# Patient Record
Sex: Male | Born: 1935 | Race: White | Hispanic: No | Marital: Married | Smoking: Never smoker
Health system: Southern US, Community
[De-identification: ages and names within clinical notes are randomized; demographics above are authoritative.]

## PROBLEM LIST (undated history)

## (undated) DIAGNOSIS — C801 Malignant (primary) neoplasm, unspecified: Secondary | ICD-10-CM

## (undated) DIAGNOSIS — I10 Essential (primary) hypertension: Secondary | ICD-10-CM

## (undated) HISTORY — PX: APPENDECTOMY: SHX54

---

## 2000-08-11 ENCOUNTER — Encounter (HOSPITAL_COMMUNITY): Admission: RE | Admit: 2000-08-11 | Discharge: 2000-11-09 | Payer: Self-pay | Admitting: Internal Medicine

## 2001-05-13 ENCOUNTER — Encounter (HOSPITAL_COMMUNITY): Admission: RE | Admit: 2001-05-13 | Discharge: 2001-08-11 | Payer: Self-pay | Admitting: Internal Medicine

## 2002-06-21 ENCOUNTER — Ambulatory Visit (HOSPITAL_COMMUNITY): Admission: RE | Admit: 2002-06-21 | Discharge: 2002-06-21 | Payer: Self-pay | Admitting: *Deleted

## 2002-06-21 ENCOUNTER — Encounter (INDEPENDENT_AMBULATORY_CARE_PROVIDER_SITE_OTHER): Payer: Self-pay | Admitting: *Deleted

## 2003-02-20 ENCOUNTER — Encounter (HOSPITAL_COMMUNITY): Admission: RE | Admit: 2003-02-20 | Discharge: 2003-05-21 | Payer: Self-pay | Admitting: Internal Medicine

## 2003-09-20 ENCOUNTER — Encounter (HOSPITAL_COMMUNITY): Admission: RE | Admit: 2003-09-20 | Discharge: 2003-10-01 | Payer: Self-pay | Admitting: Internal Medicine

## 2004-07-31 ENCOUNTER — Encounter (HOSPITAL_COMMUNITY): Admission: RE | Admit: 2004-07-31 | Discharge: 2004-10-29 | Payer: Self-pay | Admitting: Internal Medicine

## 2004-08-11 ENCOUNTER — Ambulatory Visit (HOSPITAL_COMMUNITY): Admission: RE | Admit: 2004-08-11 | Discharge: 2004-08-11 | Payer: Self-pay | Admitting: *Deleted

## 2004-08-11 ENCOUNTER — Encounter (INDEPENDENT_AMBULATORY_CARE_PROVIDER_SITE_OTHER): Payer: Self-pay | Admitting: *Deleted

## 2006-01-18 ENCOUNTER — Encounter (HOSPITAL_COMMUNITY): Admission: RE | Admit: 2006-01-18 | Discharge: 2006-04-14 | Payer: Self-pay | Admitting: Internal Medicine

## 2006-11-08 ENCOUNTER — Encounter (INDEPENDENT_AMBULATORY_CARE_PROVIDER_SITE_OTHER): Payer: Self-pay | Admitting: *Deleted

## 2006-11-08 ENCOUNTER — Ambulatory Visit (HOSPITAL_COMMUNITY): Admission: RE | Admit: 2006-11-08 | Discharge: 2006-11-08 | Payer: Self-pay | Admitting: *Deleted

## 2009-12-20 ENCOUNTER — Emergency Department (HOSPITAL_COMMUNITY): Admission: EM | Admit: 2009-12-20 | Discharge: 2009-12-20 | Payer: Self-pay | Admitting: Emergency Medicine

## 2010-06-17 NOTE — Op Note (Signed)
Matthew Hickman, BASTOS NO.:  [id number]   MEDICAL RECORD NO.:  [id number]          PATIENT TYPE:  AMB   LOCATION:  ENDO                         FACILITY:  [facility]   PHYSICIAN:  Georgiana Spinner, M.D.    DATE OF BIRTH:  1925/05/10   DATE OF PROCEDURE:  11/08/2006  DATE OF DISCHARGE:                               OPERATIVE REPORT   PROCEDURE:  Colonoscopy.   INDICATIONS:  Colon polyps.   ANESTHESIA:  1. Fentanyl 100 mcg.  2. Versed 10 mg.   PROCEDURE:  With the patient mildly sedated in the left lateral  decubitus position, a rectal examination was performed which was  unremarkable.  Subsequently, the Pentax videoscopic colonoscope was  inserted into the rectum, passed under direct vision to the cecum, with  pressure applied.  The cecum was identified by ileocecal valve and  appendiceal orifice, both of which were photographed.  From this point,  the colonoscope was slowly withdrawn, taking circumferential views of  the colonic mucosa, as we withdrew all the way to the rectum, stopping  at the splenic flexure, where a polyp was seen and removed first using  hot biopsy forceps technique setting of 20/150 blended current.  We next  stopped in the descending colon, where a second polyp was seen.  It too  was photographed, and it was removed using snare cautery technique with  the same setting.  Tissue was suctioned into the endoscope for retrieval  and retrieved in a tissue trap.  The endoscope was then withdrawn as  noted all the way to the rectum, which appeared normal on direct and  showed hemorrhoids on retroflexed view.  The endoscope was straightened  and withdrawn.  The patient's vital signs and pulse oximeter remained  stable.  The patient tolerated the procedure well, without apparent  complications.   FINDINGS:  Internal hemorrhoids.  Polyps of splenic flexure and  descending colon.  Otherwise an unremarkable exam.   PLAN:  Await biopsy report.  The  patient will call me for results and  follow up with me as an outpatient.           ______________________________  Georgiana Spinner, M.D.     GMO/MEDQ  D:  11/08/2006  T:  11/08/2006  Job:  [id number]

## 2010-06-20 NOTE — Op Note (Signed)
Matthew Hickman, KINER NO.:  [id number]   MEDICAL RECORD NO.:  [id number]          PATIENT TYPE:  AMB   LOCATION:  ENDO                         FACILITY:  [facility]   PHYSICIAN:  Georgiana Spinner, M.D.    DATE OF BIRTH:  Jun 24, 1925   DATE OF PROCEDURE:  08/11/2004  DATE OF DISCHARGE:                                 OPERATIVE REPORT   PROCEDURE:  Colonoscopy.   INDICATIONS:  Colon polyp.   ANESTHESIA:  Demerol 70 mg, Versed 4 mg.   PROCEDURE:  With the patient mildly sedated in the left lateral decubitus  position, a rectal exam was performed which was unremarkable.  Subsequently,  the Olympus videoscopic colonoscope was inserted in the rectum and passed  under direct vision to the cecum, identified by ileocecal valve and  appendiceal orifice, both of which were photographed.  From this point the  colonoscope was slowly withdrawn taking circumferential views of the colonic  mucosa, stopping only in the rectum, which appeared normal on direct and  retroflexed view other than we stopped at the splenic flexure, where there  was a small polyp seen.  It was photographed and it, too, was removed using  hot biopsy forceps technique, setting of 20/20 blended current.  The  endoscope was withdrawn.  The patient's vital signs and pulse oximetry  remained stable.  The patient tolerated procedure well without apparent  complications.   FINDINGS:  1.  Small polyp at splenic flexure.  2.  Internal hemorrhoids.  3.  Otherwise unremarkable colonoscopic examination.   PLAN:  Have the patient call me for results of biopsy and follow up with me  as an outpatient.       GMO/MEDQ  D:  08/11/2004  T:  08/11/2004  Job:  [id number]

## 2010-06-20 NOTE — Op Note (Signed)
   NAMEKHALEE, MAZO NO.:  [id number]   MEDICAL RECORD NO.:  [id number]                   PATIENT TYPE:  AMB   LOCATION:  ENDO                                 FACILITY:  [facility]   PHYSICIAN:  Georgiana Spinner, M.D.                 DATE OF BIRTH:  05/11/1925   DATE OF PROCEDURE:  06/21/2002  DATE OF DISCHARGE:                                 OPERATIVE REPORT   PROCEDURE:  Colonoscopy with biopsy.   INDICATIONS:  Colon polyps.   ANESTHESIA:  Demerol 20 mg, Versed 2.5 mg.   DESCRIPTION OF PROCEDURE:  With the patient mildly sedated in the left  lateral decubitus position, the Olympus videoscopic colonoscope was inserted  in the rectum after a normal rectal exam, passed under direct vision to the  cecum, identified by the ileocecal valve and appendiceal orifice, both of  which were photographed.  From this point the colonoscope was slowly  withdrawn, taking circumferential views and the entire colonic mucosa  visualized, stopping only first in the right colon, where two small polyps  were seen, photographed, and removed using hot biopsy forceps technique, and  then in the left colon and __________ descending colon, where another polyp  was seen.  It too was photographed and removed using hot biopsy forceps  technique, all on a setting of 20-20 blended current.  The rectum appeared  normal on direct and showed small internal hemorrhoids on retroflexed view.  The endoscope was straightened and withdrawn.  The patient's vital signs and  pulse oximetry remained stable.  The patient tolerated the procedure well  and without any complications.   FINDINGS:  Polyps of the colon as described.   PLAN:  Await biopsy report.  The patient will call me for results and follow  up with me as an outpatient.                                               Georgiana Spinner, M.D.    GMO/MEDQ  D:  06/21/2002  T:  06/21/2002  Job:  [id number]

## 2010-06-20 NOTE — Op Note (Signed)
   Matthew Hickman, HILTZ NO.:  [id number]   MEDICAL RECORD NO.:  [id number]                   PATIENT TYPE:  AMB   LOCATION:  ENDO                                 FACILITY:  [facility]   PHYSICIAN:  Georgiana Spinner, M.D.                 DATE OF BIRTH:  Nov 24, 1925   DATE OF PROCEDURE:  06/21/2002  DATE OF DISCHARGE:                                 OPERATIVE REPORT   PROCEDURE:  Upper endoscopy.   INDICATIONS FOR PROCEDURE:  Gastroesophageal reflux disease.   ANESTHESIA:  Demerol 70, Versed 7.5 mg.   DESCRIPTION OF PROCEDURE:  With the patient mildly sedated in the left  lateral decubitus position, the Olympus videoscopic endoscope was inserted  in the mouth and passed under direct vision through the esophagus which  appeared normal into the stomach. The fundus and body appeared normal. The  antrum showed changes of erythema and thickened folds in the prepyloric area  which were photographed and biopsied. The duodenal bulb and second portion  of the duodenum appeared normal. From this point, the endoscope was slowly  withdrawn taking circumferential views of the duodenal mucosa until the  endoscope was then pulled back in the stomach, placed in retroflexion to  view the stomach from below. The endoscope was then straightened and  withdrawn taking circumferential views of the remaining gastric and  esophageal mucosa. The patient's vital signs and pulse oximeter remained  stable. The patient tolerated the procedure well without apparent  complications.   FINDINGS:  Thickened folds of the antrum. Await biopsy report. The patient  will call me for results and followup with me as an outpatient. Proceed to  colonoscopy as planned.                                               Georgiana Spinner, M.D.    GMO/MEDQ  D:  06/21/2002  T:  06/21/2002  Job:  [id number]

## 2010-07-21 ENCOUNTER — Other Ambulatory Visit: Payer: Self-pay | Admitting: Dermatology

## 2010-09-17 ENCOUNTER — Emergency Department (HOSPITAL_COMMUNITY): Payer: Medicare Other

## 2010-09-17 ENCOUNTER — Other Ambulatory Visit (INDEPENDENT_AMBULATORY_CARE_PROVIDER_SITE_OTHER): Payer: Self-pay | Admitting: Surgery

## 2010-09-17 ENCOUNTER — Encounter (HOSPITAL_COMMUNITY): Payer: Self-pay | Admitting: Radiology

## 2010-09-17 ENCOUNTER — Inpatient Hospital Stay (HOSPITAL_COMMUNITY)
Admission: EM | Admit: 2010-09-17 | Discharge: 2010-09-18 | DRG: 343 | Disposition: A | Discharge status: Home / Self Care | Payer: Medicare Other | Attending: Surgery | Admitting: Surgery

## 2010-09-17 DIAGNOSIS — N400 Enlarged prostate without lower urinary tract symptoms: Secondary | ICD-10-CM | POA: Diagnosis present

## 2010-09-17 DIAGNOSIS — N289 Disorder of kidney and ureter, unspecified: Secondary | ICD-10-CM | POA: Diagnosis present

## 2010-09-17 DIAGNOSIS — I10 Essential (primary) hypertension: Secondary | ICD-10-CM | POA: Diagnosis present

## 2010-09-17 DIAGNOSIS — M47812 Spondylosis without myelopathy or radiculopathy, cervical region: Secondary | ICD-10-CM | POA: Diagnosis present

## 2010-09-17 DIAGNOSIS — Z7982 Long term (current) use of aspirin: Secondary | ICD-10-CM

## 2010-09-17 DIAGNOSIS — F1010 Alcohol abuse, uncomplicated: Secondary | ICD-10-CM | POA: Diagnosis present

## 2010-09-17 DIAGNOSIS — Z79899 Other long term (current) drug therapy: Secondary | ICD-10-CM

## 2010-09-17 DIAGNOSIS — R000 Tachycardia, unspecified: Secondary | ICD-10-CM | POA: Diagnosis present

## 2010-09-17 DIAGNOSIS — R1031 Right lower quadrant pain: Secondary | ICD-10-CM

## 2010-09-17 DIAGNOSIS — K3580 Unspecified acute appendicitis: Secondary | ICD-10-CM

## 2010-09-17 HISTORY — DX: Essential (primary) hypertension: I10

## 2010-09-17 LAB — URINE MICROSCOPIC-ADD ON

## 2010-09-17 LAB — COMPREHENSIVE METABOLIC PANEL
ALT: 22 U/L (ref 0–53)
AST: 28 U/L (ref 0–37)
Albumin: 3.5 g/dL (ref 3.5–5.2)
Alkaline Phosphatase: 78 U/L (ref 39–117)
BUN: 17 mg/dL (ref 6–23)
CO2: 28 mEq/L (ref 19–32)
Calcium: 9.5 mg/dL (ref 8.4–10.5)
Chloride: 96 mEq/L (ref 96–112)
Creatinine, Ser: 0.62 mg/dL (ref 0.50–1.35)
GFR calc Af Amer: >60 mL/min (ref >60)
GFR calc non Af Amer: >60 mL/min (ref >60)
Glucose, Bld: 127 mg/dL — ABNORMAL HIGH (ref 70–99)
Potassium: 3.8 mEq/L (ref 3.5–5.1)
Sodium: 134 mEq/L — ABNORMAL LOW (ref 135–145)
Total Bilirubin: 1 mg/dL (ref 0.3–1.2)
Total Protein: 6.8 g/dL (ref 6.0–8.3)

## 2010-09-17 LAB — DIFFERENTIAL
Basophils Absolute: 0 10*3/uL (ref 0.0–0.1)
Basophils Relative: 0 % (ref 0–1)
Eosinophils Absolute: 0 10*3/uL (ref 0.0–0.7)
Eosinophils Relative: 0 % (ref 0–5)
Lymphocytes Relative: 8 % — ABNORMAL LOW (ref 12–46)
Lymphs Abs: 1 10*3/uL (ref 0.7–4.0)
Monocytes Absolute: 1.3 10*3/uL — ABNORMAL HIGH (ref 0.1–1.0)
Monocytes Relative: 11 % (ref 3–12)
Neutro Abs: 9.5 10*3/uL — ABNORMAL HIGH (ref 1.7–7.7)
Neutrophils Relative %: 81 % — ABNORMAL HIGH (ref 43–77)

## 2010-09-17 LAB — CBC
HCT: 50.4 % (ref 39.0–52.0)
Hemoglobin: 18.2 g/dL — ABNORMAL HIGH (ref 13.0–17.0)
MCH: 35.6 pg — ABNORMAL HIGH (ref 26.0–34.0)
MCHC: 36.1 g/dL — ABNORMAL HIGH (ref 30.0–36.0)
MCV: 98.6 fL (ref 78.0–100.0)
Platelets: 101 10*3/uL — ABNORMAL LOW (ref 150–400)
RBC: 5.11 MIL/uL (ref 4.22–5.81)
RDW: 13.7 % (ref 11.5–15.5)
WBC: 11.8 10*3/uL — ABNORMAL HIGH (ref 4.0–10.5)

## 2010-09-17 LAB — URINALYSIS, ROUTINE W REFLEX MICROSCOPIC
Glucose, UA: NEGATIVE mg/dL
Ketones, ur: >80 mg/dL — AB
Nitrite: NEGATIVE
Protein, ur: NEGATIVE mg/dL
Specific Gravity, Urine: 1.022 (ref 1.005–1.030)
Urobilinogen, UA: 0.2 mg/dL (ref 0.0–1.0)
pH: 5.5 (ref 5.0–8.0)

## 2010-09-17 LAB — LACTIC ACID, PLASMA: Lactic Acid, Venous: 1.3 mmol/L (ref 0.5–2.2)

## 2010-09-17 LAB — LIPASE, BLOOD: Lipase: 20 U/L (ref 11–59)

## 2010-09-17 MED ORDER — IOHEXOL 300 MG/ML  SOLN
100.0000 mL | Freq: Once | INTRAMUSCULAR | Status: AC | PRN
  Administered 2010-09-17: 100 mL via INTRAVENOUS

## 2010-09-20 ENCOUNTER — Emergency Department (HOSPITAL_COMMUNITY)
Admission: EM | Admit: 2010-09-20 | Discharge: 2010-09-20 | Disposition: A | Discharge status: Home / Self Care | Payer: Medicare Other | Attending: Emergency Medicine | Admitting: Emergency Medicine

## 2010-09-20 DIAGNOSIS — N390 Urinary tract infection, site not specified: Secondary | ICD-10-CM | POA: Insufficient documentation

## 2010-09-20 DIAGNOSIS — Z9089 Acquired absence of other organs: Secondary | ICD-10-CM | POA: Insufficient documentation

## 2010-09-20 DIAGNOSIS — R339 Retention of urine, unspecified: Secondary | ICD-10-CM | POA: Insufficient documentation

## 2010-09-20 LAB — POCT I-STAT, CHEM 8
BUN: 20 mg/dL (ref 6–23)
Calcium, Ion: 1.16 mmol/L (ref 1.12–1.32)
Chloride: 101 mEq/L (ref 96–112)
Creatinine, Ser: 0.7 mg/dL (ref 0.50–1.35)
Glucose, Bld: 102 mg/dL — ABNORMAL HIGH (ref 70–99)
HCT: 50 % (ref 39.0–52.0)
Hemoglobin: 17 g/dL (ref 13.0–17.0)
Potassium: 3.5 mEq/L (ref 3.5–5.1)
Sodium: 139 mEq/L (ref 135–145)
TCO2: 28 mmol/L (ref 0–100)

## 2010-09-20 LAB — URINALYSIS, ROUTINE W REFLEX MICROSCOPIC
Bilirubin Urine: NEGATIVE
Glucose, UA: NEGATIVE mg/dL
Hgb urine dipstick: NEGATIVE
Ketones, ur: NEGATIVE mg/dL
Nitrite: POSITIVE — AB
Protein, ur: NEGATIVE mg/dL
Specific Gravity, Urine: 1.015 (ref 1.005–1.030)
Urobilinogen, UA: 1 mg/dL (ref 0.0–1.0)
pH: 7 (ref 5.0–8.0)

## 2010-09-20 LAB — URINE MICROSCOPIC-ADD ON

## 2010-09-21 LAB — URINE CULTURE
Colony Count: NO GROWTH
Culture  Setup Time: 201208181733
Culture: NO GROWTH

## 2010-09-23 NOTE — Op Note (Signed)
[name], [name] NO.:  [id number]  MEDICAL RECORD NO.:  [id number]  LOCATION:  1523                         FACILITY:  [facility]  PHYSICIAN:  Matthew Sportsman, MD     DATE OF BIRTH:  05/26/1925  DATE OF PROCEDURE:  09/17/2010 DATE OF DISCHARGE:                              OPERATIVE REPORT   PRIMARY CARE PHYSICIAN:  Massie Maroon, MD  SURGEON:  Matthew Sportsman, MD  ASSISTANT:  RN  PREOPERATIVE DIAGNOSES:  Acute appendicitis.  POSTOPERATIVE DIAGNOSES:  Acute suppurative appendicitis, probably early perforated.  PROCEDURE PERFORMED:  Diagnostic laparoscopy with appendectomy.  ANESTHESIA: 1. General. 2. Local anesthetic and a field block around port sites.  SPECIMENS:  Appendix.  DRAINS:  None.  ESTIMATED BLOOD LOSS:  Minimal.  COMPLICATIONS:  None apparent.  INDICATION:  Matthew Hickman is an 77 year old heavy drinker with 24-hour history of abdominal pain, it began more progressive in right lower quadrant with nausea.  History and physical, and CT scan findings were strongly suspicious for acute appendicitis.  The anatomy and physiology of the digestive tract was explained.  The pathophysiology of acute appendicitis was discussed.  Options were discussed.  Recommendation was made for diagnostic laparoscopy with appendectomy.  Laparoscopic verus open was discussed.  Risks, benefits, and alternatives were discussed.  Natural history and risks without surgery were discussed as well.  Questions were answered and he agreed to proceed.  OPERATIVE FINDINGS:  He had a moderately inflamed appendix that was diffusely thickened with separation and phlegmon on it.  There was no discrete abscesses.  Rest of his small intestine otherwise looked normal with no obvious terminal ileitis or colon or pelvic abnormalities.  No obvious hernia.  DESCRIPTION OF PROCEDURE:  Informed consent was confirmed.  The patient had already intravenous Invanz.  He underwent general  anesthesia without any difficulty.  He was positioned supine with arms tucked.  He had voided prior to coming to the operating room.  His abdomen was clipped, prepped, and draped in sterile fashion.  Surgical time out confirmed our plan.  I placed a #5 mm port through the inferior part of the umbilicus doing a cutdown in modified Hasson technique.  Entry was clean.  I induced carbon dioxide insufflation.  Camera inspection revealed no intraabdominal injury.  I placed a 5 mm port in the left suprapubic region.  I rolled over the cecum and saw an inflamed appendix running along the pericolic gutter over the pelvic brim.  I upsized the umbilical port to 12-mm port and placed a 5 mm port in the left lower quadrant.  I elevated the appendix, I was able to mobilize it over the cephalad.  I made a window at the mesentry of the mesoappendix close to the epiphyseal base.  I stapled the appendix of the cecum using laparoscopic blue load stapler and ligated the mesoappendix using 0-Endoloop PDS.  I then transected the appendix and also ligated mesoappendix using cautery tip scissors to get it resolved.  Because the appendix was rather inflamed and phlegmonous, I placed an Endocatch bag and removed out the umbilical port.  I did copious irrigation over 3 liter saline in subhepatic space,  pelvis, and especially along the paracolic gutter.  Aspiration was clear at the end.  Hemostasis was excellent.  Staple line was clean with no bleeding.  Therefore, I evacuated carbon dioxide and removed the ports.  I closed the wounds using 0-Vicryl fascial stitches at the umbilicus and closed the skin using 4-0 Monocryl stitch.  I placed sterile dressing.  The patient is being extubated to recovery room.  I discussed the postoperative care with the patient in the ER prior to surgery.  Family was not available for surgery.  I will look for them now.     Matthew Sportsman, MD     SCG/MEDQ  D:   09/17/2010  T:  09/18/2010  Job:  [id number]  cc:   Massie Maroon, MD Fax: [phone]  Electronically Signed by Karie Soda MD on 09/23/2010 01:55:22 PM

## 2010-10-02 NOTE — Discharge Summary (Signed)
  Matthew Hickman, Matthew Hickman NO.:  [id number]  MEDICAL RECORD NO.:  [id number]  LOCATION:  1523                         FACILITY:  [facility]  PHYSICIAN:  Ardeth Sportsman, MD     DATE OF BIRTH:  Jul 20, 1925  DATE OF ADMISSION:  09/17/2010 DATE OF DISCHARGE:  09/18/2010                              DISCHARGE SUMMARY   PRIMARY CARE PHYSICIAN:  Massie Maroon, MD  GENERAL SURGEON:  Ardeth Sportsman, MD  HISTORY OF PRESENT ILLNESS:  Matthew Hickman is a pleasant 77 year old gentleman with history of hypertension and alcohol abuse who presented with abdominal pain, was found to have evidence of appendicitis on CT scan.  Incidental finding on the CT showed a small indeterminate 9 mm lesion emanating from the lower pole of the left kidney, again of uncertain etiology.  The patient does have a prominent prostate on the CT as well.  However, after evaluation of the patient, decision was made to admit the patient for surgical intervention.  SUMMARY OF HOSPITAL COURSE:  The patient was admitted on September 17, 2010, was taken to the operating room on same day, underwent laparoscopic appendectomy.  Postoperatively, the patient is voiding okay, passing flatus, and has tolerated regular diet without any significant nausea, vomiting.  His pain control is good with oral analgesics.  We feel he is appropriate for discharge at this time.  DISCHARGE DIAGNOSES: 1. Acute appendicitis, status post laparoscopic appendectomy. 2. Hypertension. 3. Chronic alcohol abuse. 4. Probable prostatic hypertrophy. 5. Left kidney lesion found on CT.  DISCHARGE MEDICATIONS:  The patient can resume home medications including: 1. Aspirin 81 mg daily. 2. Hydrochlorothiazide 25 mg daily. 3. Metoprolol 25 mg 2 tablets q.a.m. 4. Multivitamin once daily. 5. Prescription for Norco 5/325 one to two tablets q.4-6 hours p.r.n.     pain.  He is asked to follow up in our clinic for reevaluation in approximately 2 weeks'  time.  He is asked to follow up with his primary care physician, Dr. Selena Batten, for his routinely scheduled annual physical.     [name], PA-C   ______________________________ Ardeth Sportsman, MD    KB/MEDQ  D:  09/18/2010  T:  09/19/2010  Job:  [id number]  cc:   Massie Maroon, MD Fax: [phone]  Electronically Signed by [name]  on 09/29/2010 11:56:10 AM Electronically Signed by Karie Soda MD on 10/02/2010 08:17:31 AM

## 2010-10-02 NOTE — H&P (Signed)
[name], Hickman NO.:  [id number]  MEDICAL RECORD NO.:  [id number]  LOCATION:  0098                         FACILITY:  [facility]  PHYSICIAN:  Ardeth Sportsman, MD     DATE OF BIRTH:  May 16, 1925  DATE OF ADMISSION:  09/17/2010 DATE OF DISCHARGE:                             HISTORY & PHYSICAL   CHIEF COMPLAINT:  Abdominal pain.  BRIEF HISTORY:  The patient is a healthy 77 year old white male who developed pain in his right lower quadrant last night.  He could not sleep, it kept him up all night.  No nausea, vomiting, diarrhea, or constipation.  No blood in his stool.  No fever.  He presented to the ER, still having pain.  He has been treated with morphine for his pain and that is under control.  Workup included a CT, which shows atelectasis in the right lower lobe.  There is a 9-mm lesion in lower pole of the left kidney.  The appendix is thickened at 11 mm with periappendiceal stranding consistent with acute appendicitis.  His prostate is also 62 x 53 mm.  We are asked to see in consultation.  PAST MEDICAL HISTORY: 1. He has a x-ray, which shows cervical spondylosis, November 2011. 2. High blood pressure for 26 years. 3. He is hard of hearing and this was attributed to a blood clot some     years ago, which affected his hearing.  PAST SURGICAL HISTORY: 1. He has had a vasectomy. 2. He has had several endoscopies and the last in 2008, by Dr. Virginia Rochester was     essentially normal.  FAMILY HISTORY:  Father died at 33 with congestive heart failure. Mother, poor health with a history of MI at 60 and 2 brothers, one down with lung cancer and one with brain cancer.  Two sisters with cancer and one in good health.  SOCIAL HISTORY:  He has never smoked.  Alcohol 1.75 L of bourbon per week for the last 14 years.  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  CURRENT MEDICATIONS: 1. Hydrochlorothiazide 25 mg daily. 2. Metoprolol 25 mg p.o. b.i.d.  REVIEW  OF SYSTEMS:  CV:  Negative for headache, dizziness, syncope, stroke, or seizure.  PULMONARY:  Positive for some dyspnea on exertion. No coughing, wheezing.  No URIs.  No history of orthopnea or PND.  GI: Negative for nausea, vomiting, diarrhea, constipation, or blood. CARDIAC:  No history of chest pain or palpitations.  No history of heart failure.  GU:  Little slow voiding sometimes, otherwise normal.  LOWER EXTREMITIES:  Positive for occasional edema in his feet.  He has pain in his feet, but not in his legs.  SKIN:  He has had skin cancers removed from his face.  ENDOCRINE:  Negative.  PSYCHIATRIC:  Negative.  ALLERGIES:  None known.  He says he does not tolerate GINGER ALE.  PHYSICAL EXAMINATION:  GENERAL:  This is a well-nourished, well- developed white male, in no acute distress. VITAL SIGNS:  Temperature recorded as 98.3, he feels warmer than that now, heart rate was 126 on admission, blood pressure 150/82, respiratory rate is 18, saturation is 97%  on room air. HEENT:  Head:  Normocephalic.  Ears:  Extremely hard-of-hearing.  Nose, throat, and mouth mucosa is normal.  Trachea is midline.  Thyroid is nonpalpable.  No JVD.  No bruits. CHEST:  Clear.  Upper lobes, he has bilateral basilar rales. CARDIAC:  Normal S1 and S2.  No murmurs or rub were heard.  Chest wall is nontender. ABDOMEN:  Bowel sounds were present.  He is slightly distended, tender most especially in the right lower quadrant.  No hernias, masses, or abscesses noted. GU/RECTAL:  Deferred. MUSCULOSKELETAL:  No changes. SKIN:  No changes. NEUROLOGIC:  Cranial nerves II through XII grossly intact.  No focal changes. PSYCHIATRIC:  Normal affect.  LABORATORY DATA:  White count 11.8, hemoglobin is 18, hematocrit is 50, and platelets are 101,000.  UA is positive for ketones, 11-20 white cells per high-powered field, lactic acid 1.3, and lipase 20.  Sodium of 134, potassium 3.8, chloride 96, CO2 of 28, BUN is 17,  creatinine is 0.62, and glucose 127.  EKG shows sinus rhythm, heart rate of 120.  PR interval 162 milliseconds.  QTc of 432 milliseconds with occasional PVCs.  CT as described above.  IMPRESSION: 1. Acute appendicitis. 2. Hypertension. 3. Alcohol use. 4. Probable urinary tract infection.  Questionable abnormality left     lower pole of the kidney.  PLAN:  We are going to take him to the operating room for appendectomy as soon as he is back from x-ray.  Further workup and treatment as indicated.     Eber Hong, P.A.   ______________________________ Ardeth Sportsman, MD    WDJ/MEDQ  D:  09/17/2010  T:  09/17/2010  Job:  [id number]  cc:   Eber Hong, P.A.  Electronically Signed by Sherrie Aviv P.A. on 09/25/2010 09:08:45 PM Electronically Signed by Karie Soda MD on 10/02/2010 08:17:24 AM

## 2010-10-07 ENCOUNTER — Ambulatory Visit (INDEPENDENT_AMBULATORY_CARE_PROVIDER_SITE_OTHER): Payer: Medicare Other | Admitting: General Surgery

## 2010-10-07 ENCOUNTER — Encounter (INDEPENDENT_AMBULATORY_CARE_PROVIDER_SITE_OTHER): Payer: Self-pay | Admitting: General Surgery

## 2010-10-07 VITALS — BP 134/82

## 2010-10-07 DIAGNOSIS — Z9889 Other specified postprocedural states: Secondary | ICD-10-CM

## 2010-10-07 DIAGNOSIS — Z9049 Acquired absence of other specified parts of digestive tract: Secondary | ICD-10-CM

## 2010-10-07 NOTE — Progress Notes (Signed)
History of Present Illness: Matthew Hickman is a  77 y.o. male who presents today status post lap appy.  Pathology reveals acute appendicitis.  The patient is tolerating a regular diet, having normal bowel movements, has good pain control.  He  is back to most normal activities.   Physical Exam: Abd: soft, nontender, active bowel sounds, nondistended.  All incisions are well healed.  Impression: 1.  Acute appendicitis, s/p lap appy  Plan: He  is able to return to normal activities. He  may follow up on a prn basis.

## 2010-10-09 ENCOUNTER — Encounter (INDEPENDENT_AMBULATORY_CARE_PROVIDER_SITE_OTHER): Payer: Medicare Other | Admitting: Surgery

## 2011-03-18 DIAGNOSIS — N401 Enlarged prostate with lower urinary tract symptoms: Secondary | ICD-10-CM | POA: Diagnosis not present

## 2011-03-18 DIAGNOSIS — R339 Retention of urine, unspecified: Secondary | ICD-10-CM | POA: Diagnosis not present

## 2011-03-18 DIAGNOSIS — N281 Cyst of kidney, acquired: Secondary | ICD-10-CM | POA: Diagnosis not present

## 2011-08-17 ENCOUNTER — Other Ambulatory Visit: Payer: Self-pay | Admitting: Surgery

## 2011-08-17 DIAGNOSIS — C44320 Squamous cell carcinoma of skin of unspecified parts of face: Secondary | ICD-10-CM | POA: Diagnosis not present

## 2011-09-10 DIAGNOSIS — I10 Essential (primary) hypertension: Secondary | ICD-10-CM | POA: Diagnosis not present

## 2011-09-10 DIAGNOSIS — R972 Elevated prostate specific antigen [PSA]: Secondary | ICD-10-CM | POA: Diagnosis not present

## 2011-09-10 DIAGNOSIS — D45 Polycythemia vera: Secondary | ICD-10-CM | POA: Diagnosis not present

## 2011-09-10 DIAGNOSIS — D649 Anemia, unspecified: Secondary | ICD-10-CM | POA: Diagnosis not present

## 2011-09-10 DIAGNOSIS — C4490 Unspecified malignant neoplasm of skin, unspecified: Secondary | ICD-10-CM | POA: Diagnosis not present

## 2011-09-24 DIAGNOSIS — D485 Neoplasm of uncertain behavior of skin: Secondary | ICD-10-CM | POA: Diagnosis not present

## 2011-09-24 DIAGNOSIS — R238 Other skin changes: Secondary | ICD-10-CM | POA: Diagnosis not present

## 2011-09-24 DIAGNOSIS — C44320 Squamous cell carcinoma of skin of unspecified parts of face: Secondary | ICD-10-CM | POA: Diagnosis not present

## 2011-11-05 DIAGNOSIS — Z23 Encounter for immunization: Secondary | ICD-10-CM | POA: Diagnosis not present

## 2012-03-01 DIAGNOSIS — Z961 Presence of intraocular lens: Secondary | ICD-10-CM | POA: Diagnosis not present

## 2012-03-07 DIAGNOSIS — I10 Essential (primary) hypertension: Secondary | ICD-10-CM | POA: Diagnosis not present

## 2012-03-14 DIAGNOSIS — D45 Polycythemia vera: Secondary | ICD-10-CM | POA: Diagnosis not present

## 2012-03-14 DIAGNOSIS — R972 Elevated prostate specific antigen [PSA]: Secondary | ICD-10-CM | POA: Diagnosis not present

## 2012-03-14 DIAGNOSIS — I10 Essential (primary) hypertension: Secondary | ICD-10-CM | POA: Diagnosis not present

## 2012-03-21 DIAGNOSIS — N401 Enlarged prostate with lower urinary tract symptoms: Secondary | ICD-10-CM | POA: Diagnosis not present

## 2012-03-21 DIAGNOSIS — N390 Urinary tract infection, site not specified: Secondary | ICD-10-CM | POA: Diagnosis not present

## 2012-09-12 DIAGNOSIS — D45 Polycythemia vera: Secondary | ICD-10-CM | POA: Diagnosis not present

## 2012-09-12 DIAGNOSIS — I10 Essential (primary) hypertension: Secondary | ICD-10-CM | POA: Diagnosis not present

## 2012-09-19 DIAGNOSIS — D45 Polycythemia vera: Secondary | ICD-10-CM | POA: Diagnosis not present

## 2012-09-19 DIAGNOSIS — I10 Essential (primary) hypertension: Secondary | ICD-10-CM | POA: Diagnosis not present

## 2012-09-19 DIAGNOSIS — R972 Elevated prostate specific antigen [PSA]: Secondary | ICD-10-CM | POA: Diagnosis not present

## 2012-09-19 DIAGNOSIS — C4490 Unspecified malignant neoplasm of skin, unspecified: Secondary | ICD-10-CM | POA: Diagnosis not present

## 2012-10-18 DIAGNOSIS — Z23 Encounter for immunization: Secondary | ICD-10-CM | POA: Diagnosis not present

## 2013-01-09 DIAGNOSIS — L821 Other seborrheic keratosis: Secondary | ICD-10-CM | POA: Diagnosis not present

## 2013-01-09 DIAGNOSIS — D2330 Other benign neoplasm of skin of unspecified part of face: Secondary | ICD-10-CM | POA: Diagnosis not present

## 2013-01-09 DIAGNOSIS — D485 Neoplasm of uncertain behavior of skin: Secondary | ICD-10-CM | POA: Diagnosis not present

## 2013-01-09 DIAGNOSIS — Z85828 Personal history of other malignant neoplasm of skin: Secondary | ICD-10-CM | POA: Diagnosis not present

## 2013-06-05 DIAGNOSIS — N139 Obstructive and reflux uropathy, unspecified: Secondary | ICD-10-CM | POA: Diagnosis not present

## 2013-06-05 DIAGNOSIS — N281 Cyst of kidney, acquired: Secondary | ICD-10-CM | POA: Diagnosis not present

## 2013-06-05 DIAGNOSIS — N401 Enlarged prostate with lower urinary tract symptoms: Secondary | ICD-10-CM | POA: Diagnosis not present

## 2013-06-05 DIAGNOSIS — D4959 Neoplasm of unspecified behavior of other genitourinary organ: Secondary | ICD-10-CM | POA: Diagnosis not present

## 2013-09-14 DIAGNOSIS — R972 Elevated prostate specific antigen [PSA]: Secondary | ICD-10-CM | POA: Diagnosis not present

## 2013-09-14 DIAGNOSIS — R5381 Other malaise: Secondary | ICD-10-CM | POA: Diagnosis not present

## 2013-09-14 DIAGNOSIS — I10 Essential (primary) hypertension: Secondary | ICD-10-CM | POA: Diagnosis not present

## 2013-09-14 DIAGNOSIS — R5383 Other fatigue: Secondary | ICD-10-CM | POA: Diagnosis not present

## 2013-10-21 DIAGNOSIS — Z23 Encounter for immunization: Secondary | ICD-10-CM | POA: Diagnosis not present

## 2013-11-22 DIAGNOSIS — R972 Elevated prostate specific antigen [PSA]: Secondary | ICD-10-CM | POA: Diagnosis not present

## 2013-11-22 DIAGNOSIS — I10 Essential (primary) hypertension: Secondary | ICD-10-CM | POA: Diagnosis not present

## 2013-11-22 DIAGNOSIS — D45 Polycythemia vera: Secondary | ICD-10-CM | POA: Diagnosis not present

## 2014-09-17 DIAGNOSIS — D45 Polycythemia vera: Secondary | ICD-10-CM | POA: Diagnosis not present

## 2014-09-17 DIAGNOSIS — R972 Elevated prostate specific antigen [PSA]: Secondary | ICD-10-CM | POA: Diagnosis not present

## 2014-09-17 DIAGNOSIS — I10 Essential (primary) hypertension: Secondary | ICD-10-CM | POA: Diagnosis not present

## 2014-09-20 DIAGNOSIS — R972 Elevated prostate specific antigen [PSA]: Secondary | ICD-10-CM | POA: Diagnosis not present

## 2014-09-20 DIAGNOSIS — I10 Essential (primary) hypertension: Secondary | ICD-10-CM | POA: Diagnosis not present

## 2014-09-20 DIAGNOSIS — D45 Polycythemia vera: Secondary | ICD-10-CM | POA: Diagnosis not present

## 2014-09-25 ENCOUNTER — Emergency Department (HOSPITAL_COMMUNITY)
Admission: EM | Admit: 2014-09-25 | Discharge: 2014-09-25 | Disposition: A | Discharge status: Home / Self Care | Payer: Medicare Other | Attending: Emergency Medicine | Admitting: Emergency Medicine

## 2014-09-25 ENCOUNTER — Encounter (HOSPITAL_COMMUNITY): Payer: Self-pay | Admitting: *Deleted

## 2014-09-25 ENCOUNTER — Emergency Department (HOSPITAL_COMMUNITY): Payer: Medicare Other

## 2014-09-25 DIAGNOSIS — I10 Essential (primary) hypertension: Secondary | ICD-10-CM | POA: Diagnosis not present

## 2014-09-25 DIAGNOSIS — Y998 Other external cause status: Secondary | ICD-10-CM | POA: Diagnosis not present

## 2014-09-25 DIAGNOSIS — Y9289 Other specified places as the place of occurrence of the external cause: Secondary | ICD-10-CM | POA: Insufficient documentation

## 2014-09-25 DIAGNOSIS — W1839XA Other fall on same level, initial encounter: Secondary | ICD-10-CM | POA: Diagnosis not present

## 2014-09-25 DIAGNOSIS — Z79899 Other long term (current) drug therapy: Secondary | ICD-10-CM | POA: Insufficient documentation

## 2014-09-25 DIAGNOSIS — M545 Low back pain, unspecified: Secondary | ICD-10-CM

## 2014-09-25 DIAGNOSIS — S79911A Unspecified injury of right hip, initial encounter: Secondary | ICD-10-CM | POA: Diagnosis present

## 2014-09-25 DIAGNOSIS — Z7982 Long term (current) use of aspirin: Secondary | ICD-10-CM | POA: Diagnosis not present

## 2014-09-25 DIAGNOSIS — Y9389 Activity, other specified: Secondary | ICD-10-CM | POA: Insufficient documentation

## 2014-09-25 DIAGNOSIS — M25551 Pain in right hip: Secondary | ICD-10-CM | POA: Diagnosis not present

## 2014-09-25 DIAGNOSIS — S3992XA Unspecified injury of lower back, initial encounter: Secondary | ICD-10-CM | POA: Diagnosis not present

## 2014-09-25 NOTE — ED Notes (Signed)
Returned from xray

## 2014-09-25 NOTE — ED Provider Notes (Signed)
CSN: [id number]     Arrival date & time 09/25/14  0917 History   First MD Initiated Contact with Patient 09/25/14 1024     Chief Complaint  Patient presents with  . Hip Pain     (Consider location/radiation/quality/duration/timing/severity/associated sxs/prior Treatment) HPI   Pt p/w right hip/lower back pain that began about 5 days ago.  States he was in his yard crouched under a bush doing yard work when he lost his balance and accidentally tipped over onto his right side, landing primarily on his right elbow.  States his right hip started hurting two days later.  Has taken nothing for the pain.  Decided to have it checked out this morning because he was having difficulty getting out of bed.  Denies fevers, chills, abdominal pain, loss of control of bowel or bladder, weakness of numbness of the extremities, saddle anesthesia, bowel or urinary complaints.    Past Medical History  Diagnosis Date  . Hypertension    Past Surgical History  Procedure Laterality Date  . Appendectomy     No family history on file. Social History  Substance Use Topics  . Smoking status: Never Smoker   . Smokeless tobacco: None  . Alcohol Use: None    Review of Systems  All other systems reviewed and are negative.     Allergies  Review of patient's allergies indicates no known allergies.  Home Medications   Prior to Admission medications   Medication Sig Start Date End Date Taking? Authorizing Provider  aspirin 81 MG tablet Take 81 mg by mouth daily.      Historical Provider, MD  hydrochlorothiazide 25 MG tablet Take 25 mg by mouth daily.      Historical Provider, MD  metoprolol (TOPROL-XL) 50 MG 24 hr tablet Take 50 mg by mouth daily.      Historical Provider, MD  Pediatric Multivitamins-Fl (MULTI VIT/FL PO) Take by mouth.      Historical Provider, MD  silodosin (RAPAFLO) 8 MG CAPS capsule Take 8 mg by mouth daily with breakfast.      Historical Provider, MD   BP 158/92 mmHg  Pulse 80   Temp(Src) 97.6 F (36.4 C) (Oral)  Resp 16  Ht 5\' 11"  (1.803 m)  Wt 142 lb (64.411 kg)  BMI 19.81 kg/m2  SpO2 98% Physical Exam  Constitutional: He appears well-developed and well-nourished. No distress.  HENT:  Head: Normocephalic and atraumatic.  Neck: Neck supple.  Cardiovascular: Normal rate and regular rhythm.   Pulmonary/Chest: Effort normal and breath sounds normal. No respiratory distress. He has no wheezes. He has no rales.  Abdominal: Soft. He exhibits no distension and no mass. There is no tenderness. There is no rebound and no guarding.  Musculoskeletal:       Back:  Spine nontender, no crepitus, or stepoffs. Lower extremities:  Strength 5/5, sensation intact, distal pulses intact.    No other bony tenderness noted throughout right side.    Neurological: He is alert. He exhibits normal muscle tone.  Skin: He is not diaphoretic.  Nursing note and vitals reviewed.   ED Course  Procedures (including critical care time) Labs Review Labs Reviewed - No data to display  Imaging Review Dg Hip Unilat  With Pelvis 2-3 Views Right  09/25/2014   CLINICAL DATA:  Right hip pain for 7 days.  Fell 10 days ago.  EXAM: DG HIP (WITH OR WITHOUT PELVIS) 2-3V RIGHT  COMPARISON:  CT 09/17/2010  FINDINGS: Irregular appearance of the right L5  transverse process appears similar to the prior examination. The pelvic bony ring is intact. Right hip is located without acute fracture.  IMPRESSION: No acute abnormality.   Electronically Signed   By: Markus Daft M.D.   On: 09/25/2014 10:58   I have personally reviewed and evaluated these images and lab results as part of my medical decision-making.   EKG Interpretation None       Discussed pt with Dr Dayna Barker.      MDM   Final diagnoses:  Right-sided low back pain without sciatica    Afebrile, nontoxic patient with right lower back pain after tipping over while crouched on the ground last week.  Reproducible with palpation.  Pt without red  flags or other concerning symptoms.  No requiring pain medications.  Ambulates without difficulty.   D/C home with PCP follow up.  Discussed result, findings, treatment, and follow up  with patient.  Pt given return precautions.  Pt verbalizes understanding and agrees with plan.         Clayton Bibles, PA-C 09/25/14 1253  Merrily Pew, MD 09/28/14 [phone]

## 2014-09-25 NOTE — Discharge Instructions (Signed)
Read the information below.  You may return to the Emergency Department at any time for worsening condition or any new symptoms that concern you.   If you develop fevers, loss of control of bowel or bladder, weakness or numbness in your legs, or are unable to walk, return to the ER for a recheck.    Back Pain, Adult Low back pain is very common. About 1 in 5 people have back pain.The cause of low back pain is rarely dangerous. The pain often gets better over time.About half of people with a sudden onset of back pain feel better in just 2 weeks. About 8 in 10 people feel better by 6 weeks.  CAUSES Some common causes of back pain include:  Strain of the muscles or ligaments supporting the spine.  Wear and tear (degeneration) of the spinal discs.  Arthritis.  Direct injury to the back. DIAGNOSIS Most of the time, the direct cause of low back pain is not known.However, back pain can be treated effectively even when the exact cause of the pain is unknown.Answering your caregiver's questions about your overall health and symptoms is one of the most accurate ways to make sure the cause of your pain is not dangerous. If your caregiver needs more information, he or she may order lab work or imaging tests (X-rays or MRIs).However, even if imaging tests show changes in your back, this usually does not require surgery. HOME CARE INSTRUCTIONS For many people, back pain returns.Since low back pain is rarely dangerous, it is often a condition that people can learn to [facility] their own.   Remain active. It is stressful on the back to sit or stand in one place. Do not sit, drive, or stand in one place for more than 30 minutes at a time. Take short walks on level surfaces as soon as pain allows.Try to increase the length of time you walk each day.  Do not stay in bed.Resting more than 1 or 2 days can delay your recovery.  Do not avoid exercise or work.Your body is made to move.It is not dangerous  to be active, even though your back may hurt.Your back will likely heal faster if you return to being active before your pain is gone.  Pay attention to your body when you bend and lift. Many people have less discomfortwhen lifting if they bend their knees, keep the load close to their bodies,and avoid twisting. Often, the most comfortable positions are those that put less stress on your recovering back.  Find a comfortable position to sleep. Use a firm mattress and lie on your side with your knees slightly bent. If you lie on your back, put a pillow under your knees.  Only take over-the-counter or prescription medicines as directed by your caregiver. Over-the-counter medicines to reduce pain and inflammation are often the most helpful.Your caregiver may prescribe muscle relaxant drugs.These medicines help dull your pain so you can more quickly return to your normal activities and healthy exercise.  Put ice on the injured area.  Put ice in a plastic bag.  Place a towel between your skin and the bag.  Leave the ice on for 15-20 minutes, 03-04 times a day for the first 2 to 3 days. After that, ice and heat may be alternated to reduce pain and spasms.  Ask your caregiver about trying back exercises and gentle massage. This may be of some benefit.  Avoid feeling anxious or stressed.Stress increases muscle tension and can worsen back pain.It is  important to recognize when you are anxious or stressed and learn ways to manage it.Exercise is a great option. SEEK MEDICAL CARE IF:  You have pain that is not relieved with rest or medicine.  You have pain that does not improve in 1 week.  You have new symptoms.  You are generally not feeling well. SEEK IMMEDIATE MEDICAL CARE IF:   You have pain that radiates from your back into your legs.  You develop new bowel or bladder control problems.  You have unusual weakness or numbness in your arms or legs.  You develop nausea or  vomiting.  You develop abdominal pain.  You feel faint. Document Released: 01/19/2005 Document Revised: 07/21/2011 Document Reviewed: 05/23/2013 [facility] Patient Information 2015 [place], [place]. This information is not intended to replace advice given to you by your health care provider. Make sure you discuss any questions you have with your health care provider.

## 2014-09-25 NOTE — ED Notes (Signed)
C/o right hip pain states he fell about a week ago hip hurt a little however it got better. States today hurts to walk is able to walk but painful. Alert oriented.

## 2014-09-25 NOTE — ED Notes (Signed)
Ambulated pt on the unit pt ambulated well no complaints noted at this time 

## 2014-09-25 NOTE — ED Provider Notes (Signed)
Medical screening examination/treatment/procedure(s) were conducted as a shared visit with non-physician practitioner(s) and myself.  I personally evaluated the patient during the encounter.  81 year old male with a minimal mechanism fall a week ago with persistent pain more in his right she'll area. Amulet without difficulty. On exam patient has no pain with range of motion or with load bearing on that extremity but does have pain with palpation of his SI joint. We will get x-ray to make sure is no fracture and if negative and still able to ambulate we'll discharge home.    Merrily Pew, MD 09/25/14 [phone]

## 2014-09-25 NOTE — ED Notes (Addendum)
Pt states that he fell last Tuesday and landed on his rt side pt now reports rt hip pain. Pt stats that pain is made worse with movement. Pt able to move extremity.

## 2014-11-01 DIAGNOSIS — Z23 Encounter for immunization: Secondary | ICD-10-CM | POA: Diagnosis not present

## 2015-09-12 DIAGNOSIS — N390 Urinary tract infection, site not specified: Secondary | ICD-10-CM | POA: Diagnosis not present

## 2015-09-12 DIAGNOSIS — D45 Polycythemia vera: Secondary | ICD-10-CM | POA: Diagnosis not present

## 2015-09-12 DIAGNOSIS — R972 Elevated prostate specific antigen [PSA]: Secondary | ICD-10-CM | POA: Diagnosis not present

## 2015-09-12 DIAGNOSIS — I10 Essential (primary) hypertension: Secondary | ICD-10-CM | POA: Diagnosis not present

## 2015-09-19 DIAGNOSIS — D45 Polycythemia vera: Secondary | ICD-10-CM | POA: Diagnosis not present

## 2015-09-19 DIAGNOSIS — Z23 Encounter for immunization: Secondary | ICD-10-CM | POA: Diagnosis not present

## 2015-09-19 DIAGNOSIS — I10 Essential (primary) hypertension: Secondary | ICD-10-CM | POA: Diagnosis not present

## 2015-09-19 DIAGNOSIS — R739 Hyperglycemia, unspecified: Secondary | ICD-10-CM | POA: Diagnosis not present

## 2015-09-19 DIAGNOSIS — R972 Elevated prostate specific antigen [PSA]: Secondary | ICD-10-CM | POA: Diagnosis not present

## 2015-09-27 DIAGNOSIS — R739 Hyperglycemia, unspecified: Secondary | ICD-10-CM | POA: Diagnosis not present

## 2015-09-27 DIAGNOSIS — Z0000 Encounter for general adult medical examination without abnormal findings: Secondary | ICD-10-CM | POA: Diagnosis not present

## 2015-09-27 DIAGNOSIS — R972 Elevated prostate specific antigen [PSA]: Secondary | ICD-10-CM | POA: Diagnosis not present

## 2015-09-27 DIAGNOSIS — I10 Essential (primary) hypertension: Secondary | ICD-10-CM | POA: Diagnosis not present

## 2015-11-04 DIAGNOSIS — N400 Enlarged prostate without lower urinary tract symptoms: Secondary | ICD-10-CM | POA: Diagnosis not present

## 2015-11-04 DIAGNOSIS — R972 Elevated prostate specific antigen [PSA]: Secondary | ICD-10-CM | POA: Diagnosis not present

## 2016-05-23 ENCOUNTER — Emergency Department (HOSPITAL_COMMUNITY): Payer: Medicare Other

## 2016-05-23 ENCOUNTER — Encounter (HOSPITAL_COMMUNITY): Payer: Self-pay | Admitting: Emergency Medicine

## 2016-05-23 ENCOUNTER — Emergency Department (HOSPITAL_COMMUNITY)
Admission: EM | Admit: 2016-05-23 | Discharge: 2016-05-23 | Disposition: A | Discharge status: Home / Self Care | Payer: Medicare Other | Attending: Emergency Medicine | Admitting: Emergency Medicine

## 2016-05-23 DIAGNOSIS — R319 Hematuria, unspecified: Secondary | ICD-10-CM | POA: Diagnosis not present

## 2016-05-23 DIAGNOSIS — R310 Gross hematuria: Secondary | ICD-10-CM | POA: Diagnosis not present

## 2016-05-23 DIAGNOSIS — Z79899 Other long term (current) drug therapy: Secondary | ICD-10-CM | POA: Diagnosis not present

## 2016-05-23 DIAGNOSIS — I10 Essential (primary) hypertension: Secondary | ICD-10-CM | POA: Diagnosis not present

## 2016-05-23 LAB — CBC WITH DIFFERENTIAL/PLATELET
Basophils Absolute: 0 10*3/uL (ref 0.0–0.1)
Basophils Relative: 0 %
Eosinophils Absolute: 0.1 10*3/uL (ref 0.0–0.7)
Eosinophils Relative: 1 %
HCT: 51.6 % (ref 39.0–52.0)
Hemoglobin: 18.3 g/dL — ABNORMAL HIGH (ref 13.0–17.0)
Lymphocytes Relative: 24 %
Lymphs Abs: 1.3 10*3/uL (ref 0.7–4.0)
MCH: 34.8 pg — ABNORMAL HIGH (ref 26.0–34.0)
MCHC: 35.5 g/dL (ref 30.0–36.0)
MCV: 98.1 fL (ref 78.0–100.0)
Monocytes Absolute: 0.6 10*3/uL (ref 0.1–1.0)
Monocytes Relative: 10 %
Neutro Abs: 3.4 10*3/uL (ref 1.7–7.7)
Neutrophils Relative %: 64 %
Platelets: 106 10*3/uL — ABNORMAL LOW (ref 150–400)
RBC: 5.26 MIL/uL (ref 4.22–5.81)
RDW: 14.3 % (ref 11.5–15.5)
WBC: 5.4 10*3/uL (ref 4.0–10.5)

## 2016-05-23 LAB — URINALYSIS, ROUTINE W REFLEX MICROSCOPIC
Bilirubin Urine: NEGATIVE
Glucose, UA: NEGATIVE mg/dL
Ketones, ur: NEGATIVE mg/dL
Nitrite: NEGATIVE
Protein, ur: NEGATIVE mg/dL
Specific Gravity, Urine: 1.013 (ref 1.005–1.030)
pH: 6 (ref 5.0–8.0)

## 2016-05-23 LAB — BASIC METABOLIC PANEL
Anion gap: 8 (ref 5–15)
BUN: 17 mg/dL (ref 6–20)
CO2: 31 mmol/L (ref 22–32)
Calcium: 9.4 mg/dL (ref 8.9–10.3)
Chloride: 103 mmol/L (ref 101–111)
Creatinine, Ser: 0.82 mg/dL (ref 0.61–1.24)
GFR calc Af Amer: >60 mL/min (ref >60)
GFR calc non Af Amer: >60 mL/min (ref >60)
Glucose, Bld: 125 mg/dL — ABNORMAL HIGH (ref 65–99)
Potassium: 3.7 mmol/L (ref 3.5–5.1)
Sodium: 142 mmol/L (ref 135–145)

## 2016-05-23 MED ORDER — CEPHALEXIN 500 MG PO CAPS: 500.0000 mg | ORAL_CAPSULE | Freq: Two times a day (BID) | ORAL | 0 refills | Status: DC

## 2016-05-23 NOTE — ED Notes (Signed)
ED Provider at bedside. 

## 2016-05-23 NOTE — ED Notes (Signed)
Patient transported to CT 

## 2016-05-23 NOTE — Discharge Instructions (Signed)
Please finish antibiotics for a potential UTI that can cause your bleeding. Your CT scan also shows enlarged prostate which can also be causing your bleeding.  Please call your urologist for follow-up. If you cannot find urology information at home, follow-up is provided on this paperwork  Return for worsening symptoms, including severe abdominal or flank pain, fevers, nausea or vomiting, or any other symptoms concerning to you.

## 2016-05-23 NOTE — ED Triage Notes (Signed)
Pt reports he began to have hematuria since last night. No pain or fever.

## 2016-05-23 NOTE — ED Provider Notes (Signed)
[facility] DEPT Provider Note   CSN: [id number] Arrival date & time: 05/23/16  0909     History   Chief Complaint Chief Complaint  Patient presents with  . Hematuria    HPI Matthew Hickman is a 83 y.o. male.  The history is provided by the patient.  Hematuria  This is a new problem. The current episode started 12 to 24 hours ago. The problem occurs constantly. The problem has not changed since onset.Pertinent negatives include no chest pain, no abdominal pain and no shortness of breath. Nothing aggravates the symptoms. Nothing relieves the symptoms. He has tried nothing for the symptoms.   83 year old male with history of hypertension who presents with hematuria that started yesterday evening. States that it splinted tension in his urine. Has not had history of dysuria, urinary frequency, urinary retention, or urgency. No prior history of kidney stones and denies any abdominal pain, flank pain, nausea or vomiting, fevers or chills. No associated chest pain or difficulty breathing. Has not happened to him before in the past.   Past Medical History:  Diagnosis Date  . Hypertension     There are no active problems to display for this patient.   Past Surgical History:  Procedure Laterality Date  . APPENDECTOMY         Home Medications    Prior to Admission medications   Medication Sig Start Date End Date Taking? Authorizing Provider  hydrochlorothiazide 25 MG tablet Take 25 mg by mouth daily.     Yes Historical Provider, MD  metoprolol tartrate (LOPRESSOR) 25 MG tablet Take 25 mg by mouth 2 (two) times daily.  03/20/16  Yes Historical Provider, MD  terazosin (HYTRIN) 1 MG capsule Take 1 mg by mouth at bedtime.   Yes Historical Provider, MD    Family History History reviewed. No pertinent family history.  Social History Social History  Substance Use Topics  . Smoking status: Never Smoker  . Smokeless tobacco: Never Used  . Alcohol use Not on file      Allergies   Patient has no known allergies.   Review of Systems Review of Systems  Constitutional: Negative for fever.  HENT: Negative for congestion.   Respiratory: Negative for shortness of breath.   Cardiovascular: Negative for chest pain.  Gastrointestinal: Negative for abdominal pain.  Genitourinary: Positive for hematuria. Negative for flank pain.  Musculoskeletal: Negative for back pain.  Allergic/Immunologic: Negative for immunocompromised state.  Hematological: Does not bruise/bleed easily.  Psychiatric/Behavioral: Negative for confusion.  All other systems reviewed and are negative.    Physical Exam Updated Vital Signs BP (!) 146/62   Pulse 83   Temp 98 F (36.7 C) (Oral)   Resp 16   Wt 140 lb (63.5 kg)   SpO2 98%   BMI 19.53 kg/m   Physical Exam Physical Exam  Nursing note and vitals reviewed. Constitutional: Well developed, elderly man, non-toxic, and in no acute distress Head: Normocephalic and atraumatic.  Mouth/Throat: Oropharynx is clear and moist.  Neck: Normal range of motion. Neck supple.  Cardiovascular: Normal rate and regular rhythm.   Pulmonary/Chest: Effort normal and breath sounds normal.  Abdominal: Soft. There is no tenderness. There is no rebound and no guarding. No cva tenderness  Musculoskeletal: Normal range of motion.  Neurological: Alert, no facial droop, fluent speech, moves all extremities symmetrically Skin: Skin is warm and dry.  Psychiatric: Cooperative   ED Treatments / Results  Labs (all labs ordered are listed, but only abnormal results are  displayed) Labs Reviewed  URINALYSIS, ROUTINE W REFLEX MICROSCOPIC - Abnormal; Notable for the following:       Result Value   APPearance HAZY (*)    Hgb urine dipstick LARGE (*)    Leukocytes, UA SMALL (*)    Bacteria, UA RARE (*)    Squamous Epithelial / LPF 0-5 (*)    All other components within normal limits  CBC WITH DIFFERENTIAL/PLATELET - Abnormal; Notable for the  following:    Hemoglobin 18.3 (*)    MCH 34.8 (*)    Platelets 106 (*)    All other components within normal limits  BASIC METABOLIC PANEL - Abnormal; Notable for the following:    Glucose, Bld 125 (*)    All other components within normal limits  URINE CULTURE    EKG  EKG Interpretation None       Radiology Ct Renal Stone Study  Result Date: 05/23/2016 CLINICAL DATA:  83 year old male with hematuria EXAM: CT ABDOMEN AND PELVIS WITHOUT CONTRAST TECHNIQUE: Multidetector CT imaging of the abdomen and pelvis was performed following the standard protocol without IV contrast. COMPARISON:  Prior CT scan of the abdomen and pelvis 09/17/2010 FINDINGS: Lower chest: The lung bases are clear. Visualized cardiac structures are within normal limits for size. No pericardial effusion. Unremarkable visualized distal thoracic esophagus. Hepatobiliary: Normal hepatic contour and morphology. Small hypoechoic lesions in the posterior aspect of hepatic segment 7 and in segment 3 are too small for accurate characterization but remain unchanged dating back to 2012 and are therefore benign. Gallbladder is unremarkable. No intra or extrahepatic biliary ductal dilatation. Pancreas: Unremarkable. No pancreatic ductal dilatation or surrounding inflammatory changes. Spleen: Normal in size without focal abnormality. Adrenals/Urinary Tract: The bilateral adrenal glands are normal. No evidence of hydronephrosis, nephrolithiasis or solid renal mass. A hyperdense lesion exophytic from the lower pole of the left kidney measures 9 mm and is unchanged compared to the 2012 consistent with a benign proteinaceous or hemorrhagic cyst. A few small parapelvic cysts are also noted in the inferior aspect of the left renal hilum and remain unchanged. The bladder is unremarkable save for indentation along the trigone from the markedly hypertrophic prostate gland. Stomach/Bowel: No evidence of obstruction or focal bowel wall thickening. The  appendix is surgically absent. The terminal ileum is unremarkable. Vascular/Lymphatic: Aortic atherosclerosis. No enlarged abdominal or pelvic lymph nodes. Reproductive: Marked prostatomegaly. The prostate gland measures 6.0 by 4.9 cm which is increased compared to 2012 when it measured 5.3 x 4.2 cm. Other: No abdominal wall hernia or abnormality. No abdominopelvic ascites. Musculoskeletal: No acute fracture or aggressive appearing lytic or blastic osseous lesion. Multilevel degenerative disc disease. IMPRESSION: 1. No evidence of nephrolithiasis or other acute abnormality. 2. Progressive marked prostatomegaly compared to August of 2012. This may represent the source of the patient's hematuria. 3. Additional ancillary findings as above without significant interval change compared to 2012. Electronically Signed   By: Jacqulynn Cadet M.D.   On: 05/23/2016 11:17    Procedures Procedures (including critical care time)  Medications Ordered in ED Medications - No data to display   Initial Impression / Assessment and Plan / ED Course  I have reviewed the triage vital signs and the nursing notes.  Pertinent labs & imaging results that were available during my care of the patient were reviewed by me and considered in my medical decision making (see chart for details).     Presenting with hematuria since this morning. Is nontoxic in no acute distress with  normal vital signs. Has no CVA tenderness or flank tenderness, and abdomen is soft and benign. UA with blood, small leukocytes and rare bacteria. Not an obvious UTI.   Will obtain CT abd/pelvis to rule out kidney stone, aortic aneurysm, or other acute processes that may cause hematuria.   CT visualized, and shows no aortic aneurysm, nephrolithiasis, renal tumor, or other major intra-abdominal processes. He does have enlarged prostate, which could explain his symptoms per radiology. We'll treat with course of Keflex for potential UTI. He also has  urologist that he will follow-up with.  Strict return and follow-up instructions reviewed. He expressed understanding of all discharge instructions and felt comfortable with the plan of care.   Final Clinical Impressions(s) / ED Diagnoses   Final diagnoses:  Gross hematuria    New Prescriptions New Prescriptions   No medications on file     Forde Dandy, MD 05/23/16 1200

## 2016-05-25 DIAGNOSIS — R972 Elevated prostate specific antigen [PSA]: Secondary | ICD-10-CM | POA: Diagnosis not present

## 2016-05-25 DIAGNOSIS — R310 Gross hematuria: Secondary | ICD-10-CM | POA: Diagnosis not present

## 2016-05-25 LAB — URINE CULTURE: Culture: <10000 — AB

## 2016-09-17 DIAGNOSIS — I10 Essential (primary) hypertension: Secondary | ICD-10-CM | POA: Diagnosis not present

## 2016-09-17 DIAGNOSIS — D45 Polycythemia vera: Secondary | ICD-10-CM | POA: Diagnosis not present

## 2016-09-17 DIAGNOSIS — N390 Urinary tract infection, site not specified: Secondary | ICD-10-CM | POA: Diagnosis not present

## 2016-09-17 DIAGNOSIS — Z125 Encounter for screening for malignant neoplasm of prostate: Secondary | ICD-10-CM | POA: Diagnosis not present

## 2016-09-22 DIAGNOSIS — R972 Elevated prostate specific antigen [PSA]: Secondary | ICD-10-CM | POA: Diagnosis not present

## 2016-09-22 DIAGNOSIS — Z0000 Encounter for general adult medical examination without abnormal findings: Secondary | ICD-10-CM | POA: Diagnosis not present

## 2016-09-22 DIAGNOSIS — I10 Essential (primary) hypertension: Secondary | ICD-10-CM | POA: Diagnosis not present

## 2016-10-06 DIAGNOSIS — D1801 Hemangioma of skin and subcutaneous tissue: Secondary | ICD-10-CM | POA: Diagnosis not present

## 2016-10-06 DIAGNOSIS — L814 Other melanin hyperpigmentation: Secondary | ICD-10-CM | POA: Diagnosis not present

## 2016-10-06 DIAGNOSIS — C4442 Squamous cell carcinoma of skin of scalp and neck: Secondary | ICD-10-CM | POA: Diagnosis not present

## 2016-10-06 DIAGNOSIS — C44629 Squamous cell carcinoma of skin of left upper limb, including shoulder: Secondary | ICD-10-CM | POA: Diagnosis not present

## 2016-10-06 DIAGNOSIS — D225 Melanocytic nevi of trunk: Secondary | ICD-10-CM | POA: Diagnosis not present

## 2016-10-06 DIAGNOSIS — D485 Neoplasm of uncertain behavior of skin: Secondary | ICD-10-CM | POA: Diagnosis not present

## 2016-10-06 DIAGNOSIS — L570 Actinic keratosis: Secondary | ICD-10-CM | POA: Diagnosis not present

## 2016-10-06 DIAGNOSIS — L821 Other seborrheic keratosis: Secondary | ICD-10-CM | POA: Diagnosis not present

## 2016-10-06 DIAGNOSIS — L989 Disorder of the skin and subcutaneous tissue, unspecified: Secondary | ICD-10-CM | POA: Diagnosis not present

## 2016-10-20 DIAGNOSIS — C44329 Squamous cell carcinoma of skin of other parts of face: Secondary | ICD-10-CM | POA: Diagnosis not present

## 2016-10-20 DIAGNOSIS — D0359 Melanoma in situ of other part of trunk: Secondary | ICD-10-CM | POA: Diagnosis not present

## 2016-10-20 DIAGNOSIS — L989 Disorder of the skin and subcutaneous tissue, unspecified: Secondary | ICD-10-CM | POA: Diagnosis not present

## 2016-10-30 DIAGNOSIS — Z23 Encounter for immunization: Secondary | ICD-10-CM | POA: Diagnosis not present

## 2017-09-20 DIAGNOSIS — I10 Essential (primary) hypertension: Secondary | ICD-10-CM | POA: Diagnosis not present

## 2017-09-20 DIAGNOSIS — R972 Elevated prostate specific antigen [PSA]: Secondary | ICD-10-CM | POA: Diagnosis not present

## 2017-09-20 DIAGNOSIS — N390 Urinary tract infection, site not specified: Secondary | ICD-10-CM | POA: Diagnosis not present

## 2017-09-27 DIAGNOSIS — I10 Essential (primary) hypertension: Secondary | ICD-10-CM | POA: Diagnosis not present

## 2017-09-27 DIAGNOSIS — Z0000 Encounter for general adult medical examination without abnormal findings: Secondary | ICD-10-CM | POA: Diagnosis not present

## 2017-09-27 DIAGNOSIS — R972 Elevated prostate specific antigen [PSA]: Secondary | ICD-10-CM | POA: Diagnosis not present

## 2017-11-23 DIAGNOSIS — D485 Neoplasm of uncertain behavior of skin: Secondary | ICD-10-CM | POA: Diagnosis not present

## 2017-11-23 DIAGNOSIS — C44329 Squamous cell carcinoma of skin of other parts of face: Secondary | ICD-10-CM | POA: Diagnosis not present

## 2017-11-23 DIAGNOSIS — L821 Other seborrheic keratosis: Secondary | ICD-10-CM | POA: Diagnosis not present

## 2017-11-23 DIAGNOSIS — L570 Actinic keratosis: Secondary | ICD-10-CM | POA: Diagnosis not present

## 2017-12-23 DIAGNOSIS — C44329 Squamous cell carcinoma of skin of other parts of face: Secondary | ICD-10-CM | POA: Diagnosis not present

## 2018-12-12 DIAGNOSIS — Z23 Encounter for immunization: Secondary | ICD-10-CM | POA: Diagnosis not present

## 2018-12-21 DIAGNOSIS — L989 Disorder of the skin and subcutaneous tissue, unspecified: Secondary | ICD-10-CM | POA: Diagnosis not present

## 2018-12-21 DIAGNOSIS — D485 Neoplasm of uncertain behavior of skin: Secondary | ICD-10-CM | POA: Diagnosis not present

## 2018-12-21 DIAGNOSIS — D225 Melanocytic nevi of trunk: Secondary | ICD-10-CM | POA: Diagnosis not present

## 2018-12-21 DIAGNOSIS — L821 Other seborrheic keratosis: Secondary | ICD-10-CM | POA: Diagnosis not present

## 2018-12-21 DIAGNOSIS — Z85828 Personal history of other malignant neoplasm of skin: Secondary | ICD-10-CM | POA: Diagnosis not present

## 2018-12-21 DIAGNOSIS — L814 Other melanin hyperpigmentation: Secondary | ICD-10-CM | POA: Diagnosis not present

## 2019-01-04 ENCOUNTER — Other Ambulatory Visit: Payer: Self-pay | Admitting: General Surgery

## 2019-01-04 DIAGNOSIS — D034 Melanoma in situ of scalp and neck: Secondary | ICD-10-CM | POA: Diagnosis not present

## 2019-01-04 DIAGNOSIS — D0359 Melanoma in situ of other part of trunk: Secondary | ICD-10-CM | POA: Diagnosis not present

## 2019-01-23 NOTE — Progress Notes (Signed)
[facility] - [place] - [address] [place] [address] Phone: [phone] Fax: [place], [place] [place] [address] Phone: [phone] Fax: [phone]      Your procedure is scheduled on Monday, Dec. 28th.  Report to [facility] Main Entrance "A" at 8:30 A.M., and check in at the Admitting office.  Call this number if you have problems the morning of surgery:  [phone]  Call [phone] if you have any questions prior to your surgery date Monday-Friday 8am-4pm    Remember:  Do not eat after midnight the night before your surgery  You may drink clear liquids until 7:30 AM the morning of your surgery.   Clear liquids allowed are: Water, Non-Citrus Juices (without pulp), Carbonated Beverages, Clear Tea, Black Coffee Only, and Gatorade   Enhanced Recovery after Surgery  Enhanced Recovery after Surgery is a protocol used to improve the stress on your body and your recovery after surgery.  Patient Instructions  . The night before surgery:  o No food after midnight. ONLY clear liquids after midnight  .  Marland Kitchen The day of surgery (if you do NOT have diabetes):  o Drink ONE (1) Pre-Surgery Clear Ensure as directed.   o This drink was given to you during your hospital  pre-op appointment visit. o The pre-op nurse will instruct you on the time to drink the  Pre-Surgery Ensure depending on your surgery time. o Finish the drink at the designated time by the pre-op nurse.  o Nothing else to drink after completing the  Pre-Surgery Clear Ensure.          If you have questions, please contact your surgeon's office.     Take these medicines the morning of surgery with A SIP OF WATER   Metoprolol   7 days prior to surgery STOP taking any Aspirin (unless otherwise instructed by your surgeon), Aleve, Naproxen,  Ibuprofen, Motrin, Advil, Goody's, BC's, all herbal medications, fish oil, and all vitamins.    The Morning of Surgery  Do not wear jewelry.  Do not wear lotions, powders, colognes, or deodorant  Men may shave face and neck.  Do not bring valuables to the hospital.  [facility] is not responsible for any belongings or valuables.  If you are a smoker, DO NOT Smoke 24 hours prior to surgery  If you wear a CPAP at night please bring your mask, tubing, and machine the morning of surgery   Remember that you must have someone to transport you home after your surgery, and remain with you for 24 hours if you are discharged the same day.   Please bring cases for contacts, glasses, hearing aids, dentures or bridgework because it cannot be worn into surgery.    Leave your suitcase in the car.  After surgery it may be brought to your room.  For patients admitted to the hospital, discharge time will be determined by your treatment team.  Patients discharged the day of surgery will not be allowed to drive home.    Special instructions:   [facility]- Preparing For Surgery  Before surgery, you can play an important role. Because skin is not sterile, your skin needs to be as free of germs as possible. You can reduce the number of germs on your skin by washing with CHG (chlorahexidine gluconate) Soap before surgery.  CHG is an antiseptic cleaner which kills germs and bonds with the skin to continue killing germs even after washing.    Oral Hygiene is also important to reduce your risk of infection.  Remember - BRUSH YOUR TEETH THE MORNING OF SURGERY WITH YOUR REGULAR TOOTHPASTE  Please do not use if you have an allergy to CHG or antibacterial soaps. If your skin becomes reddened/irritated stop using the CHG.  Do not shave (including legs and underarms) for at least 48 hours prior to first CHG shower. It is OK to shave your face.  Please follow these instructions carefully.   1. Shower the NIGHT  BEFORE SURGERY and the MORNING OF SURGERY with CHG Soap.   2. If you chose to wash your hair, wash your hair first as usual with your normal shampoo.  3. After you shampoo, rinse your hair and body thoroughly to remove the shampoo.  4. Use CHG as you would any other liquid soap. You can apply CHG directly to the skin and wash gently with a scrungie or a clean washcloth.   5. Apply the CHG Soap to your body ONLY FROM THE NECK DOWN.  Do not use on open wounds or open sores. Avoid contact with your eyes, ears, mouth and genitals (private parts). Wash Face and genitals (private parts)  with your normal soap.   6. Wash thoroughly, paying special attention to the area where your surgery will be performed.  7. Thoroughly rinse your body with warm water from the neck down.  8. DO NOT shower/wash with your normal soap after using and rinsing off the CHG Soap.  9. Pat yourself dry with a CLEAN TOWEL.  10. Wear CLEAN PAJAMAS to bed the night before surgery, wear comfortable clothes the morning of surgery  11. Place CLEAN SHEETS on your bed the night of your first shower and DO NOT SLEEP WITH PETS.    Day of Surgery:  Please shower the morning of surgery with the CHG soap Do not apply any deodorants/lotions. Please wear clean clothes to the hospital/surgery center.   Remember to brush your teeth WITH YOUR REGULAR TOOTHPASTE.   Please read over the following fact sheets that you were given.

## 2019-01-24 ENCOUNTER — Other Ambulatory Visit: Payer: Self-pay

## 2019-01-24 ENCOUNTER — Encounter (HOSPITAL_COMMUNITY): Payer: Self-pay

## 2019-01-24 ENCOUNTER — Encounter (HOSPITAL_COMMUNITY)
Admission: RE | Admit: 2019-01-24 | Discharge: 2019-01-24 | Disposition: A | Discharge status: Home / Self Care | Payer: Medicare Other | Source: Ambulatory Visit | Attending: General Surgery | Admitting: General Surgery

## 2019-01-24 DIAGNOSIS — C434 Malignant melanoma of scalp and neck: Secondary | ICD-10-CM | POA: Diagnosis not present

## 2019-01-24 DIAGNOSIS — C4359 Malignant melanoma of other part of trunk: Secondary | ICD-10-CM | POA: Diagnosis not present

## 2019-01-24 DIAGNOSIS — Z01818 Encounter for other preprocedural examination: Secondary | ICD-10-CM | POA: Insufficient documentation

## 2019-01-24 DIAGNOSIS — I10 Essential (primary) hypertension: Secondary | ICD-10-CM | POA: Diagnosis not present

## 2019-01-24 DIAGNOSIS — I498 Other specified cardiac arrhythmias: Secondary | ICD-10-CM | POA: Insufficient documentation

## 2019-01-24 HISTORY — DX: Malignant (primary) neoplasm, unspecified: C80.1

## 2019-01-24 LAB — CBC
HCT: 45 % (ref 39.0–52.0)
Hemoglobin: 15.3 g/dL (ref 13.0–17.0)
MCH: 32.4 pg (ref 26.0–34.0)
MCHC: 34 g/dL (ref 30.0–36.0)
MCV: 95.3 fL (ref 80.0–100.0)
Platelets: 118 10*3/uL — ABNORMAL LOW (ref 150–400)
RBC: 4.72 MIL/uL (ref 4.22–5.81)
RDW: 14 % (ref 11.5–15.5)
WBC: 5.4 10*3/uL (ref 4.0–10.5)
nRBC: 0 % (ref 0.0–0.2)

## 2019-01-24 LAB — BASIC METABOLIC PANEL
Anion gap: 9 (ref 5–15)
BUN: 28 mg/dL — ABNORMAL HIGH (ref 8–23)
CO2: 29 mmol/L (ref 22–32)
Calcium: 9.4 mg/dL (ref 8.9–10.3)
Chloride: 102 mmol/L (ref 98–111)
Creatinine, Ser: 0.77 mg/dL (ref 0.61–1.24)
GFR calc Af Amer: >60 mL/min (ref >60)
GFR calc non Af Amer: >60 mL/min (ref >60)
Glucose, Bld: 131 mg/dL — ABNORMAL HIGH (ref 70–99)
Potassium: 3.8 mmol/L (ref 3.5–5.1)
Sodium: 140 mmol/L (ref 135–145)

## 2019-01-24 NOTE — Progress Notes (Signed)
PCP - Dr. [name] Cardiologist - Denies  Chest x-ray - N/A EKG - 01/24/2019 Stress Test - Denies ECHO - Denies Cardiac Cath - Denies   ERAS Protcol - Yes PRE-SURGERY Ensure or G2-  Ensure given  COVID TEST- Scheduled 01/28/2019   Anesthesia review: No  Patient denies shortness of breath, fever, cough and chest pain at PAT appointment   All instructions explained to the patient, with a verbal understanding of the material. Patient agrees to go over the instructions while at home for a better understanding. Patient also instructed to self quarantine after being tested for COVID-19. The opportunity to ask questions was provided.

## 2019-01-26 MED ORDER — CEFAZOLIN SODIUM-DEXTROSE 2-4 GM/100ML-% IV SOLN
2.0000 g | INTRAVENOUS | Status: AC
  Administered 2019-01-30: 2 g via INTRAVENOUS
  Filled 2019-01-26: qty 100

## 2019-01-28 ENCOUNTER — Other Ambulatory Visit (HOSPITAL_COMMUNITY)
Admission: RE | Admit: 2019-01-28 | Discharge: 2019-01-28 | Disposition: A | Discharge status: Home / Self Care | Payer: Medicare Other | Source: Ambulatory Visit | Attending: General Surgery | Admitting: General Surgery

## 2019-01-28 DIAGNOSIS — Z20828 Contact with and (suspected) exposure to other viral communicable diseases: Secondary | ICD-10-CM | POA: Insufficient documentation

## 2019-01-28 DIAGNOSIS — Z01812 Encounter for preprocedural laboratory examination: Secondary | ICD-10-CM | POA: Diagnosis present

## 2019-01-28 LAB — SARS CORONAVIRUS 2 (TAT 6-24 HRS): SARS Coronavirus 2: NEGATIVE

## 2019-01-30 ENCOUNTER — Other Ambulatory Visit: Payer: Self-pay

## 2019-01-30 ENCOUNTER — Encounter (HOSPITAL_COMMUNITY)
Admission: RE | Disposition: A | Discharge status: Home / Self Care | Payer: Self-pay | Source: Home / Self Care | Attending: General Surgery

## 2019-01-30 ENCOUNTER — Ambulatory Visit (HOSPITAL_COMMUNITY): Payer: Medicare Other | Admitting: Physician Assistant

## 2019-01-30 ENCOUNTER — Encounter (HOSPITAL_COMMUNITY): Payer: Self-pay | Admitting: General Surgery

## 2019-01-30 ENCOUNTER — Ambulatory Visit (HOSPITAL_COMMUNITY)
Admission: RE | Admit: 2019-01-30 | Discharge: 2019-01-30 | Disposition: A | Discharge status: Home / Self Care | Payer: Medicare Other | Attending: General Surgery | Admitting: General Surgery

## 2019-01-30 DIAGNOSIS — Z9842 Cataract extraction status, left eye: Secondary | ICD-10-CM | POA: Insufficient documentation

## 2019-01-30 DIAGNOSIS — Z9841 Cataract extraction status, right eye: Secondary | ICD-10-CM | POA: Diagnosis not present

## 2019-01-30 DIAGNOSIS — R030 Elevated blood-pressure reading, without diagnosis of hypertension: Secondary | ICD-10-CM | POA: Insufficient documentation

## 2019-01-30 DIAGNOSIS — Z8249 Family history of ischemic heart disease and other diseases of the circulatory system: Secondary | ICD-10-CM | POA: Insufficient documentation

## 2019-01-30 DIAGNOSIS — C4359 Malignant melanoma of other part of trunk: Secondary | ICD-10-CM | POA: Insufficient documentation

## 2019-01-30 DIAGNOSIS — C434 Malignant melanoma of scalp and neck: Secondary | ICD-10-CM | POA: Diagnosis present

## 2019-01-30 DIAGNOSIS — Z79899 Other long term (current) drug therapy: Secondary | ICD-10-CM | POA: Insufficient documentation

## 2019-01-30 DIAGNOSIS — D034 Melanoma in situ of scalp and neck: Secondary | ICD-10-CM | POA: Insufficient documentation

## 2019-01-30 DIAGNOSIS — D0359 Melanoma in situ of other part of trunk: Secondary | ICD-10-CM | POA: Diagnosis not present

## 2019-01-30 HISTORY — PX: MELANOMA EXCISION: SHX5266

## 2019-01-30 SURGERY — EXCISION, MELANOMA
Anesthesia: General | Laterality: Bilateral

## 2019-01-30 MED ORDER — PHENYLEPHRINE 40 MCG/ML (10ML) SYRINGE FOR IV PUSH (FOR BLOOD PRESSURE SUPPORT)
PREFILLED_SYRINGE | INTRAVENOUS | Status: DC | PRN
  Administered 2019-01-30 (×4): 80 ug via INTRAVENOUS
  Administered 2019-01-30: 40 ug via INTRAVENOUS
  Administered 2019-01-30: 80 ug via INTRAVENOUS

## 2019-01-30 MED ORDER — CHLORHEXIDINE GLUCONATE CLOTH 2 % EX PADS: 6.0000 | MEDICATED_PAD | Freq: Once | CUTANEOUS | Status: DC

## 2019-01-30 MED ORDER — ONDANSETRON HCL 4 MG/2ML IJ SOLN
INTRAMUSCULAR | Status: DC | PRN
  Administered 2019-01-30: 4 mg via INTRAVENOUS

## 2019-01-30 MED ORDER — ACETAMINOPHEN 500 MG PO TABS
500.0000 mg | ORAL_TABLET | ORAL | Status: AC
  Administered 2019-01-30: 500 mg via ORAL
  Filled 2019-01-30: qty 1

## 2019-01-30 MED ORDER — 0.9 % SODIUM CHLORIDE (POUR BTL) OPTIME
TOPICAL | Status: DC | PRN
  Administered 2019-01-30: 12:00:00 1000 mL

## 2019-01-30 MED ORDER — LIDOCAINE-EPINEPHRINE 1 %-1:100000 IJ SOLN
INTRAMUSCULAR | Status: DC | PRN
  Administered 2019-01-30: 20 mL

## 2019-01-30 MED ORDER — PHENYLEPHRINE HCL-NACL 10-0.9 MG/250ML-% IV SOLN: INTRAVENOUS | Status: DC | PRN

## 2019-01-30 MED ORDER — PROPOFOL 10 MG/ML IV BOLUS
INTRAVENOUS | Status: AC
  Filled 2019-01-30: qty 20

## 2019-01-30 MED ORDER — BACITRACIN ZINC 500 UNIT/GM EX OINT
TOPICAL_OINTMENT | CUTANEOUS | Status: AC
  Filled 2019-01-30: qty 28.35

## 2019-01-30 MED ORDER — ONDANSETRON HCL 4 MG/2ML IJ SOLN: 4.0000 mg | Freq: Once | INTRAMUSCULAR | Status: DC | PRN

## 2019-01-30 MED ORDER — LIDOCAINE-EPINEPHRINE 1 %-1:100000 IJ SOLN
INTRAMUSCULAR | Status: AC
  Filled 2019-01-30: qty 1

## 2019-01-30 MED ORDER — LACTATED RINGERS IV SOLN: INTRAVENOUS | Status: DC | PRN

## 2019-01-30 MED ORDER — LIDOCAINE 2% (20 MG/ML) 5 ML SYRINGE
INTRAMUSCULAR | Status: DC | PRN
  Administered 2019-01-30: 40 mg via INTRAVENOUS

## 2019-01-30 MED ORDER — ENSURE PRE-SURGERY PO LIQD: 296.0000 mL | Freq: Once | ORAL | Status: DC

## 2019-01-30 MED ORDER — FENTANYL CITRATE (PF) 250 MCG/5ML IJ SOLN
INTRAMUSCULAR | Status: DC | PRN
  Administered 2019-01-30 (×3): 50 ug via INTRAVENOUS

## 2019-01-30 MED ORDER — FENTANYL CITRATE (PF) 250 MCG/5ML IJ SOLN
INTRAMUSCULAR | Status: AC
  Filled 2019-01-30: qty 5

## 2019-01-30 MED ORDER — PROPOFOL 10 MG/ML IV BOLUS
INTRAVENOUS | Status: DC | PRN
  Administered 2019-01-30: 100 mg via INTRAVENOUS

## 2019-01-30 MED ORDER — ROCURONIUM BROMIDE 10 MG/ML (PF) SYRINGE
PREFILLED_SYRINGE | INTRAVENOUS | Status: DC | PRN
  Administered 2019-01-30: 50 mg via INTRAVENOUS
  Administered 2019-01-30: 10 mg via INTRAVENOUS

## 2019-01-30 MED ORDER — TRAMADOL HCL 50 MG PO TABS: 50.0000 mg | ORAL_TABLET | Freq: Four times a day (QID) | ORAL | 0 refills | Status: DC | PRN

## 2019-01-30 MED ORDER — SUGAMMADEX SODIUM 200 MG/2ML IV SOLN
INTRAVENOUS | Status: DC | PRN
  Administered 2019-01-30: 200 mg via INTRAVENOUS

## 2019-01-30 MED ORDER — FENTANYL CITRATE (PF) 100 MCG/2ML IJ SOLN: 25.0000 ug | INTRAMUSCULAR | Status: DC | PRN

## 2019-01-30 MED ORDER — EPHEDRINE SULFATE-NACL 50-0.9 MG/10ML-% IV SOSY
PREFILLED_SYRINGE | INTRAVENOUS | Status: DC | PRN
  Administered 2019-01-30 (×2): 10 mg via INTRAVENOUS

## 2019-01-30 MED ORDER — BUPIVACAINE HCL (PF) 0.25 % IJ SOLN
INTRAMUSCULAR | Status: AC
  Filled 2019-01-30: qty 30

## 2019-01-30 MED ORDER — PHENYLEPHRINE HCL-NACL 10-0.9 MG/250ML-% IV SOLN
INTRAVENOUS | Status: DC | PRN
  Administered 2019-01-30: 30 ug/min via INTRAVENOUS

## 2019-01-30 MED ORDER — BUPIVACAINE HCL (PF) 0.25 % IJ SOLN
INTRAMUSCULAR | Status: DC | PRN
  Administered 2019-01-30: 20 mL

## 2019-01-30 MED ORDER — ACETAMINOPHEN 325 MG PO TABS: 650.0000 mg | ORAL_TABLET | Freq: Four times a day (QID) | ORAL | Status: DC | PRN

## 2019-01-30 SURGICAL SUPPLY — 47 items
BENZOIN TINCTURE PRP APPL 2/3 (GAUZE/BANDAGES/DRESSINGS) ×6 IMPLANT
BNDG COHESIVE 3X5 TAN STRL LF (GAUZE/BANDAGES/DRESSINGS) ×3 IMPLANT
CANISTER SUCT 3000ML PPV (MISCELLANEOUS) ×3 IMPLANT
CHLORAPREP W/TINT 26 (MISCELLANEOUS) ×3 IMPLANT
CLOSURE STERI-STRIP 1/2X4 (GAUZE/BANDAGES/DRESSINGS) ×2
CLOSURE WOUND 1/2 X4 (GAUZE/BANDAGES/DRESSINGS)
CLSR STERI-STRIP ANTIMIC 1/2X4 (GAUZE/BANDAGES/DRESSINGS) ×4 IMPLANT
CONT SPEC 4OZ CLIKSEAL STRL BL (MISCELLANEOUS) ×3 IMPLANT
COVER SURGICAL LIGHT HANDLE (MISCELLANEOUS) ×3 IMPLANT
COVER WAND RF STERILE (DRAPES) ×3 IMPLANT
DERMABOND ADVANCED (GAUZE/BANDAGES/DRESSINGS)
DERMABOND ADVANCED .7 DNX12 (GAUZE/BANDAGES/DRESSINGS) IMPLANT
DRAPE LAPAROTOMY 100X72 PEDS (DRAPES) IMPLANT
DRAPE UTILITY XL STRL (DRAPES) ×3 IMPLANT
DRSG TEGADERM 4X4.75 (GAUZE/BANDAGES/DRESSINGS) ×6 IMPLANT
ELECT COATED BLADE 2.86 ST (ELECTRODE) ×3 IMPLANT
ELECT REM PT RETURN 9FT ADLT (ELECTROSURGICAL) ×3
ELECTRODE REM PT RTRN 9FT ADLT (ELECTROSURGICAL) ×1 IMPLANT
GAUZE 4X4 16PLY RFD (DISPOSABLE) ×3 IMPLANT
GAUZE SPONGE 4X4 12PLY STRL (GAUZE/BANDAGES/DRESSINGS) ×9 IMPLANT
GLOVE BIO SURGEON STRL SZ 6 (GLOVE) ×3 IMPLANT
GLOVE INDICATOR 6.5 STRL GRN (GLOVE) ×3 IMPLANT
GOWN STRL REUS W/ TWL LRG LVL3 (GOWN DISPOSABLE) ×1 IMPLANT
GOWN STRL REUS W/TWL 2XL LVL3 (GOWN DISPOSABLE) ×3 IMPLANT
GOWN STRL REUS W/TWL LRG LVL3 (GOWN DISPOSABLE) ×2
KIT BASIN OR (CUSTOM PROCEDURE TRAY) ×3 IMPLANT
KIT TURNOVER KIT B (KITS) ×3 IMPLANT
MARKER SKIN DUAL TIP RULER LAB (MISCELLANEOUS) ×3 IMPLANT
NEEDLE 22X1 1/2 (OR ONLY) (NEEDLE) ×3 IMPLANT
NEEDLE HYPO 25GX1X1/2 BEV (NEEDLE) ×6 IMPLANT
NS IRRIG 1000ML POUR BTL (IV SOLUTION) ×3 IMPLANT
PACK GENERAL/GYN (CUSTOM PROCEDURE TRAY) ×3 IMPLANT
PAD ARMBOARD 7.5X6 YLW CONV (MISCELLANEOUS) ×6 IMPLANT
PENCIL SMOKE EVACUATOR (MISCELLANEOUS) ×3 IMPLANT
SPECIMEN JAR SMALL (MISCELLANEOUS) ×3 IMPLANT
STRIP CLOSURE SKIN 1/2X4 (GAUZE/BANDAGES/DRESSINGS) IMPLANT
SUT ETHILON 2 0 FS 18 (SUTURE) ×3 IMPLANT
SUT ETHILON 4 0 PS 2 18 (SUTURE) ×6 IMPLANT
SUT MNCRL AB 4-0 PS2 18 (SUTURE) ×6 IMPLANT
SUT SILK 2 0 PERMA HAND 18 BK (SUTURE) IMPLANT
SUT SILK 2 0 SH (SUTURE) ×6 IMPLANT
SUT VIC AB 2-0 SH 27 (SUTURE) ×2
SUT VIC AB 2-0 SH 27XBRD (SUTURE) ×1 IMPLANT
SUT VIC AB 3-0 SH 27 (SUTURE) ×2
SUT VIC AB 3-0 SH 27X BRD (SUTURE) ×1 IMPLANT
SYR CONTROL 10ML LL (SYRINGE) ×6 IMPLANT
TOWEL GREEN STERILE FF (TOWEL DISPOSABLE) ×3 IMPLANT

## 2019-01-30 NOTE — Op Note (Signed)
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  POST-OPERATIVE DIAGNOSIS:  Same  PROCEDURE:  Procedure(s): Wide local excision 5 mm margins right back melanoma with advancement flap closure for defect 5 cm x 3 cm, wide local excision 5 mm margins left neck melanoma with advancement flap closure for 5x2.5 cm margins  SURGEON:  Surgeon(s): [name], MD  ASSIST:  Pryor Curia, RNFA  ANESTHESIA:   local and general  DRAINS: none   LOCAL MEDICATIONS USED:  MARCAINE    and XYLOCAINE   SPECIMEN:  Source of Specimen:  wide local excision right back melanoma, wide local excision left neck melanoma, additional posterior margin left neck melanoma   FINDINGS:  No residual pigment.    DISPOSITION OF SPECIMEN:  PATHOLOGY  COUNTS:  YES  PLAN OF CARE: Discharge to home after PACU  PATIENT DISPOSITION:  PACU - hemodynamically stable.   PROCEDURE:   Pt was identified in the holding area, taken to the OR, and placed supine on the OR table.  General anesthesia was induced.  Time out was performed according to the surgical safety checklist.  When all was correct, we continued.    The patient was placed into the left lateral decubitus position.  The right back was prepped and draped in sterile fashion.  The melanoma was identified and 0.5 cm margins were marked out.  Local was administered under the melanoma and the adjacent tissue.  A #10 blade was used to incise the skin around the melanoma.  The cautery was used to take the dissection down to the fascia.  The skin was marked in situ with orientation sutures.  The cautery was used to take the specimen off the fascia, and it was passed off the table.    Skin hooks were used to elevate the edges of the incision and the skin was freed up in all directions with cautery to create advancement blaps.  This was pulled together in an oblique orientation. The skin was pulled together to check the tension. Several Deep  interrupted 2-0 vicryl sutures were placed to relieve tension.  The skin was then reapproximated with 4-0 monocryl running subcuticular sutures.  4-0 nylon vertical mattress sutures were placed as well.  This was then dressed with benzoin, steristrips, gauze, and tegaderm.  The patient was placed back supine.    The patient's left neck/ear was prepped and draped in sterile fashion. The melanoma was addressed similarly and closed with interrupted 3-0 deep dermals, 4-0 monocryl running subcuticular suture, and 4-0 nylon interrupted mattress sutures.  The incision  was cleaned, dried, and dressed with Benzoin, steristrips, gauze, and tegaderm.    Needle, sponge, and instrument counts were correct.  The patient was awakened from anesthesia and taken to the PACU in stable condition.    COMPLICATIONS:  Left arm IV infiltrated and then skin tore with degloving with arm manipulation.  Skin was very thin in this region and not amenable to closure.  This was dressed with bacitracin, gauze, and coban.

## 2019-01-30 NOTE — Transfer of Care (Signed)
Immediate Anesthesia Transfer of Care Note  Patient: Matthew Hickman  Procedure(s) Performed: WIDE LOCAL EXCISION WITH ADVANCEMENT FLAP CLOSURE OF MELANOMA LEFT NECK AND RIGHT BACK (Bilateral )  Patient Location: PACU  Anesthesia Type:General  Level of Consciousness: awake, alert  and patient cooperative  Airway & Oxygen Therapy: Patient Spontanous Breathing  Post-op Assessment: Report given to RN and Post -op Vital signs reviewed and stable  Post vital signs: Reviewed and stable  Last Vitals:  Vitals Value Taken Time  BP 100/79 01/30/19 1242  Temp    Pulse 81 01/30/19 1245  Resp 12 01/30/19 1245  SpO2 100 % 01/30/19 1245  Vitals shown include unvalidated device data.  Last Pain:  Vitals:   01/30/19 0852  TempSrc:   PainSc: 0-No pain         Complications: No apparent anesthesia complications

## 2019-01-30 NOTE — Anesthesia Postprocedure Evaluation (Signed)
Anesthesia Post Note  Patient: Matthew Hickman  Procedure(s) Performed: WIDE LOCAL EXCISION WITH ADVANCEMENT FLAP CLOSURE OF MELANOMA LEFT NECK AND RIGHT BACK (Bilateral )     Patient location during evaluation: PACU Anesthesia Type: General Level of consciousness: awake and alert, oriented and patient cooperative Pain management: pain level controlled Vital Signs Assessment: post-procedure vital signs reviewed and stable Respiratory status: spontaneous breathing, nonlabored ventilation and respiratory function stable Cardiovascular status: blood pressure returned to baseline and stable Postop Assessment: no apparent nausea or vomiting Anesthetic complications: no    Last Vitals:  Vitals:   01/30/19 1412 01/30/19 1415  BP: (!) 113/54   Pulse: 61 69  Resp: 17 (!) 25  Temp:  36.6 C  SpO2: 100% 99%    Last Pain:  Vitals:   01/30/19 1415  TempSrc:   PainSc: 0-No pain                 Pervis Hocking

## 2019-01-30 NOTE — Anesthesia Preprocedure Evaluation (Signed)
Anesthesia Evaluation  Patient identified by MRN, date of birth, ID band Patient awake    Reviewed: Allergy & Precautions, NPO status , Patient's Chart, lab work & pertinent test results, reviewed documented beta blocker date and time   Airway Mallampati: II  TM Distance: >3 FB Neck ROM: Full    Dental no notable dental hx.    Pulmonary neg pulmonary ROS,    Pulmonary exam normal breath sounds clear to auscultation       Cardiovascular hypertension, Pt. on medications and Pt. on home beta blockers negative cardio ROS Normal cardiovascular exam Rhythm:Regular Rate:Normal     Neuro/Psych negative neurological ROS  negative psych ROS   GI/Hepatic negative GI ROS, Neg liver ROS,   Endo/Other  negative endocrine ROS  Renal/GU negative Renal ROS  negative genitourinary   Musculoskeletal negative musculoskeletal ROS (+)   Abdominal Normal abdominal exam  (+)   Peds negative pediatric ROS (+)  Hematology negative hematology ROS (+)   Anesthesia Other Findings Melanoma left neck and back  Reproductive/Obstetrics negative OB ROS                             Anesthesia Physical Anesthesia Plan  ASA: III  Anesthesia Plan: General   Post-op Pain Management:    Induction: Intravenous  PONV Risk Score and Plan: 2 and Ondansetron, Dexamethasone and Treatment may vary due to age or medical condition  Airway Management Planned: Oral ETT  Additional Equipment: None  Intra-op Plan:   Post-operative Plan: Extubation in OR  Informed Consent: I have reviewed the patients History and Physical, chart, labs and discussed the procedure including the risks, benefits and alternatives for the proposed anesthesia with the patient or authorized representative who has indicated his/her understanding and acceptance.     Dental advisory given  Plan Discussed with: CRNA  Anesthesia Plan Comments:          Anesthesia Quick Evaluation

## 2019-01-30 NOTE — H&P (Signed)
Matthew Hickman Documented: 01/04/2019 4:29 PM Location: [facility] Patient #: [id number] DOB: [birth year] Married / Language: Matthew Hickman / Race: White Male   History of Present Illness Matthew Klein MD; 01/04/2019 5:44 PM) The patient is a 85 year old male who presents with malignant melanoma. 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 the patient went in for a lesion on his nose and had a skin survey. He was seen to have a pigmented area on his left neck and then on his right back that were both partially biopsied. Both lesions showed melanoma in situ. The patient denies pain in these areas. He hadn't noted the one on his back. He has had other skin cancers but isn't sure if any of them were melanoma. He describes one surgery that sounds like possibly a mohs operation in front of his right ear. He is unaware of any family history of cancer. His father had heart disease.    Past Surgical History Matthew Hickman, Matthew Hickman; 01/04/2019 4:29 PM) Appendectomy  Cataract Surgery  Bilateral. Vasectomy   Diagnostic Studies History Matthew Hickman, CMA; 01/04/2019 4:29 PM) Colonoscopy  5-10 years ago  Allergies Matthew Hickman, CMA; 01/04/2019 4:30 PM) No Known Drug Allergies [01/04/2019]: Allergies Reconciled   Medication History Matthew Hickman, CMA; 01/04/2019 4:30 PM) hydroCHLOROthiazide (25MG  Tablet, Oral) Active. Metoprolol Tartrate (25MG  Tablet, Oral) Active. Terazosin HCl (1MG  Capsule, Oral) Active. Medications Reconciled  Social History Matthew Hickman, CMA; 01/04/2019 4:29 PM) Alcohol use  Moderate alcohol use. Caffeine use  Carbonated beverages. No drug use  Tobacco use  Never smoker.  Family History Matthew Hickman, [name]; 01/04/2019 4:29 PM) Heart Disease  Father.  Other Problems Matthew Hickman, CMA; 01/04/2019 4:29 PM) High blood pressure     Review of Systems Matthew Hickman CMA; 01/04/2019 4:30  PM) General Present- Weight Loss. Not Present- Appetite Loss, Chills, Fatigue, Fever, Night Sweats and Weight Gain. Skin Not Present- Change in Wart/Mole, Dryness, Hives, Jaundice, New Lesions, Non-Healing Wounds, Rash and Ulcer. HEENT Present- Hearing Loss and Wears glasses/contact lenses. Not Present- Earache, Hoarseness, Nose Bleed, Oral Ulcers, Ringing in the Ears, Seasonal Allergies, Sinus Pain, Sore Throat, Visual Disturbances and Yellow Eyes. Cardiovascular Not Present- Chest Pain, Difficulty Breathing Lying Down, Leg Cramps, Palpitations, Rapid Heart Rate, Shortness of Breath and Swelling of Extremities. Gastrointestinal Not Present- Abdominal Pain, Bloating, Bloody Stool, Change in Bowel Habits, Chronic diarrhea, Constipation, Difficulty Swallowing, Excessive gas, Gets full quickly at meals, Hemorrhoids, Indigestion, Nausea, Rectal Pain and Vomiting. Male Genitourinary Present- Nocturia. Not Present- Blood in Urine, Change in Urinary Stream, Frequency, Impotence, Painful Urination, Urgency and Urine Leakage. Musculoskeletal Not Present- Back Pain, Joint Pain, Joint Stiffness, Muscle Pain, Muscle Weakness and Swelling of Extremities. Neurological Not Present- Decreased Memory, Fainting, Headaches, Numbness, Seizures, Tingling, Tremor, Trouble walking and Weakness. Psychiatric Not Present- Anxiety, Bipolar, Change in Sleep Pattern, Depression, Fearful and Frequent crying. Endocrine Not Present- Cold Intolerance, Excessive Hunger, Hair Changes, Heat Intolerance, Hot flashes and New Diabetes.  Vitals Matthew Hickman CMA; 01/04/2019 4:31 PM) 01/04/2019 4:30 PM Weight: 132 lb Height: 72in Body Surface Area: 1.79 m Body Mass Index: 17.9 kg/m  Temp.: 97.73F(Tympanic)  Pulse: 115 (Regular)  BP: 112/62 (Sitting, Left Arm, Standard)       Physical Exam Matthew Klein MD; 01/04/2019 5:46 PM) General Mental Status-Alert. General Appearance-Consistent with stated  age. Hydration-Well hydrated. Voice-Normal.  Integumentary Note: Pt appears to have thin skin especially on the hands.  He has numerous small seborrheic keratoses on his hands. He has a dark irregular pigmented lesion obliquely oriented on his left neck just below the anterior mastoid. this is around 1x2 cm. it is partially scabbed. The back is similar but more vertically oriented.   Head and Neck Head-normocephalic, atraumatic with no lesions or palpable masses. Trachea-midline. Thyroid Gland Characteristics - normal size and consistency.  Eye Eyeball - Bilateral-Extraocular movements intact. Sclera/Conjunctiva - Bilateral-No scleral icterus.  Chest and Lung Exam Chest and lung exam reveals -quiet, even and easy respiratory effort with no use of accessory muscles and on auscultation, normal breath sounds, no adventitious sounds and normal vocal resonance. Inspection Chest Wall - Normal. Back - normal.  Cardiovascular Cardiovascular examination reveals -normal heart sounds, regular rate and rhythm with no murmurs and normal pedal pulses bilaterally.  Abdomen Inspection Inspection of the abdomen reveals - No Hernias. Palpation/Percussion Palpation and Percussion of the abdomen reveal - Soft, Non Tender, No Rebound tenderness, No Rigidity (guarding) and No hepatosplenomegaly. Auscultation Auscultation of the abdomen reveals - Bowel sounds normal.  Neurologic Neurologic evaluation reveals -alert and oriented x 3 with no impairment of recent or remote memory. Mental Status-Normal.  Musculoskeletal Global Assessment -Note: no gross deformities.  Normal Exam - Left-Upper Extremity Strength Normal and Lower Extremity Strength Normal. Normal Exam - Right-Upper Extremity Strength Normal and Lower Extremity Strength Normal.  Lymphatic Head & Neck  General Head & Neck Lymphatics: Bilateral - Description - Normal. Axillary  General Axillary Region:  Bilateral - Description - Normal. Tenderness - Non Tender. Femoral & Inguinal  Generalized Femoral & Inguinal Lymphatics: Bilateral - Description - No Generalized lymphadenopathy.    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 They both need wide local excision wtih 5 mm margins. This would be an ellipse and then have advancement flap closures. Because of his age and thin skin, i would close these with monofilament suture such as nylons in order to minimize tearing of the skin. I reviewed risks of bleeding, infection, wound dehiscence, numbness and possible need for additional surgery if margins were positive.  He is not on any blood thinners and has no history of heart disease. He is on minimal medication. There was no evidence of invasion, so I would not do sentinel node. Current Plans Pt Education - [employer] Free Text Education/Instructions: discussed with patient and provided information. Pt Education - Melanoma: skin cancer MELANOMA IN SITU OF NECK (D03.4) Impression: see above.    Signed electronically by Matthew Klein, MD (01/04/2019 5:50 PM)

## 2019-01-30 NOTE — Interval H&P Note (Signed)
History and Physical Interval Note:  01/30/2019 10:18 AM  Matthew Hickman  has presented today for surgery, with the diagnosis of MELANOMA LEFT NECK AND BACK.  The various methods of treatment have been discussed with the patient and family. After consideration of risks, benefits and other options for treatment, the patient has consented to  Procedure(s): WIDE LOCAL EXCISION WITH ADVANCEMENT FLAP CLOSURE OF MELANOMA LEFT NECK AND RIGHT BACK (Bilateral) as a surgical intervention.  The patient's history has been reviewed, patient examined, no change in status, stable for surgery.  I have reviewed the patient's chart and labs.  Questions were answered to the patient's satisfaction.     [name]

## 2019-01-30 NOTE — Anesthesia Procedure Notes (Signed)
Procedure Name: Intubation Date/Time: 01/30/2019 10:44 AM Performed by: Janace Litten, CRNA Pre-anesthesia Checklist: Patient identified, Emergency Drugs available, Suction available and Patient being monitored Patient Re-evaluated:Patient Re-evaluated prior to induction Oxygen Delivery Method: Circle System Utilized Preoxygenation: Pre-oxygenation with 100% oxygen Induction Type: IV induction Ventilation: Mask ventilation without difficulty Laryngoscope Size: Mac and 4 Grade View: Grade I Tube type: Oral Tube size: 7.5 mm Number of attempts: 1 Airway Equipment and Method: Stylet Placement Confirmation: ETT inserted through vocal cords under direct vision,  positive ETCO2 and breath sounds checked- equal and bilateral Secured at: 24 cm Tube secured with: Tape Dental Injury: Teeth and Oropharynx as per pre-operative assessment

## 2019-02-01 DIAGNOSIS — R239 Unspecified skin changes: Secondary | ICD-10-CM | POA: Diagnosis not present

## 2019-02-07 LAB — SURGICAL PATHOLOGY

## 2019-03-01 DIAGNOSIS — M436 Torticollis: Secondary | ICD-10-CM | POA: Diagnosis not present

## 2019-03-30 DIAGNOSIS — H26493 Other secondary cataract, bilateral: Secondary | ICD-10-CM | POA: Diagnosis not present

## 2019-04-01 ENCOUNTER — Emergency Department (HOSPITAL_COMMUNITY): Payer: Medicare Other

## 2019-04-01 ENCOUNTER — Encounter (HOSPITAL_COMMUNITY): Payer: Self-pay | Admitting: Emergency Medicine

## 2019-04-01 ENCOUNTER — Emergency Department (HOSPITAL_COMMUNITY)
Admission: EM | Admit: 2019-04-01 | Discharge: 2019-04-01 | Disposition: A | Discharge status: Home / Self Care | Payer: Medicare Other | Source: Home / Self Care | Attending: Emergency Medicine | Admitting: Emergency Medicine

## 2019-04-01 ENCOUNTER — Other Ambulatory Visit: Payer: Self-pay

## 2019-04-01 DIAGNOSIS — I4891 Unspecified atrial fibrillation: Secondary | ICD-10-CM | POA: Diagnosis not present

## 2019-04-01 DIAGNOSIS — Z85828 Personal history of other malignant neoplasm of skin: Secondary | ICD-10-CM | POA: Insufficient documentation

## 2019-04-01 DIAGNOSIS — I10 Essential (primary) hypertension: Secondary | ICD-10-CM | POA: Insufficient documentation

## 2019-04-01 DIAGNOSIS — Z681 Body mass index (BMI) 19 or less, adult: Secondary | ICD-10-CM | POA: Diagnosis not present

## 2019-04-01 DIAGNOSIS — H469 Unspecified optic neuritis: Secondary | ICD-10-CM | POA: Diagnosis not present

## 2019-04-01 DIAGNOSIS — R05 Cough: Secondary | ICD-10-CM | POA: Diagnosis not present

## 2019-04-01 DIAGNOSIS — R000 Tachycardia, unspecified: Secondary | ICD-10-CM | POA: Diagnosis not present

## 2019-04-01 DIAGNOSIS — R310 Gross hematuria: Secondary | ICD-10-CM | POA: Insufficient documentation

## 2019-04-01 DIAGNOSIS — N400 Enlarged prostate without lower urinary tract symptoms: Secondary | ICD-10-CM

## 2019-04-01 DIAGNOSIS — R519 Headache, unspecified: Secondary | ICD-10-CM | POA: Diagnosis not present

## 2019-04-01 DIAGNOSIS — Z03818 Encounter for observation for suspected exposure to other biological agents ruled out: Secondary | ICD-10-CM | POA: Diagnosis not present

## 2019-04-01 DIAGNOSIS — H543 Unqualified visual loss, both eyes: Secondary | ICD-10-CM | POA: Diagnosis not present

## 2019-04-01 DIAGNOSIS — R319 Hematuria, unspecified: Secondary | ICD-10-CM | POA: Diagnosis not present

## 2019-04-01 DIAGNOSIS — Z20822 Contact with and (suspected) exposure to covid-19: Secondary | ICD-10-CM | POA: Diagnosis not present

## 2019-04-01 DIAGNOSIS — Z79899 Other long term (current) drug therapy: Secondary | ICD-10-CM | POA: Insufficient documentation

## 2019-04-01 DIAGNOSIS — E43 Unspecified severe protein-calorie malnutrition: Secondary | ICD-10-CM | POA: Diagnosis not present

## 2019-04-01 DIAGNOSIS — M316 Other giant cell arteritis: Secondary | ICD-10-CM | POA: Diagnosis not present

## 2019-04-01 LAB — CBC WITH DIFFERENTIAL/PLATELET
Abs Immature Granulocytes: 0.07 10*3/uL (ref 0.00–0.07)
Basophils Absolute: 0 10*3/uL (ref 0.0–0.1)
Basophils Relative: 0 %
Eosinophils Absolute: 0.1 10*3/uL (ref 0.0–0.5)
Eosinophils Relative: 1 %
HCT: 34.2 % — ABNORMAL LOW (ref 39.0–52.0)
Hemoglobin: 11.1 g/dL — ABNORMAL LOW (ref 13.0–17.0)
Immature Granulocytes: 1 %
Lymphocytes Relative: 11 %
Lymphs Abs: 0.8 10*3/uL (ref 0.7–4.0)
MCH: 31.5 pg (ref 26.0–34.0)
MCHC: 32.5 g/dL (ref 30.0–36.0)
MCV: 97.2 fL (ref 80.0–100.0)
Monocytes Absolute: 1.4 10*3/uL — ABNORMAL HIGH (ref 0.1–1.0)
Monocytes Relative: 18 %
Neutro Abs: 5 10*3/uL (ref 1.7–7.7)
Neutrophils Relative %: 69 %
Platelets: 221 10*3/uL (ref 150–400)
RBC: 3.52 MIL/uL — ABNORMAL LOW (ref 4.22–5.81)
RDW: 13.9 % (ref 11.5–15.5)
WBC: 7.4 10*3/uL (ref 4.0–10.5)
nRBC: 0 % (ref 0.0–0.2)

## 2019-04-01 LAB — BASIC METABOLIC PANEL
Anion gap: 11 (ref 5–15)
BUN: 23 mg/dL (ref 8–23)
CO2: 29 mmol/L (ref 22–32)
Calcium: 8.9 mg/dL (ref 8.9–10.3)
Chloride: 98 mmol/L (ref 98–111)
Creatinine, Ser: 0.82 mg/dL (ref 0.61–1.24)
GFR calc Af Amer: >60 mL/min (ref >60)
GFR calc non Af Amer: >60 mL/min (ref >60)
Glucose, Bld: 113 mg/dL — ABNORMAL HIGH (ref 70–99)
Potassium: 3.9 mmol/L (ref 3.5–5.1)
Sodium: 138 mmol/L (ref 135–145)

## 2019-04-01 LAB — URINALYSIS, ROUTINE W REFLEX MICROSCOPIC
Bacteria, UA: NONE SEEN
Bilirubin Urine: NEGATIVE
Glucose, UA: NEGATIVE mg/dL
Ketones, ur: NEGATIVE mg/dL
Leukocytes,Ua: NEGATIVE
Nitrite: NEGATIVE
Protein, ur: NEGATIVE mg/dL
RBC / HPF: >50 RBC/hpf — ABNORMAL HIGH (ref 0–5)
Specific Gravity, Urine: 1.013 (ref 1.005–1.030)
pH: 7 (ref 5.0–8.0)

## 2019-04-01 MED ORDER — SODIUM CHLORIDE 0.9 % IV BOLUS
500.0000 mL | Freq: Once | INTRAVENOUS | Status: AC
  Administered 2019-04-01: 500 mL via INTRAVENOUS

## 2019-04-01 MED ORDER — CEPHALEXIN 250 MG PO CAPS: 250.0000 mg | ORAL_CAPSULE | Freq: Four times a day (QID) | ORAL | 0 refills | Status: DC

## 2019-04-01 NOTE — ED Notes (Signed)
Patient verbalizes understanding of discharge instructions . Opportunity for questions and answers were provided . Armband removed by staff ,Pt discharged from ED. W/C  offered at D/C  and Declined W/C at D/C and was escorted to lobby by RN.  

## 2019-04-01 NOTE — ED Notes (Signed)
  Pt unable to void urinal at bedside  

## 2019-04-01 NOTE — ED Triage Notes (Signed)
Patient reports blood in his urine when he got up to urinate last night, reports hx of the same a while back but he is unsure of the cause. Denies pain at this time.

## 2019-04-01 NOTE — Discharge Instructions (Addendum)
You were seen in the emergency department for blood in your urine.  You had blood work urinalysis and a CAT scan.  You have evidence of a large prostate and we are putting you on antibiotics for a possible infection.  Please drink plenty of fluids.  He will be important for you to schedule an appointment with urology for follow-up.  Return to the emergency department if any new or concerning symptoms.

## 2019-04-01 NOTE — ED Provider Notes (Signed)
[facility] Provider Note   CSN: [id number] Arrival date & time: 04/01/19  1036     History Chief Complaint  Patient presents with  . Hematuria    Matthew Hickman is a 86 y.o. male.  He has a history of skin cancer and hypertension.  He said he woke up around 4 AM like he does most nights to urinate.  This time he had blood in his urine.  He said it happened again this morning.  He said this is happened once in the past.  No pain.  No increase in frequency.  No trauma.  Not on blood thinners.  No fevers or chills.  The history is provided by the patient.  Hematuria This is a recurrent problem. The current episode started 6 to 12 hours ago. The problem occurs rarely. The problem has not changed since onset.Pertinent negatives include no chest pain, no abdominal pain, no headaches and no shortness of breath. Nothing aggravates the symptoms. Nothing relieves the symptoms. He has tried nothing for the symptoms. The treatment provided no relief.       Past Medical History:  Diagnosis Date  . Cancer ([place])    skin  . Hypertension     There are no problems to display for this patient.   Past Surgical History:  Procedure Laterality Date  . APPENDECTOMY    . MELANOMA EXCISION Bilateral 01/30/2019   Procedure: WIDE LOCAL EXCISION WITH ADVANCEMENT FLAP CLOSURE OF MELANOMA LEFT NECK AND RIGHT BACK;  Surgeon: [name], MD;  Location: [facility];  Service: General;  Laterality: Bilateral;       No family history on file.  Social History   Tobacco Use  . Smoking status: Never Smoker  . Smokeless tobacco: Never Used  Substance Use Topics  . Alcohol use: Yes    Comment: 1 glass of bourbon per night  . Drug use: Never    Home Medications Prior to Admission medications   Medication Sig Start Date End Date Taking? Authorizing Provider  hydrochlorothiazide 25 MG tablet Take 25 mg by mouth daily.      [provider]  metoprolol  tartrate (LOPRESSOR) 25 MG tablet Take 25 mg by mouth 2 (two) times daily.  03/20/16   [provider]  Multiple Vitamin (MULTIVITAMIN WITH MINERALS) TABS tablet Take 1 tablet by mouth daily.    [provider]  terazosin (HYTRIN) 1 MG capsule Take 1 mg by mouth at bedtime.    [provider]  traMADol (ULTRAM) 50 MG tablet Take 1-2 tablets (50-100 mg total) by mouth every 6 (six) hours as needed for moderate pain or severe pain. 01/30/19   [name], MD    Allergies    Patient has no known allergies.  Review of Systems   Review of Systems  Constitutional: Negative for fever.  HENT: Negative for sore throat.   Eyes: Positive for visual disturbance (problem with implant).  Respiratory: Negative for shortness of breath.   Cardiovascular: Negative for chest pain.  Gastrointestinal: Negative for abdominal pain.  Genitourinary: Positive for hematuria. Negative for dysuria.  Musculoskeletal: Negative for back pain.  Skin: Negative for rash.  Neurological: Negative for headaches.    Physical Exam Updated Vital Signs BP 133/70 (BP Location: Right Arm)   Pulse 92   Temp 98.1 F (36.7 C) (Oral)   Resp 20   SpO2 97%   Physical Exam Vitals and nursing note reviewed.  Constitutional:  Appearance: He is well-developed.  HENT:     Head: Normocephalic and atraumatic.  Eyes:     Conjunctiva/sclera: Conjunctivae normal.  Cardiovascular:     Rate and Rhythm: Normal rate. Rhythm irregular.     Heart sounds: No murmur.  Pulmonary:     Effort: Pulmonary effort is normal. No respiratory distress.     Breath sounds: Normal breath sounds.  Abdominal:     Palpations: Abdomen is soft.     Tenderness: There is no abdominal tenderness.  Musculoskeletal:        General: No deformity or signs of injury. Normal range of motion.     Cervical back: Neck supple.  Skin:    General: Skin is warm and dry.     Capillary Refill: Capillary refill takes less than 2  seconds.  Neurological:     General: No focal deficit present.     Mental Status: He is alert. Mental status is at baseline.     ED Results / Procedures / Treatments   Labs (all labs ordered are listed, but only abnormal results are displayed) Labs Reviewed  BASIC METABOLIC PANEL - Abnormal; Notable for the following components:      Result Value   Glucose, Bld 113 (*)    All other components within normal limits  CBC WITH DIFFERENTIAL/PLATELET - Abnormal; Notable for the following components:   RBC 3.52 (*)    Hemoglobin 11.1 (*)    HCT 34.2 (*)    Monocytes Absolute 1.4 (*)    All other components within normal limits  URINALYSIS, ROUTINE W REFLEX MICROSCOPIC - Abnormal; Notable for the following components:   Hgb urine dipstick LARGE (*)    RBC / HPF >50 (*)    All other components within normal limits  URINE CULTURE    EKG EKG Interpretation  Date/Time:  Saturday April 01 2019 10:58:16 EST Ventricular Rate:  122 PR Interval:    QRS Duration: 82 QT Interval:  318 QTC Calculation: 453 R Axis:   77 Text Interpretation: Sinus tachycardia with irregular rate Probable anteroseptal infarct, old Since last tracing rate faster 12/20 Confirmed by Aletta Edouard [phone]) on 04/01/2019 11:11:26 AM   Radiology CT Renal Stone Study  Result Date: 04/01/2019 CLINICAL DATA:  86 year old male with hematuria EXAM: CT ABDOMEN AND PELVIS WITHOUT CONTRAST TECHNIQUE: Multidetector CT imaging of the abdomen and pelvis was performed following the standard protocol without IV contrast. COMPARISON:  Prior CT scan of the abdomen and pelvis 09/17/2010 FINDINGS: Lower chest: Mild dependent atelectasis. Minimal subpleural reticulation in the inferior most aspects of the lungs likely represents mild fibrotic change. Mild cardiomegaly. Calcifications noted along the coronary arteries. Calcification also noted within the left inferior papillary muscle. No pericardial effusion. Unremarkable thoracic  esophagus. Hepatobiliary: Normal hepatic contour and morphology. No discrete hepatic lesions. Normal appearance of the gallbladder. No intra or extrahepatic biliary ductal dilatation. Pancreas: Unremarkable. No pancreatic ductal dilatation or surrounding inflammatory changes. Spleen: Normal in size without focal abnormality. Adrenals/Urinary Tract: Unremarkable adrenal glands. No evidence of hydronephrosis, nephrolithiasis or focal renal contour abnormality to suggest an exophytic mass. Evaluate for enhancing lesions is not possible in the absence of intravenous contrast. Both ureters are also unremarkable. The bladder is within normal limits. Stomach/Bowel: There are a few scattered colonic diverticula but no evidence of active inflammation. No focal bowel wall thickening or evidence of obstruction. Vascular/Lymphatic: Limited evaluation in the absence of intravenous contrast. Atherosclerotic calcifications noted along the abdominal aorta. No evidence of suspicious lymphadenopathy.  Reproductive: Marked prostatomegaly. The prostate gland measures 6.0 x 5.2 x 4.6 cm (volume = 75 cm^3) Other: No ascites or abdominal wall hernia. Musculoskeletal: Insert skip bones mild multilevel degenerative changes. IMPRESSION: 1. No nephrolithiasis, hydronephrosis or exophytic renal mass. 2. Marked prostatomegaly. The prostate gland is approximately 75 g in size. This could conceivably represent a source of hematuria. 3. Mild cardiomegaly. 4. Coronary artery calcifications. 5. Mild colonic diverticulosis without evidence of active inflammation. 6. Aortic Atherosclerosis (ICD10-170.0) Electronically Signed   By: Jacqulynn Cadet M.D.   On: 04/01/2019 12:49    Procedures Procedures (including critical care time)  Medications Ordered in ED Medications  sodium chloride 0.9 % bolus 500 mL (0 mLs Intravenous Stopped 04/01/19 1451)    ED Course  I have reviewed the triage vital signs and the nursing notes.  Pertinent labs &  imaging results that were available during my care of the patient were reviewed by me and considered in my medical decision making (see chart for details).  Clinical Course as of Mar 31 1640  Sat Apr 01, 2019  1209 Differential includes UTI, prostatitis, renal stone, renal cell carcinoma.    [MB]  1209 Hemoglobin decreased to 11.1 from 15 few months ago.   [MB]  [id number] CT not showing any obvious renal pathology.  I do comment upon marked prostatic enlargement.   [MB]  [id number] Urinalysis showing greater than 50 reds with minimal signs of infection.  He said he seen a urologist in the past but cannot remember.  Will cover him with antibiotics for possible prostatitis.   [MB]    Clinical Course User Index [MB] Hayden Rasmussen, MD   MDM Rules/Calculators/A&P                      Final Clinical Impression(s) / ED Diagnoses Final diagnoses:  Gross hematuria  Prostate enlargement    Rx / DC Orders ED Discharge Orders         Ordered    cephALEXin (KEFLEX) 250 MG capsule  4 times daily     04/01/19 1416           Hayden Rasmussen, MD 04/01/19 1642

## 2019-04-02 LAB — URINE CULTURE: Culture: NO GROWTH

## 2019-04-04 ENCOUNTER — Inpatient Hospital Stay (HOSPITAL_COMMUNITY)
Admission: EM | Admit: 2019-04-04 | Discharge: 2019-04-10 | DRG: 515 | Disposition: A | Discharge status: Home Health | Payer: Medicare Other | Attending: Internal Medicine | Admitting: Internal Medicine

## 2019-04-04 ENCOUNTER — Emergency Department (HOSPITAL_COMMUNITY): Payer: Medicare Other

## 2019-04-04 ENCOUNTER — Other Ambulatory Visit: Payer: Self-pay

## 2019-04-04 ENCOUNTER — Encounter (HOSPITAL_COMMUNITY): Payer: Self-pay

## 2019-04-04 DIAGNOSIS — R700 Elevated erythrocyte sedimentation rate: Secondary | ICD-10-CM | POA: Diagnosis not present

## 2019-04-04 DIAGNOSIS — D649 Anemia, unspecified: Secondary | ICD-10-CM | POA: Diagnosis present

## 2019-04-04 DIAGNOSIS — Z20822 Contact with and (suspected) exposure to covid-19: Secondary | ICD-10-CM | POA: Diagnosis present

## 2019-04-04 DIAGNOSIS — I471 Supraventricular tachycardia: Secondary | ICD-10-CM | POA: Diagnosis not present

## 2019-04-04 DIAGNOSIS — R7301 Impaired fasting glucose: Secondary | ICD-10-CM | POA: Diagnosis present

## 2019-04-04 DIAGNOSIS — R000 Tachycardia, unspecified: Secondary | ICD-10-CM

## 2019-04-04 DIAGNOSIS — H543 Unqualified visual loss, both eyes: Secondary | ICD-10-CM | POA: Diagnosis present

## 2019-04-04 DIAGNOSIS — E43 Unspecified severe protein-calorie malnutrition: Secondary | ICD-10-CM | POA: Diagnosis present

## 2019-04-04 DIAGNOSIS — H547 Unspecified visual loss: Secondary | ICD-10-CM | POA: Diagnosis not present

## 2019-04-04 DIAGNOSIS — R7982 Elevated C-reactive protein (CRP): Secondary | ICD-10-CM | POA: Diagnosis not present

## 2019-04-04 DIAGNOSIS — Z79899 Other long term (current) drug therapy: Secondary | ICD-10-CM | POA: Diagnosis not present

## 2019-04-04 DIAGNOSIS — I119 Hypertensive heart disease without heart failure: Secondary | ICD-10-CM | POA: Diagnosis present

## 2019-04-04 DIAGNOSIS — M316 Other giant cell arteritis: Principal | ICD-10-CM | POA: Diagnosis present

## 2019-04-04 DIAGNOSIS — Z681 Body mass index (BMI) 19 or less, adult: Secondary | ICD-10-CM

## 2019-04-04 DIAGNOSIS — H4710 Unspecified papilledema: Secondary | ICD-10-CM | POA: Diagnosis not present

## 2019-04-04 DIAGNOSIS — H469 Unspecified optic neuritis: Secondary | ICD-10-CM | POA: Diagnosis present

## 2019-04-04 DIAGNOSIS — Z85820 Personal history of malignant melanoma of skin: Secondary | ICD-10-CM

## 2019-04-04 DIAGNOSIS — R05 Cough: Secondary | ICD-10-CM | POA: Diagnosis not present

## 2019-04-04 DIAGNOSIS — R58 Hemorrhage, not elsewhere classified: Secondary | ICD-10-CM | POA: Diagnosis present

## 2019-04-04 DIAGNOSIS — I493 Ventricular premature depolarization: Secondary | ICD-10-CM | POA: Diagnosis present

## 2019-04-04 DIAGNOSIS — I10 Essential (primary) hypertension: Secondary | ICD-10-CM | POA: Diagnosis not present

## 2019-04-04 DIAGNOSIS — H26493 Other secondary cataract, bilateral: Secondary | ICD-10-CM | POA: Diagnosis not present

## 2019-04-04 DIAGNOSIS — I4891 Unspecified atrial fibrillation: Secondary | ICD-10-CM | POA: Insufficient documentation

## 2019-04-04 DIAGNOSIS — R627 Adult failure to thrive: Secondary | ICD-10-CM | POA: Diagnosis present

## 2019-04-04 DIAGNOSIS — N4889 Other specified disorders of penis: Secondary | ICD-10-CM

## 2019-04-04 DIAGNOSIS — Z7952 Long term (current) use of systemic steroids: Secondary | ICD-10-CM

## 2019-04-04 DIAGNOSIS — Q6689 Other  specified congenital deformities of feet: Secondary | ICD-10-CM | POA: Diagnosis not present

## 2019-04-04 DIAGNOSIS — C439 Malignant melanoma of skin, unspecified: Secondary | ICD-10-CM | POA: Diagnosis not present

## 2019-04-04 DIAGNOSIS — H539 Unspecified visual disturbance: Secondary | ICD-10-CM | POA: Diagnosis not present

## 2019-04-04 DIAGNOSIS — Z91048 Other nonmedicinal substance allergy status: Secondary | ICD-10-CM | POA: Diagnosis not present

## 2019-04-04 DIAGNOSIS — Z03818 Encounter for observation for suspected exposure to other biological agents ruled out: Secondary | ICD-10-CM | POA: Diagnosis not present

## 2019-04-04 DIAGNOSIS — R519 Headache, unspecified: Secondary | ICD-10-CM | POA: Diagnosis present

## 2019-04-04 LAB — CBC
HCT: 34.8 % — ABNORMAL LOW (ref 39.0–52.0)
Hemoglobin: 11.4 g/dL — ABNORMAL LOW (ref 13.0–17.0)
MCH: 31.6 pg (ref 26.0–34.0)
MCHC: 32.8 g/dL (ref 30.0–36.0)
MCV: 96.4 fL (ref 80.0–100.0)
Platelets: 259 10*3/uL (ref 150–400)
RBC: 3.61 MIL/uL — ABNORMAL LOW (ref 4.22–5.81)
RDW: 13.6 % (ref 11.5–15.5)
WBC: 8 10*3/uL (ref 4.0–10.5)
nRBC: 0 % (ref 0.0–0.2)

## 2019-04-04 LAB — COMPREHENSIVE METABOLIC PANEL
ALT: 22 U/L (ref 0–44)
AST: 30 U/L (ref 15–41)
Albumin: 2.7 g/dL — ABNORMAL LOW (ref 3.5–5.0)
Alkaline Phosphatase: 117 U/L (ref 38–126)
Anion gap: 11 (ref 5–15)
BUN: 23 mg/dL (ref 8–23)
CO2: 28 mmol/L (ref 22–32)
Calcium: 8.8 mg/dL — ABNORMAL LOW (ref 8.9–10.3)
Chloride: 96 mmol/L — ABNORMAL LOW (ref 98–111)
Creatinine, Ser: 0.78 mg/dL (ref 0.61–1.24)
GFR calc Af Amer: >60 mL/min (ref >60)
GFR calc non Af Amer: >60 mL/min (ref >60)
Glucose, Bld: 122 mg/dL — ABNORMAL HIGH (ref 70–99)
Potassium: 3.9 mmol/L (ref 3.5–5.1)
Sodium: 135 mmol/L (ref 135–145)
Total Bilirubin: 0.9 mg/dL (ref 0.3–1.2)
Total Protein: 6.9 g/dL (ref 6.5–8.1)

## 2019-04-04 LAB — URINALYSIS, ROUTINE W REFLEX MICROSCOPIC
Bacteria, UA: NONE SEEN
Bilirubin Urine: NEGATIVE
Glucose, UA: NEGATIVE mg/dL
Ketones, ur: NEGATIVE mg/dL
Nitrite: NEGATIVE
Protein, ur: NEGATIVE mg/dL
Specific Gravity, Urine: 1.015 (ref 1.005–1.030)
pH: 6 (ref 5.0–8.0)

## 2019-04-04 LAB — C-REACTIVE PROTEIN: CRP: 13.1 mg/dL — ABNORMAL HIGH (ref <1.0)

## 2019-04-04 LAB — SEDIMENTATION RATE: Sed Rate: 73 mm/hr — ABNORMAL HIGH (ref 0–16)

## 2019-04-04 MED ORDER — DILTIAZEM HCL-DEXTROSE 125-5 MG/125ML-% IV SOLN (PREMIX)
5.0000 mg/h | INTRAVENOUS | Status: DC
  Administered 2019-04-04: 10 mg/h via INTRAVENOUS
  Administered 2019-04-05: 5 mg/h via INTRAVENOUS
  Filled 2019-04-04: qty 125

## 2019-04-04 MED ORDER — ACETAMINOPHEN 325 MG PO TABS: 650.0000 mg | ORAL_TABLET | ORAL | Status: DC | PRN

## 2019-04-04 MED ORDER — SODIUM CHLORIDE 0.9 % IV SOLN
1000.0000 mg | Freq: Once | INTRAVENOUS | Status: AC
  Administered 2019-04-04: 1000 mg via INTRAVENOUS
  Filled 2019-04-04: qty 8

## 2019-04-04 MED ORDER — DILTIAZEM HCL 25 MG/5ML IV SOLN
10.0000 mg | Freq: Once | INTRAVENOUS | Status: AC
  Administered 2019-04-04: 10 mg via INTRAVENOUS
  Filled 2019-04-04: qty 5

## 2019-04-04 MED ORDER — SODIUM CHLORIDE 0.9 % IV BOLUS
500.0000 mL | Freq: Once | INTRAVENOUS | Status: AC
  Administered 2019-04-04: 500 mL via INTRAVENOUS

## 2019-04-04 MED ORDER — APIXABAN 2.5 MG PO TABS
2.5000 mg | ORAL_TABLET | Freq: Two times a day (BID) | ORAL | Status: DC
  Administered 2019-04-05 (×2): 2.5 mg via ORAL
  Filled 2019-04-04 (×2): qty 1

## 2019-04-04 MED ORDER — SODIUM CHLORIDE 0.9 % IV SOLN
1000.0000 mg | Freq: Every day | INTRAVENOUS | Status: DC
  Administered 2019-04-05 – 2019-04-06 (×2): 1000 mg via INTRAVENOUS
  Filled 2019-04-04 (×3): qty 8

## 2019-04-04 MED ORDER — GADOBUTROL 1 MMOL/ML IV SOLN
6.0000 mL | Freq: Once | INTRAVENOUS | Status: AC | PRN
  Administered 2019-04-04: 6 mL via INTRAVENOUS

## 2019-04-04 MED ORDER — ONDANSETRON HCL 4 MG/2ML IJ SOLN: 4.0000 mg | Freq: Four times a day (QID) | INTRAMUSCULAR | Status: DC | PRN

## 2019-04-04 MED ORDER — DILTIAZEM HCL 30 MG PO TABS
30.0000 mg | ORAL_TABLET | Freq: Four times a day (QID) | ORAL | Status: AC
  Administered 2019-04-05 – 2019-04-06 (×8): 30 mg via ORAL
  Filled 2019-04-04 (×8): qty 1

## 2019-04-04 MED ORDER — DILTIAZEM HCL-DEXTROSE 125-5 MG/125ML-% IV SOLN (PREMIX): 5.0000 mg/h | INTRAVENOUS | Status: DC

## 2019-04-04 NOTE — ED Notes (Signed)
Pt returned from MRI @2050 

## 2019-04-04 NOTE — ED Provider Notes (Signed)
[facility] EMERGENCY DEPARTMENT Provider Note   CSN: [id number] Arrival date & time: 04/04/19  1713     History Chief Complaint  Patient presents with  . Loss of Vision    Matthew Hickman is a 86 y.o. male.  Matthew Hickman is a 86 year old gentleman with a past medical history significant for HTN, bilateral cataract surgery (18 years ago), and melanoma s/p excision in December 2020 who presents for bilateral acute vision loss. The patient was seen by his ophthalmologist prior to coming to the ED. His ophthalmologist had concern for GCA and told him to come straight to the ED. His funduscopic exam with optho was notable for bilateral mild edema. The patient has been experiencing bilateral blurred vision since Sunday.  He is also had increased headaches, primarily in bilateral temporal areas.  He also endorses difficulty chewing foods over the last few days as well.  Endorses dry, nonproductive cough has been around for the last few days.  He denies having any fevers, chest pain, SOB, abdominal pain, nausea/vomiting, or changes in his bowel or bladder habits.  Of note, patient was noted to be in A. fib with RVR on telemetry. Patient does not have a history of A. fib and is not on any medications for it.  The patient was recently in the ED on 2/27 for UTI.  He was prescribed Keflex and has been taking that since Saturday.  The history is provided by the patient and a relative. No language interpreter was used.       Past Medical History:  Diagnosis Date  . Cancer ([place])    skin  . Hypertension    There are no problems to display for this patient.  Past Surgical History:  Procedure Laterality Date  . APPENDECTOMY    . MELANOMA EXCISION Bilateral 01/30/2019   Procedure: WIDE LOCAL EXCISION WITH ADVANCEMENT FLAP CLOSURE OF MELANOMA LEFT NECK AND RIGHT BACK;  Surgeon: [name], MD;  Location: [facility];  Service: General;  Laterality: Bilateral;    History reviewed. No  pertinent family history.  Social History   Tobacco Use  . Smoking status: Never Smoker  . Smokeless tobacco: Never Used  Substance Use Topics  . Alcohol use: Yes    Comment: 1 glass of bourbon per night  . Drug use: Never   Home Medications Prior to Admission medications   Medication Sig Start Date End Date Taking? Authorizing Provider  cephALEXin (KEFLEX) 250 MG capsule Take 1 capsule (250 mg total) by mouth 4 (four) times daily. 04/01/19   Hayden Rasmussen, MD  hydrochlorothiazide 25 MG tablet Take 25 mg by mouth daily.      [provider]  metoprolol tartrate (LOPRESSOR) 25 MG tablet Take 25 mg by mouth 2 (two) times daily.  03/20/16   [provider]  Multiple Vitamin (MULTIVITAMIN WITH MINERALS) TABS tablet Take 1 tablet by mouth daily.    [provider]  terazosin (HYTRIN) 1 MG capsule Take 1 mg by mouth at bedtime.    [provider]  traMADol (ULTRAM) 50 MG tablet Take 1-2 tablets (50-100 mg total) by mouth every 6 (six) hours as needed for moderate pain or severe pain. 01/30/19   [name], MD   Allergies    Patient has no known allergies.  Review of Systems   Review of Systems  Constitutional: Negative for appetite change, chills, diaphoresis, fatigue and fever.  HENT: Negative.   Eyes: Negative.   Respiratory: Positive for  cough. Negative for shortness of breath and wheezing.   Cardiovascular: Negative for chest pain, palpitations and leg swelling.  Gastrointestinal: Negative.   Endocrine: Negative.   Genitourinary: Negative.   Musculoskeletal: Negative.   Skin: Negative.   Neurological: Negative.   Hematological: Negative.   Psychiatric/Behavioral: Negative.    Physical Exam Updated Vital Signs BP (!) 141/65 (BP Location: Right Arm)   Pulse 84   Temp 98.2 F (36.8 C) (Oral)   Resp 20   Ht 6' (1.829 m)   Wt 60 kg   SpO2 99%   BMI 17.94 kg/m   Physical Exam Vitals reviewed.  Constitutional:      General: He  is not in acute distress.    Appearance: Normal appearance. He is normal weight. He is not ill-appearing, toxic-appearing or diaphoretic.  HENT:     Head: Normocephalic and atraumatic.  Eyes:     Comments: Pupils dilated to 1mm from Opthalmology visit  Cardiovascular:     Rate and Rhythm: Tachycardia present. Rhythm irregular.     Pulses: Normal pulses.     Heart sounds: Normal heart sounds. No murmur. No friction rub. No gallop.   Pulmonary:     Effort: Pulmonary effort is normal. No respiratory distress.     Breath sounds: Normal breath sounds. No wheezing or rales.  Abdominal:     General: Abdomen is flat. Bowel sounds are normal. There is no distension.     Palpations: Abdomen is soft.     Tenderness: There is no abdominal tenderness.  Musculoskeletal:     Right lower leg: No edema.     Left lower leg: No edema.  Skin:    General: Skin is warm.   Mental Status: Patient is awake, alert, oriented  No signs of aphasia or neglect Cranial Nerves: II: Pupils are fixed and dilated, (from Optho visit earlier) round, and minimally reactive to light III,IV, VI: EOMI without ptosis or diploplia.  V: Facial sensation is symmetric to light touch and  Temperature. VII: Facial movement is symmetric.  VIII: hearing is intact to voice X: Uvula elevates symmetrically XI: Shoulder shrug is symmetric. XII: tongue is midline without atrophy or fasciculations.  Motor: Moves all extremities spontaneously  Sensory: Sensation is grossly intact bilateral UEs & LEs  ED Results / Procedures / Treatments   Labs (all labs ordered are listed, but only abnormal results are displayed) Labs Reviewed  CBC - Abnormal; Notable for the following components:      Result Value   RBC 3.61 (*)    Hemoglobin 11.4 (*)    HCT 34.8 (*)    All other components within normal limits  SEDIMENTATION RATE  C-REACTIVE PROTEIN  COMPREHENSIVE METABOLIC PANEL   EKG EKG Interpretation  Date/Time:  Tuesday April 04 2019 17:20:45 EST Ventricular Rate:  134 PR Interval:    QRS Duration: 70 QT Interval:  296 QTC Calculation: 442 R Axis:   97 Text Interpretation: afib Rightward axis Nonspecific ST abnormality Abnormal ECG No significant change since last tracing Confirmed by Wandra Arthurs [phone]) on 04/04/2019 5:53:08 PM  Radiology No results found.  Medications Ordered in ED Medications  methylPREDNISolone sodium succinate (SOLU-MEDROL) 1,000 mg in sodium chloride 0.9 % 50 mL IVPB (has no administration in time range)   ED Course  I have reviewed the triage vital signs and the nursing notes.  Pertinent labs & imaging results that were available during my care of the patient were reviewed by me and considered in  my medical decision making (see chart for details).    MDM Rules/Calculators/A&P                      The patient's presentation of acute vision loss, headache, and difficulty chewing feeds over the last few days as well as an elevated CRP is suggestive of GCA.  However, the patient's new onset A. fib with RVR, which is concerning for potential embolic stroke as well.  Methylprednisolone 1000 mg has been ordered to treat presumed GCA. MR orbits ordered to evaluate for occlusion.  Diltiazem load and drip ordered. The patient will need admission for further work-up, control of his new onset A. fib with RVR.  Final Clinical Impression(s) / ED Diagnoses Final diagnoses:  None   Rx / DC Orders ED Discharge Orders    None       Earlene Plater, MD 04/04/19 2250    Drenda Freeze, MD 04/04/19 2300

## 2019-04-04 NOTE — ED Triage Notes (Signed)
Pt arrives POV from ophthalmologist office for eval of blt optic nerve pressure/swelling. Daughter reports MD sent here for presumed Giant Cell Arteritis. Per optho note, recommend immediate IV steroids. Pt reports dark/cloudy vision but still able to see some. Pt w/ no other neuro deficits.

## 2019-04-04 NOTE — ED Notes (Signed)
Pt left for MRI.

## 2019-04-04 NOTE — H&P (Signed)
History and Physical   Matthew Hickman [id number] DOB: 03-Dec-1925 DOA: 04/04/2019  Referring MD/NP/PA: Dr. Darl Householder  PCP: [name], MD   Outpatient Specialists: None  Patient coming from: Home  Chief Complaint: Abnormal vision  HPI: Matthew Hickman is a 86 y.o. male with medical history significant of melanoma, hypertension, recent diagnosis of atrial fibrillation, bilateral clubfoot surgery who presented with sudden onset of bilateral visual loss.  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  Patient came to the ER where he was seen and evaluated.  As part of his evaluation he was noted to have atrial fibrillation with rapid ventricular response.  His vision has slightly improved.  Neurology consulted by ER who recommended high-dose steroids.  Patient has also has increased headache with tenderness in his bilateral temporal areas.  He has had difficulty with with eating especially around chewing.  Symptoms have been going on for a few days.  At this point patient is admitted to the medical service and initiated on Cardizem drip..  ED Course: Temperature is 98.8 blood pressure 141/65 pulse 120 respirate 28 oxygen sats 97% on room air.  Urinalysis negative except for RBC of 21-50 and large hemoglobin.  Chest x-ray showed no acute findings.  CRP was 13 white count 8.1 hemoglobin 11.4.  Chemistry largely within normal except for albumin of 2.7.  COVID-19 is negative.  MRI of the orbits has been ordered.  Given a dose of Solu-Medrol and being admitted to the hospital for treatment.  Review of Systems: As per HPI otherwise 10 point review of systems negative.    Past Medical History:  Diagnosis Date  . Cancer ([place])    skin  . Hypertension     Past Surgical History:  Procedure Laterality Date  . APPENDECTOMY    . MELANOMA EXCISION Bilateral 01/30/2019   Procedure: WIDE LOCAL EXCISION WITH ADVANCEMENT FLAP  CLOSURE OF MELANOMA LEFT NECK AND RIGHT BACK;  Surgeon: [name], MD;  Location: [facility];  Service: General;  Laterality: Bilateral;     reports that he has never smoked. He has never used smokeless tobacco. He reports current alcohol use. He reports that he does not use drugs.  Allergies  Allergen Reactions  . Tape Other (See Comments)    PATIENT'S SKIN IS THIN AND IT TEARS EASILY    History reviewed. No pertinent family history.   Prior to Admission medications   Medication Sig Start Date End Date Taking? Authorizing Provider  cephALEXin (KEFLEX) 250 MG capsule Take 1 capsule (250 mg total) by mouth 4 (four) times daily. 04/01/19  Yes Hayden Rasmussen, MD  traMADol (ULTRAM) 50 MG tablet Take 1-2 tablets (50-100 mg total) by mouth every 6 (six) hours as needed for moderate pain or severe pain. 01/30/19  Yes [name], MD  hydrochlorothiazide 25 MG tablet Take 25 mg by mouth daily.      [provider]  metoprolol tartrate (LOPRESSOR) 25 MG tablet Take 25 mg by mouth 2 (two) times daily.  03/20/16   [provider]  Multiple Vitamin (MULTIVITAMIN WITH MINERALS) TABS tablet Take 1 tablet by mouth daily.    [provider]  terazosin (HYTRIN) 1 MG capsule Take 1 mg by mouth at bedtime.    [provider]    Physical Exam: Vitals:   04/04/19 2130 04/04/19 2145 04/04/19 2200 04/04/19 2243  BP: 118/67 128/74 123/69 122/74  Pulse:  65 (!) 113 (!) 53 100  Resp: 20 17 (!) 25 19  Temp:    98.8 F (37.1 C)  TempSrc:    Oral  SpO2: 100% 98% 97% 99%  Weight:      Height:          Constitutional: NAD, calm, comfortable Vitals:   04/04/19 2130 04/04/19 2145 04/04/19 2200 04/04/19 2243  BP: 118/67 128/74 123/69 122/74  Pulse: 65 (!) 113 (!) 53 100  Resp: 20 17 (!) 25 19  Temp:    98.8 F (37.1 C)  TempSrc:    Oral  SpO2: 100% 98% 97% 99%  Weight:      Height:       Eyes: Decreased visual acuity, periorbital tenderness PERRL, lids and  conjunctivae normal ENMT: Mucous membranes are moist. Posterior pharynx clear of any exudate or lesions.Normal dentition.  Neck: normal, supple, no masses, no thyromegaly Respiratory: clear to auscultation bilaterally, no wheezing, no crackles. Normal respiratory effort. No accessory muscle use.  Cardiovascular: Irregularly irregular rate and rhythm, no murmurs / rubs / gallops. No extremity edema. 2+ pedal pulses. No carotid bruits.  Abdomen: no tenderness, no masses palpated. No hepatosplenomegaly. Bowel sounds positive.  Musculoskeletal: no clubbing / cyanosis. No joint deformity upper and lower extremities. Good ROM, no contractures. Normal muscle tone.  Skin: no rashes, lesions, ulcers. No induration Neurologic: CN 2-12 grossly intact. Sensation intact, DTR normal. Strength 5/5 in all 4.  Psychiatric: Normal judgment and insight. Alert and oriented x 3. Normal mood.     Labs on Admission: I have personally reviewed following labs and imaging studies  CBC: Recent Labs  Lab 04/01/19 1110 04/04/19 1735  WBC 7.4 8.0  NEUTROABS 5.0  --   HGB 11.1* 11.4*  HCT 34.2* 34.8*  MCV 97.2 96.4  PLT 221 [id number]   Basic Metabolic Panel: Recent Labs  Lab 04/01/19 1110 04/04/19 1735  NA 138 135  K 3.9 3.9  CL 98 96*  CO2 29 28  GLUCOSE 113* 122*  BUN 23 23  CREATININE 0.82 0.78  CALCIUM 8.9 8.8*   GFR: Estimated Creatinine Clearance: 51 mL/min (by C-G formula based on SCr of 0.78 mg/dL). Liver Function Tests: Recent Labs  Lab 04/04/19 1735  AST 30  ALT 22  ALKPHOS 117  BILITOT 0.9  PROT 6.9  ALBUMIN 2.7*   No results for input(s): LIPASE, AMYLASE in the last 168 hours. No results for input(s): AMMONIA in the last 168 hours. Coagulation Profile: No results for input(s): INR, PROTIME in the last 168 hours. Cardiac Enzymes: No results for input(s): CKTOTAL, CKMB, CKMBINDEX, TROPONINI in the last 168 hours. BNP (last 3 results) No results for input(s): PROBNP in the last 8760  hours. HbA1C: No results for input(s): HGBA1C in the last 72 hours. CBG: No results for input(s): GLUCAP in the last 168 hours. Lipid Profile: No results for input(s): CHOL, HDL, LDLCALC, TRIG, CHOLHDL, LDLDIRECT in the last 72 hours. Thyroid Function Tests: No results for input(s): TSH, T4TOTAL, FREET4, T3FREE, THYROIDAB in the last 72 hours. Anemia Panel: No results for input(s): VITAMINB12, FOLATE, FERRITIN, TIBC, IRON, RETICCTPCT in the last 72 hours. Urine analysis:    Component Value Date/Time   COLORURINE YELLOW 04/04/2019 1949   APPEARANCEUR CLEAR 04/04/2019 1949   LABSPEC 1.015 04/04/2019 1949   PHURINE 6.0 04/04/2019 1949   GLUCOSEU NEGATIVE 04/04/2019 1949   HGBUR LARGE (A) 04/04/2019 1949   BILIRUBINUR NEGATIVE 04/04/2019 [place] NEGATIVE 04/04/2019 1949  PROTEINUR NEGATIVE 04/04/2019 1949   UROBILINOGEN 1.0 09/20/2010 1226   NITRITE NEGATIVE 04/04/2019 1949   LEUKOCYTESUR TRACE (A) 04/04/2019 1949   Sepsis Labs: @LABRCNTIP (procalcitonin:4,lacticidven:4) ) Recent Results (from the past 240 hour(s))  Urine culture     Status: None   Collection Time: 04/01/19  1:30 PM   Specimen: Urine, Random  Result Value Ref Range Status   Specimen Description URINE, RANDOM  Final   Special Requests NONE  Final   Culture   Final    NO GROWTH Performed at [facility], [place] [address], [place], [place] [address]    Report Status 04/02/2019 FINAL  Final     Radiological Exams on Admission: DG Chest Port 1 View  Result Date: 04/04/2019 CLINICAL DATA:  86 year old male with cough. EXAM: PORTABLE CHEST 1 VIEW COMPARISON:  Chest radiograph dated 09/17/2010. FINDINGS: Minimal bibasilar atelectasis. No focal consolidation, pleural effusion, or pneumothorax. Stable cardiac silhouette. Atherosclerotic calcification of the aorta. No acute osseous pathology. IMPRESSION: No acute cardiopulmonary process. Electronically Signed   By: Anner Crete M.D.   On: 04/04/2019  19:08   MR ORBITS W WO CONTRAST  Result Date: 04/04/2019 CLINICAL DATA:  Headache.  Basilar orbital giant cell arteritis. EXAM: MRI OF THE ORBITS WITHOUT AND WITH CONTRAST TECHNIQUE: Multiplanar, multisequence MR imaging of the orbits was performed both before and after the administration of intravenous contrast. CONTRAST:  47mL GADAVIST GADOBUTROL 1 MMOL/ML IV SOLN COMPARISON:  None. FINDINGS: Bilateral cataract extraction. Normal shape and signal in the globe without mass lesion. No orbital mass or edema. Orbital fat normal. No enhancing mass in the orbit. Optic nerve normal in size and signal. Following contrast infusion there is prominent perineural enhancement of the optic nerve bilaterally. The optic nerve does not enhance significantly. Optic chiasm normal. Cavernous sinus normal. Limited imaging of the brain demonstrates generalized atrophy. No acute infarct on diffusion-weighted imaging. Postcontrast imaging of the brain reveals diffuse dural thickening surrounding both cerebral hemispheres right greater than left. Nodular enhancement of the right frontal dura measuring approximately 11 mm, and nodular enhancement of the right parietal dura measuring approximately 8 x 11 mm. Normal enhancement of the optic chiasm. IMPRESSION: Abnormal perineural enhancement around the optic nerve bilaterally. The nerve itself does not enhance and shows normal signal on T2. This pattern could be seen with vasculitis such as giant cell arteritis, Wegener's granulomatosis, sarcoidosis. In addition, there is abnormal dural thickening and enhancement around both cerebral hemispheres bilaterally, with areas of nodular dural based enhancement in the right frontal right parietal lobe. Differential includes meningioma as well as metastatic disease to the dura. Given the optic nerve Perineuritis, the dural enhancement may also be related to the same process favoring vasculitis. These results were called by telephone at the time of  interpretation on 04/04/2019 at 9:21 pm to provider DAVID YAO , who verbally acknowledged these results. Electronically Signed   By: Franchot Gallo M.D.   On: 04/04/2019 21:21    EKG: Independently reviewed.  It shows atrial fibrillation with rapid ventricular response  Assessment/Plan Principal Problem:   Rapid atrial fibrillation (HCC) Active Problems:   Giant cell arteritis (HCC)   Benign essential HTN     #1 atrial fibrillation with rapid response: Patient will be admitted to progressive care unit.  Initiate Cardizem drip.  Her current oral Cardizem to titrate off the drip.  Initiate low-dose Eliquis.  Continue treatment and get echocardiogram in the morning.  #2 giant cell arteritis: Confirmed by MRI.  Will follow neurology recommendation with high-dose steroids.  Responsive.  #3 essential hypertension: Blood pressure appears controlled.  Continue with home regimen.   DVT prophylaxis: Eliquis Code Status: Full code Family Communication: Niece who is with patient in the room Disposition Plan: Home Consults called: None but neurology curbside Admission status: Inpatient  Severity of Illness: The appropriate patient status for this patient is INPATIENT. Inpatient status is judged to be reasonable and necessary in order to provide the required intensity of service to ensure the patient's safety. The patient's presenting symptoms, physical exam findings, and initial radiographic and laboratory data in the context of their chronic comorbidities is felt to place them at high risk for further clinical deterioration. Furthermore, it is not anticipated that the patient will be medically stable for discharge from the hospital within 2 midnights of admission. The following factors support the patient status of inpatient.   " The patient's presenting symptoms include headache and visual loss. " The worrisome physical exam findings include decreased visual acuity with rapid A. fib. " The  initial radiographic and laboratory data are worrisome because of MRI concern with giant cell arteritis. " The chronic co-morbidities include hypertension.   * I certify that at the point of admission it is my clinical judgment that the patient will require inpatient hospital care spanning beyond 2 midnights from the point of admission due to high intensity of service, high risk for further deterioration and high frequency of surveillance required.Barbette Merino MD [facility] Pager [phone]  If 7PM-7AM, please contact night-coverage [email or web address] Password [email or web address]  04/04/2019, 11:01 PM

## 2019-04-05 ENCOUNTER — Inpatient Hospital Stay (HOSPITAL_COMMUNITY): Payer: Medicare Other

## 2019-04-05 ENCOUNTER — Encounter (HOSPITAL_COMMUNITY): Payer: Self-pay | Admitting: Internal Medicine

## 2019-04-05 ENCOUNTER — Other Ambulatory Visit: Payer: Self-pay

## 2019-04-05 DIAGNOSIS — H547 Unspecified visual loss: Secondary | ICD-10-CM

## 2019-04-05 DIAGNOSIS — I4891 Unspecified atrial fibrillation: Secondary | ICD-10-CM

## 2019-04-05 DIAGNOSIS — I471 Supraventricular tachycardia: Secondary | ICD-10-CM

## 2019-04-05 DIAGNOSIS — M316 Other giant cell arteritis: Principal | ICD-10-CM

## 2019-04-05 LAB — ECHOCARDIOGRAM COMPLETE
Height: 72 in
Weight: 2054.4 oz

## 2019-04-05 LAB — TSH: TSH: 1.629 u[IU]/mL (ref 0.350–4.500)

## 2019-04-05 LAB — SARS CORONAVIRUS 2 (TAT 6-24 HRS): SARS Coronavirus 2: NEGATIVE

## 2019-04-05 MED ORDER — ENSURE ENLIVE PO LIQD
237.0000 mL | Freq: Three times a day (TID) | ORAL | Status: DC
  Administered 2019-04-05 – 2019-04-10 (×12): 237 mL via ORAL

## 2019-04-05 MED ORDER — ADULT MULTIVITAMIN W/MINERALS CH
1.0000 | ORAL_TABLET | Freq: Every day | ORAL | Status: DC
  Administered 2019-04-05 – 2019-04-10 (×6): 1 via ORAL
  Filled 2019-04-05 (×6): qty 1

## 2019-04-05 MED ORDER — METOPROLOL TARTRATE 25 MG PO TABS
25.0000 mg | ORAL_TABLET | Freq: Two times a day (BID) | ORAL | Status: DC
  Administered 2019-04-05 – 2019-04-10 (×11): 25 mg via ORAL
  Filled 2019-04-05 (×10): qty 1

## 2019-04-05 MED ORDER — SODIUM CHLORIDE 0.9 % IV SOLN
INTRAVENOUS | Status: DC | PRN
  Administered 2019-04-05 – 2019-04-06 (×2): 250 mL via INTRAVENOUS

## 2019-04-05 NOTE — Progress Notes (Signed)
To the best of my knowledge, the student's charting is accurate.  

## 2019-04-05 NOTE — Progress Notes (Signed)
Initial Nutrition Assessment  DOCUMENTATION CODES:   Underweight, Severe malnutrition in context of chronic illness  INTERVENTION:   -Downgrade diet to dysphagia 3 (advanced mechanical soft) for ease of intake -MVI with minerals daily -Ensure Enlive po TID, each supplement provides 350 kcal and 20 grams of protein -Magic cup TID with meals, each supplement provides 290 kcal and 9 grams of protein  NUTRITION DIAGNOSIS:   Severe Malnutrition related to chronic illness(melanoma) as evidenced by energy intake < or equal to 75% for > or equal to 1 month, severe fat depletion, severe muscle depletion.  GOAL:   Patient will meet greater than or equal to 90% of their needs  MONITOR:   PO intake, Supplement acceptance, Labs, Weight trends, Skin, I & O's  REASON FOR ASSESSMENT:   Other (Comment)    ASSESSMENT:   Matthew Hickman is a 86 y.o. male with medical history significant of melanoma, hypertension, recent diagnosis of atrial fibrillation, bilateral clubfoot surgery who presented with sudden onset of bilateral visual loss.  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  Patient came to the ER where he was seen and evaluated.  As part of his evaluation he was noted to have atrial fibrillation with rapid ventricular response.  His vision has slightly improved.  Neurology consulted by ER who recommended high-dose steroids.  Patient has also has increased headache with tenderness in his bilateral temporal areas.  He has had difficulty with with eating especially around chewing.  Symptoms have been going on for a few days.  At this point patient is admitted to the medical service and initiated on Cardizem drip..  Pt admitted with a-fib with RVR.   Reviewed I/O's: +387 ml x 24 hours   UOP: 200 ml x 24 hours  Spoke with pt at bedside, who was pleasant and in good spirits today ("I feel like getting out and  walking out of here, hopefully"). Pt shares that he has had a decreased appetite over the past 1-2 years, however, is not very alarmed by it as he is getting older and unable to be as active as he used to be. He reports that he underwent jaw surgery for cancer in December 2020 and has difficulty opening his mouth very wide and is also missing teeth, which make it difficult to consume solid foods. He consumes soft, solid grab and go items and fluids throughout the day.Per his report, he consumed applesauce and fruit juice off his tray at breakfast, as the toast and sausage were too tough for him to eat.    Diet recall is as follows: 8 AM: glass of milk and 2 cookies; 9:30 AM: bottle of Pepsi; 11 AM: bottle of Ensure Plus; 1 PM: fruit cup; 2:30 PM: bottle of Ensure Plus; 5 PM: TV dinner (pt reports he usually consumes about half of it); 9-10:30 PM: 2-3 glasses of Pepsi and bourbon (last drink evening PTA).   Pt endorses wt loss and reports his UBW is around 145#, which he last weighed around 2 years ago. Pt endorses a slow, gradual weight loss over this time period. Since December 2020, pt estimates he has lost about 2-3 pounds, however, per documented wt hx, wt has been stable over the 3 months.   Discussed with pt importance of good meal and supplement intake to promote healing. Pt amenable to continue Ensure supplements; he does not prefer these supplements, but drinks them  as he knows that this is how he receives a good majority of his nutrition. Also discussed foods that were appealing to him that were easier to chew and swallow for ease of intake and to preserve energy. He is amenable to diet downgrade.   Medications reviewed and include cardizem and solu-medrol.   Labs reviewed.   NUTRITION - FOCUSED PHYSICAL EXAM:    Most Recent Value  Orbital Region  Severe depletion  Upper Arm Region  Severe depletion  Thoracic and Lumbar Region  Severe depletion  Buccal Region  Severe depletion  Temple  Region  Severe depletion  Clavicle Bone Region  Severe depletion  Clavicle and Acromion Bone Region  Severe depletion  Scapular Bone Region  Severe depletion  Dorsal Hand  Severe depletion  Patellar Region  Severe depletion  Anterior Thigh Region  Severe depletion  Posterior Calf Region  Severe depletion  Edema (RD Assessment)  None  Hair  Reviewed  Eyes  Reviewed  Mouth  Reviewed  Skin  Reviewed  Nails  Reviewed       Diet Order:   Diet Order            DIET DYS 3 Room service appropriate? Yes with Assist; Fluid consistency: Thin  Diet effective now              EDUCATION NEEDS:   Education needs have been addressed  Skin:  Skin Assessment: Reviewed RN Assessment  Last BM:  04/03/19  Height:   Ht Readings from Last 1 Encounters:  04/05/19 6' (1.829 m)    Weight:   Wt Readings from Last 1 Encounters:  04/05/19 58.2 kg    Ideal Body Weight:  80.9 kg  BMI:  Body mass index is 17.41 kg/m.  Estimated Nutritional Needs:   Kcal:  1700-1900  Protein:  85-100 grams  Fluid:  > 1.7 L    Loistine Chance, RD, LDN, [place] Registered Dietitian II Certified Diabetes Care and Education Specialist Please refer to [facility] for RD and/or RD on-call/weekend/after hours pager

## 2019-04-05 NOTE — Evaluation (Signed)
Physical Therapy Evaluation Patient Details Name: Matthew Hickman MRN: [id number] DOB: [birth year] Today's Date: 04/05/2019   History of Present Illness  Pt is 86 yo male admitted with afib RVR.  Pt with PMH including HTN, sking CA, bil clubfoot surgery.  Clinical Impression  Pt admitted with above diagnosis. Pt normally active individual and helps to take care of spouse at home.  He presented with mild unsteadiness and decreased endurance.  Encouraged use of RW at home.  Also recommended HHPT, but pt reports not interested right now.  Pt currently with functional limitations due to the deficits listed below (see PT Problem List). Pt will benefit from skilled PT to increase their independence and safety with mobility to allow discharge to the venue listed below.       Follow Up Recommendations Home health PT(if agreeable (he declined today))    Equipment Recommendations  None recommended by PT    Recommendations for Other Services       Precautions / Restrictions Precautions Precautions: Fall      Mobility  Bed Mobility Overal bed mobility: Needs Assistance Bed Mobility: Supine to Sit;Sit to Supine     Supine to sit: Min guard;HOB elevated Sit to supine: Min guard;HOB elevated      Transfers Overall transfer level: Needs assistance Equipment used: None Transfers: Sit to/from Stand Sit to Stand: Min assist         General transfer comment: min A for steadying; cues for safe hand placement  Ambulation/Gait Ambulation/Gait assistance: Min guard Gait Distance (Feet): 180 Feet Assistive device: None Gait Pattern/deviations: Wide base of support;Trunk flexed;Decreased stride length Gait velocity: decreased   General Gait Details: unsteady but no LOB; cued for safety; educated to use RW at home he agreed; reports feels mobilty limited b/c stiff from being in bed  Stairs            Wheelchair Mobility    Modified Rankin (Stroke Patients Only)       Balance  Overall balance assessment: Needs assistance Sitting-balance support: No upper extremity supported;Feet supported Sitting balance-Leahy Scale: Good     Standing balance support: Single extremity supported;During functional activity Standing balance-Leahy Scale: Fair                               Pertinent Vitals/Pain Pain Assessment: No/denies pain    Home Living Family/patient expects to be discharged to:: Private residence Living Arrangements: Children;Spouse/significant other Available Help at Discharge: Family;Available PRN/intermittently(reports son/dtr-in-law live in other half of house and can easily be reached) Type of Home: House Home Access: Level entry     Home Layout: Multi-level Home Equipment: Grab bars - tub/shower;Grab bars - toilet;Walker - 2 wheels;Cane - single point      Prior Function Level of Independence: Independent         Comments: Pt assist his wife who is in w/c.  Still drives and grocery shops.  Does light cooking and cleaning.     Hand Dominance        Extremity/Trunk Assessment   Upper Extremity Assessment Upper Extremity Assessment: Overall WFL for tasks assessed    Lower Extremity Assessment Lower Extremity Assessment: Overall WFL for tasks assessed    Cervical / Trunk Assessment Cervical / Trunk Assessment: Normal  Communication   Communication: HOH  Cognition Arousal/Alertness: Awake/alert Behavior During Therapy: WFL for tasks assessed/performed Overall Cognitive Status: Within Functional Limits for tasks assessed  General Comments General comments (skin integrity, edema, etc.): HR 98-105 bpm    Exercises     Assessment/Plan    PT Assessment Patient needs continued PT services  PT Problem List Decreased strength;Decreased mobility;Decreased safety awareness;Decreased coordination;Decreased activity tolerance;Cardiopulmonary status limiting  activity;Decreased balance;Decreased knowledge of use of DME       PT Treatment Interventions DME instruction;Therapeutic activities;Gait training;Therapeutic exercise;Patient/family education;Stair training;Balance training;Functional mobility training    PT Goals (Current goals can be found in the Care Plan section)  Acute Rehab PT Goals Patient Stated Goal: return home PT Goal Formulation: With patient Time For Goal Achievement: 04/19/19 Potential to Achieve Goals: Good    Frequency Min 3X/week   Barriers to discharge        Co-evaluation               AM-PAC PT "6 Clicks" Mobility  Outcome Measure Help needed turning from your back to your side while in a flat bed without using bedrails?: None Help needed moving from lying on your back to sitting on the side of a flat bed without using bedrails?: None Help needed moving to and from a bed to a chair (including a wheelchair)?: A Little Help needed standing up from a chair using your arms (e.g., wheelchair or bedside chair)?: A Little Help needed to walk in hospital room?: A Little Help needed climbing 3-5 steps with a railing? : A Little 6 Click Score: 20    End of Session Equipment Utilized During Treatment: Gait belt Activity Tolerance: Patient tolerated treatment well Patient left: in bed;with call bell/phone within reach;with bed alarm set Nurse Communication: Mobility status PT Visit Diagnosis: Unsteadiness on feet (R26.81)    Time: 1735-1800 PT Time Calculation (min) (ACUTE ONLY): 25 min   Charges:   PT Evaluation $PT Eval Low Complexity: 1 Low          Maggie Font, PT Acute Rehab Services Pager [phone] [facility] [phone] [facility] [place] 04/05/2019, 6:03 PM

## 2019-04-05 NOTE — Progress Notes (Signed)
PROGRESS NOTE  Matthew Hickman DQQ:[id number] DOB: 02-03-1926 DOA: 04/04/2019 PCP: [name], MD  HPI/Recap of past 24 hours:  Reports headache is better, vision is a little better  Assessment/Plan: Principal Problem:   Rapid atrial fibrillation ([place]) Active Problems:   Giant cell arteritis (H[facility])   Benign essential HTN   Giant cell arteritis/optic neuritis Esr 73/crp 13 Case discussed with patient ophthalmology who recommended neurology consult Neurology formally consulted  Afib/RVR vs frequent PACs He denies chest pain, no dizziness, no short of breath Consult cardiology  Hypertension: Stable on current medication  DVT Prophylaxis: Currently on elect Eliquis  Code Status: full  Family Communication: niece over the phone  Disposition Plan:    Patient came from:          home                                                                                                Anticipated d/c place:  TBD  Barriers to d/c OR conditions which need to be met to effect a safe d/c:  Needs neurology and cardiology consult, needs symptom improvement, will get PT eval   Consultants:  Cardiology  Ophthalmology Dr Satira Sark over the phone  Neurology Dr Aroor  Procedures:  None  Antibiotics:  None   Objective: BP (!) 122/59 (BP Location: Right Arm)   Pulse 88   Temp 97.9 F (36.6 C) (Oral)   Resp 18   Ht 6' (1.829 m)   Wt 58.2 kg Comment: scale a  SpO2 100%   BMI 17.41 kg/m   Intake/Output Summary (Last 24 hours) at 04/05/2019 1941 Last data filed at 04/05/2019 0600 Gross per 24 hour  Intake 586.6 ml  Output 200 ml  Net 386.6 ml   Filed Weights   04/04/19 1727 04/05/19 0100  Weight: 60 kg 58.2 kg    Exam: Patient is examined daily including today on 04/05/2019, exams remain the same as of yesterday except that has changed    General:  NAD, hard of hearing  Cardiovascular: IRRR  Respiratory: CTABL  Abdomen: Soft/ND/NT, positive BS  Musculoskeletal:  trache bilateral ankle pitting edema, chronic venous stasis changes  Neuro: alert, oriented x3  Data Reviewed: Basic Metabolic Panel: Recent Labs  Lab 04/01/19 1110 04/04/19 1735  NA 138 135  K 3.9 3.9  CL 98 96*  CO2 29 28  GLUCOSE 113* 122*  BUN 23 23  CREATININE 0.82 0.78  CALCIUM 8.9 8.8*   Liver Function Tests: Recent Labs  Lab 04/04/19 1735  AST 30  ALT 22  ALKPHOS 117  BILITOT 0.9  PROT 6.9  ALBUMIN 2.7*   No results for input(s): LIPASE, AMYLASE in the last 168 hours. No results for input(s): AMMONIA in the last 168 hours. CBC: Recent Labs  Lab 04/01/19 1110 04/04/19 1735  WBC 7.4 8.0  NEUTROABS 5.0  --   HGB 11.1* 11.4*  HCT 34.2* 34.8*  MCV 97.2 96.4  PLT 221 259   Cardiac Enzymes:   No results for input(s): CKTOTAL, CKMB, CKMBINDEX, TROPONINI in the last 168 hours. BNP (last 3  results) No results for input(s): BNP in the last 8760 hours.  ProBNP (last 3 results) No results for input(s): PROBNP in the last 8760 hours.  CBG: No results for input(s): GLUCAP in the last 168 hours.  Recent Results (from the past 240 hour(s))  Urine culture     Status: None   Collection Time: 04/01/19  1:30 PM   Specimen: Urine, Random  Result Value Ref Range Status   Specimen Description URINE, RANDOM  Final   Special Requests NONE  Final   Culture   Final    NO GROWTH Performed at [facility], [address], [place], [place] [address]    Report Status 04/02/2019 FINAL  Final  SARS CORONAVIRUS 2 (TAT 6-24 HRS) Nasopharyngeal Nasopharyngeal Swab     Status: None   Collection Time: 04/04/19  7:23 PM   Specimen: Nasopharyngeal Swab  Result Value Ref Range Status   SARS Coronavirus 2 NEGATIVE NEGATIVE Final    Comment: (NOTE) SARS-CoV-2 target nucleic acids are NOT DETECTED. The SARS-CoV-2 RNA is generally detectable in upper and lower respiratory specimens during the acute phase of infection. Negative results do not preclude SARS-CoV-2 infection,  do not rule out co-infections with other pathogens, and should not be used as the sole basis for treatment or other patient management decisions. Negative results must be combined with clinical observations, patient history, and epidemiological information. The expected result is Negative. Fact Sheet for Patients: SugarRoll.be Fact Sheet for Healthcare Providers: https://www.woods-mathews.com/ This test is not yet approved or cleared by the [place] FDA and  has been authorized for detection and/or diagnosis of SARS-CoV-2 by FDA under an Emergency Use Authorization (EUA). This EUA will remain  in effect (meaning this test can be used) for the duration of the COVID-19 declaration under Section 56 4(b)(1) of the Act, 21 U.S.C. section 360bbb-3(b)(1), unless the authorization is terminated or revoked sooner. Performed at [facility], [place] [address] [place], [place] [address]      Studies: DG Chest Port 1 View  Result Date: 04/04/2019 CLINICAL DATA:  86 year old male with cough. EXAM: PORTABLE CHEST 1 VIEW COMPARISON:  Chest radiograph dated 09/17/2010. FINDINGS: Minimal bibasilar atelectasis. No focal consolidation, pleural effusion, or pneumothorax. Stable cardiac silhouette. Atherosclerotic calcification of the aorta. No acute osseous pathology. IMPRESSION: No acute cardiopulmonary process. Electronically Signed   By: Anner Crete M.D.   On: 04/04/2019 19:08   MR ORBITS W WO CONTRAST  Result Date: 04/04/2019 CLINICAL DATA:  Headache.  Basilar orbital giant cell arteritis. EXAM: MRI OF THE ORBITS WITHOUT AND WITH CONTRAST TECHNIQUE: Multiplanar, multisequence MR imaging of the orbits was performed both before and after the administration of intravenous contrast. CONTRAST:  67m GADAVIST GADOBUTROL 1 MMOL/ML IV SOLN COMPARISON:  None. FINDINGS: Bilateral cataract extraction. Normal shape and signal in the globe without mass lesion. No  orbital mass or edema. Orbital fat normal. No enhancing mass in the orbit. Optic nerve normal in size and signal. Following contrast infusion there is prominent perineural enhancement of the optic nerve bilaterally. The optic nerve does not enhance significantly. Optic chiasm normal. Cavernous sinus normal. Limited imaging of the brain demonstrates generalized atrophy. No acute infarct on diffusion-weighted imaging. Postcontrast imaging of the brain reveals diffuse dural thickening surrounding both cerebral hemispheres right greater than left. Nodular enhancement of the right frontal dura measuring approximately 11 mm, and nodular enhancement of the right parietal dura measuring approximately 8 x 11 mm. Normal enhancement of the optic chiasm.  IMPRESSION: Abnormal perineural enhancement around the optic nerve bilaterally. The nerve itself does not enhance and shows normal signal on T2. This pattern could be seen with vasculitis such as giant cell arteritis, Wegener's granulomatosis, sarcoidosis. In addition, there is abnormal dural thickening and enhancement around both cerebral hemispheres bilaterally, with areas of nodular dural based enhancement in the right frontal right parietal lobe. Differential includes meningioma as well as metastatic disease to the dura. Given the optic nerve Perineuritis, the dural enhancement may also be related to the same process favoring vasculitis. These results were called by telephone at the time of interpretation on 04/04/2019 at 9:21 pm to provider DAVID YAO , who verbally acknowledged these results. Electronically Signed   By: Franchot Gallo M.D.   On: 04/04/2019 21:21    Scheduled Meds: . apixaban  2.5 mg Oral BID  . diltiazem  30 mg Oral Q6H    Continuous Infusions: . diltiazem (CARDIZEM) infusion Stopped (04/05/19 0106)  . methylPREDNISolone (SOLU-MEDROL) injection       Time spent: 2mns I have personally reviewed and interpreted on  04/05/2019 daily labs, tele  strips, imagings as discussed above under date review session and assessment and plans.  I reviewed all nursing notes, pharmacy notes, consultant notes,  vitals, pertinent old records  I have discussed plan of care as described above with RN , patient and family on 04/05/2019   FFlorencia ReasonsMD, PhD, FACP  [facility]  Available via Epic secure chat 7am-7pm for nonurgent issues Please page for urgent issues, pager number available through [email or web address] .   04/05/2019, 7:42 AM  LOS: 1 day

## 2019-04-05 NOTE — Consult Note (Signed)
Neurology Consultation  Reason for Consult: Decreased vision Referring Physician: Dr. Florencia Reasons, [facility]  CC: Decreased vision b/l  History is obtained from: Patient and chart  HPI: Matthew Hickman is a 86 y.o. male with history of hypertension and skin cancer.  Patient presented to the emergency room secondary to sudden onset of bilateral vision loss.  He had went to see his ophthalmologist who examined him and suspected giant cell arteritis.  Neurology was consulted secondary to the decreased vision and suspected giant cell arteritis.  The patient was able to state that his vision had decreased over the last few days.  He describes it as "looking through a dark curtain".  Patient denied any pain in the temporal regions, tenderness of the scalp with palpation and did admit to having claudication on the left aspect of his jaw when chewing but believes this was secondary to surgery that he had had in the past.  He denies any claudication on the right side of his jaw when chewing.  Patient denies any headache, swallowing problems, double vision. No headaches. Not had headaches beofre  Chart review (no prior neurological notes)  Work up that has been done: MR orbits w+w/o   Past Medical History:  Diagnosis Date  . Cancer ([place])    skin  . Hypertension     Family History  Problem Relation Age of Onset  . Heart attack Father        Died at age 19  . Heart failure Father    Social History:   reports that he has never smoked. He has never used smokeless tobacco. He reports current alcohol use. He reports that he does not use drugs.  Medications  Current Facility-Administered Medications:  .  acetaminophen (TYLENOL) tablet 650 mg, 650 mg, Oral, Q4H PRN, Jonelle Sidle, Mohammad L, MD .  apixaban (ELIQUIS) tablet 2.5 mg, 2.5 mg, Oral, BID, Jonelle Sidle, Mohammad L, MD, 2.5 mg at 04/05/19 0933 .  diltiazem (CARDIZEM) 125 mg in dextrose 5% 125 mL (1 mg/mL) infusion, 5-15 mg/hr, Intravenous,  Continuous, Drenda Freeze, MD, Stopped at 04/05/19 0106 .  diltiazem (CARDIZEM) tablet 30 mg, 30 mg, Oral, Q6H, Garba, Mohammad L, MD, 30 mg at 04/05/19 1138 .  feeding supplement (ENSURE ENLIVE) (ENSURE ENLIVE) liquid 237 mL, 237 mL, Oral, TID BM, Florencia Reasons, MD, 237 mL at 04/05/19 1409 .  methylPREDNISolone sodium succinate (SOLU-MEDROL) 1,000 mg in sodium chloride 0.9 % 50 mL IVPB, 1,000 mg, Intravenous, Daily, Garba, Mohammad L, MD .  metoprolol tartrate (LOPRESSOR) tablet 25 mg, 25 mg, Oral, BID, Daune Perch, NP .  multivitamin with minerals tablet 1 tablet, 1 tablet, Oral, Daily, Florencia Reasons, MD, 1 tablet at 04/05/19 1138 .  ondansetron (ZOFRAN) injection 4 mg, 4 mg, Intravenous, Q6H PRN, Garba, Mohammad L, MD  ROS:   General ROS: negative for - chills, fatigue, fever, night sweats, weight gain or weight loss Psychological ROS: negative for - behavioral disorder, hallucinations, memory difficulties, mood swings or suicidal ideation Ophthalmic ROS: Positive for - blurry vision,  loss of vision ENT ROS: negative for - epistaxis, nasal discharge, oral lesions, sore throat, tinnitus or vertigo Allergy and Immunology ROS: negative for - hives or itchy/watery eyes Hematological and Lymphatic ROS: negative for - bleeding problems, bruising or swollen lymph nodes Endocrine ROS: negative for - galactorrhea, hair pattern changes, polydipsia/polyuria or temperature intolerance Respiratory ROS: negative for - cough, hemoptysis, shortness of breath or wheezing Cardiovascular ROS: negative for - chest pain, dyspnea on exertion,  edema or irregular heartbeat Gastrointestinal ROS: negative for - abdominal pain, diarrhea, hematemesis, nausea/vomiting or stool incontinence Genito-Urinary ROS: negative for - dysuria, hematuria, incontinence or urinary frequency/urgency Musculoskeletal ROS: negative for - joint swelling or muscular weakness Neurological ROS: as noted in HPI Dermatological ROS: negative  for rash and skin lesion changes  Exam: Current vital signs: BP 119/73 (BP Location: Right Arm)   Pulse 83   Temp 98.5 F (36.9 C) (Oral)   Resp 18   Ht 6' (1.829 m)   Wt 58.2 kg Comment: scale a  SpO2 100%   BMI 17.41 kg/m  Vital signs in last 24 hours: Temp:  [97.8 F (36.6 C)-98.8 F (37.1 C)] 98.5 F (36.9 C) (03/03 1135) Pulse Rate:  [53-120] 83 (03/03 1135) Resp:  [17-28] 18 (03/03 1135) BP: (104-141)/(52-91) 119/73 (03/03 1135) SpO2:  [97 %-100 %] 100 % (03/03 1135) Weight:  [58.2 kg-60 kg] 58.2 kg (03/03 0100)   Constitutional: Appears well-developed and well-nourished.  Eyes: No scleral injection HENT: No OP obstrucion Head: Normocephalic.  Cardiovascular: Normal rate and regular rhythm.  Respiratory: Effort normal, non-labored breathing GI: Soft.  No distension. There is no tenderness.  Skin: WDI  Neuro: Mental Status: Patient is awake, alert, oriented to person, place, month, year, and situation. Speech-intact with naming, repetition, comprehension Patient is a poor historian Cranial Nerves: II: 20/100 OU and 20/200 OS III,IV, VI: EOMI without ptosis or diploplia. Pupils equal, round and sluggishly reactive to light V: Facial sensation is symmetric to temperature VII: Facial movement is symmetric.  VIII: hearing is intact to voice X: Palat elevates symmetrically XI: Shoulder shrug is symmetric. XII: tongue is midline without atrophy or fasciculations.  Motor: Tone is normal. Bulk is normal. 5/5 strength was present in all four extremities.  Sensory: Sensation is symmetric to light touch and temperature in the arms and legs.  Labs I have reviewed labs in epic and the results pertinent to this consultation are:   CBC    Component Value Date/Time   WBC 8.0 04/04/2019 1735   RBC 3.61 (L) 04/04/2019 1735   HGB 11.4 (L) 04/04/2019 1735   HCT 34.8 (L) 04/04/2019 1735   PLT 259 04/04/2019 1735   MCV 96.4 04/04/2019 1735   MCH 31.6 04/04/2019 1735    MCHC 32.8 04/04/2019 1735   RDW 13.6 04/04/2019 1735   LYMPHSABS 0.8 04/01/2019 1110   MONOABS 1.4 (H) 04/01/2019 1110   EOSABS 0.1 04/01/2019 1110   BASOSABS 0.0 04/01/2019 1110    CMP     Component Value Date/Time   NA 135 04/04/2019 1735   K 3.9 04/04/2019 1735   CL 96 (L) 04/04/2019 1735   CO2 28 04/04/2019 1735   GLUCOSE 122 (H) 04/04/2019 1735   BUN 23 04/04/2019 1735   CREATININE 0.78 04/04/2019 1735   CALCIUM 8.8 (L) 04/04/2019 1735   PROT 6.9 04/04/2019 1735   ALBUMIN 2.7 (L) 04/04/2019 1735   AST 30 04/04/2019 1735   ALT 22 04/04/2019 1735   ALKPHOS 117 04/04/2019 1735   BILITOT 0.9 04/04/2019 1735   GFRNONAA >60 04/04/2019 1735   GFRAA >60 04/04/2019 1735  ESR-73 CRP-13.1  Imaging I have reviewed the images obtained:  MRI examination of the orbits with and without contrast-abnormal perineural enhancement around the optic nerve bilaterally.  The nerve itself does not enhance and shows normal signal on T2.  This pattern could be seen with vasculitis such as giant cell arteritis, Wegener's granulomatosis, and/or sarcoidosis.  In addition,  there is abnormal dural thickening and enhancement around both cerebral hemispheres bilaterally, with areas of nodular dural based enhancement in the right frontal right parietal lobe.  Differential includes meningioma as well as metastatic disease to the dura.  Given the optic nerve perineuritis, the dural enhancement may also be related same process favoring vasculitis   There is severe whole dural enhancement, not sure if that is consistent with GCA. Given h/o cancer, would also eval for metastatic disease  Etta Quill PA-C [facility] [phone]  M-F  (9:00 am- 5:00 PM)  04/05/2019, 3:56 PM   Assessment: 86 year old with bilateral vision loss that happened a few days ago, with concern for giant cell arteritis based on otological exam and MRI imaging. The MRI imaging does show perineural vascular enhancement  pattern which can be seen with giant cell arteritis but he also has a very nonspecific dural enhancement and given the history of metastatic disease, that should be pursued further to. Also, he was started on anticoagulation for possible A. fib but cardiology has seen him and they found no evidence of A. fib and they think that that is more of multifocal atrial tachycardia and he does not need anticoagulation. He did get his dose of Eliquis this morning at 930-so he will not be able to get any procedures for at least 48 hours. My differentials for his presentation are as follows: -GCA remains in the differential but will need verification by biopsy -Needs whole brain imaging to look for any evidence of carcinomatous meningitis or mets. -Other causes of visual loss with basal meningitis that would include neurosarcoid.  Recommendations: -I would continue 3 days of IV Solu-Medrol 1 g. -Prior to committing of long-term steroids, would recommend bilateral temporal artery biopsy given that his clinical presentation is not completely congruent with GCA. -MRI brain with and without contrast tomorrow -Consider spinal tap after he is at least 48 hours from his last dose of Eliquis.  Plan on ordering glucose, protein, cell count, differential, Gram stain, cytology, flow cytometry.  I have discussed my plan with Dr. Erlinda Hong  Neurology will follow.  -- Amie Portland, MD [facility] Pager: [phone] If 7pm to 7am, please call on call as listed on AMION.

## 2019-04-05 NOTE — TOC Benefit Eligibility Note (Signed)
Transition of Care [facility]) Benefit Eligibility Note    Patient Details  Name: Matthew Hickman MRN: [id number] Date of Birth: 10/02/1925   Medication/Dose: Eliquis 5mg   Covered?: Yes     Prescription Coverage Preferred Pharmacy: Any retail Pharmacy except [facility]  Spoke with Person/Company/Phone Number:: Lisa/ [employer]/ [phone]  Co-Pay: 33.00 for a 30 day supply retail 29.00 for  90 day supply Mail Order             Orbie Pyo Phone Number: 04/05/2019, 2:02 PM

## 2019-04-05 NOTE — Plan of Care (Signed)
  Problem: Education: Goal: Knowledge of General Education information will improve Description Including pain rating scale, medication(s)/side effects and non-pharmacologic comfort measures Outcome: Progressing   

## 2019-04-05 NOTE — Consult Note (Addendum)
Cardiology Consultation:   Patient ID: [name] MRN: [id number]; DOB: September 21, 1925  Admit date: 04/04/2019 Date of Consult: 04/05/2019  Primary Care Provider: [name], MD Primary Cardiologist: No primary care provider on file.  Primary Electrophysiologist:  None    Patient Profile:   Matthew Hickman is a 86 y.o. male with a hx of hypertension, melanoma and bilateral clubfoot surgery who is being seen today for the evaluation of atrial fibrillation with RVR at the request of Dr. Erlinda Hong.  He presented with visual disturbance felt to be related to giant cell arteritis.  History of Present Illness:   Matthew Hickman is a 86 y.o. male with a hx of hypertension and skin cancer who is being seen today for the evaluation of atrial fibrillation with RVR at the request of Dr. Erlinda Hong.  The patient had gone to see his ophthalmologist for visual issues.  The ophthalmologist suspected giant cell arteritis and referred the patient to the ER for treatment.  He was noted to be in atrial fibrillation with rapid ventricular response.  His vision slightly improved.  Neurology was consulted by the ER physician and recommended high-dose steroids.  Blood pressure was stable with heart rate of 120 bpm.  Chest x-ray showed no acute findings.  He was started on a Cardizem drip for heart rate control.  He has now been transitioned to Cardizem 30 mg p.o. every 6 hours.  Heart rates are in the 90s-110s.   Upon my interview the patient is totally asymptomatic.  He denies any prior heart history or arrhythmias.  He only has hypertension which he thinks is probably related to drinking bourbon every night.  Currently he drinks about 2.5 ounces of bourbon per night, having cut down from 4 ounces per night about a month ago.  He has never smoked.` He lives with his second wife who only has 1 leg.  He has to help her out significantly.  They eat mostly frozen dinners.  He still drives and goes to the grocery store.  He does not do any  regular exercise.  Matthew Hickman denies any palpitations or fast heartbeat.  He denies any chest pain/pressure, shortness of breath, lightheadedness, syncope, orthopnea, PND or edema.   Heart Pathway Score:     Past Medical History:  Diagnosis Date  . Cancer ([place])    skin  . Hypertension     Past Surgical History:  Procedure Laterality Date  . APPENDECTOMY    . MELANOMA EXCISION Bilateral 01/30/2019   Procedure: WIDE LOCAL EXCISION WITH ADVANCEMENT FLAP CLOSURE OF MELANOMA LEFT NECK AND RIGHT BACK;  Surgeon: [name], MD;  Location: [facility];  Service: General;  Laterality: Bilateral;     Home Medications:  Prior to Admission medications   Medication Sig Start Date End Date Taking? Authorizing Provider  cephALEXin (KEFLEX) 250 MG capsule Take 1 capsule (250 mg total) by mouth 4 (four) times daily. 04/01/19  Yes Hayden Rasmussen, MD  hydrochlorothiazide 25 MG tablet Take 25 mg by mouth daily.     Yes [provider]  metoprolol tartrate (LOPRESSOR) 25 MG tablet Take 25 mg by mouth 2 (two) times daily.  03/20/16  Yes [provider]  Multiple Vitamin (MULTIVITAMIN WITH MINERALS) TABS tablet Take 1 tablet by mouth daily.   Yes [provider]  terazosin (HYTRIN) 1 MG capsule Take 1 mg by mouth at bedtime.   Yes [provider]  traMADol (ULTRAM) 50 MG tablet Take 1-2 tablets (50-100 mg total)  by mouth every 6 (six) hours as needed for moderate pain or severe pain. 01/30/19  Yes [name], MD    Inpatient Medications: Scheduled Meds: . apixaban  2.5 mg Oral BID  . diltiazem  30 mg Oral Q6H  . feeding supplement (ENSURE ENLIVE)  237 mL Oral TID BM  . multivitamin with minerals  1 tablet Oral Daily   Continuous Infusions: . diltiazem (CARDIZEM) infusion Stopped (04/05/19 0106)  . methylPREDNISolone (SOLU-MEDROL) injection     PRN Meds: acetaminophen, ondansetron (ZOFRAN) IV  Allergies:    Allergies  Allergen Reactions  . Tape Other (See  Comments)    PATIENT'S SKIN IS THIN AND IT TEARS EASILY    Social History:   Social History   Socioeconomic History  . Marital status: Married    Spouse name: Not on file  . Number of children: Not on file  . Years of education: Not on file  . Highest education level: Not on file  Occupational History  . Not on file  Tobacco Use  . Smoking status: Never Smoker  . Smokeless tobacco: Never Used  Substance and Sexual Activity  . Alcohol use: Yes    Comment: 1 glass of bourbon per night  . Drug use: Never  . Sexual activity: Not on file  Other Topics Concern  . Not on file  Social History Narrative  . Not on file   Social Determinants of Health   Financial Resource Strain:   . Difficulty of Paying Living Expenses: Not on file  Food Insecurity:   . Worried About Charity fundraiser in the Last Year: Not on file  . Ran Out of Food in the Last Year: Not on file  Transportation Needs:   . Lack of Transportation (Medical): Not on file  . Lack of Transportation (Non-Medical): Not on file  Physical Activity:   . Days of Exercise per Week: Not on file  . Minutes of Exercise per Session: Not on file  Stress:   . Feeling of Stress : Not on file  Social Connections:   . Frequency of Communication with Friends and Family: Not on file  . Frequency of Social Gatherings with Friends and Family: Not on file  . Attends Religious Services: Not on file  . Active Member of Clubs or Organizations: Not on file  . Attends Archivist Meetings: Not on file  . Marital Status: Not on file  Intimate Partner Violence:   . Fear of Current or Ex-Partner: Not on file  . Emotionally Abused: Not on file  . Physically Abused: Not on file  . Sexually Abused: Not on file    Family History:    Family History  Problem Relation Age of Onset  . Heart attack Father        Died at age 54  . Heart failure Father      ROS:  Please see the history of present illness.   All other ROS  reviewed and negative.     Physical Exam/Data:   Vitals:   04/05/19 0418 04/05/19 0742 04/05/19 0752 04/05/19 1135  BP: 122/65 (!) 122/59 (!) 104/52 119/73  Pulse: 96 88 94 83  Resp: 19 18 20 18   Temp: 97.8 F (36.6 C) 97.9 F (36.6 C) 98.1 F (36.7 C) 98.5 F (36.9 C)  TempSrc: Oral Oral Oral Oral  SpO2: 98% 100% 100% 100%  Weight:      Height:        Intake/Output Summary (Last  24 hours) at 04/05/2019 1433 Last data filed at 04/05/2019 1400 Gross per 24 hour  Intake 946.6 ml  Output 350 ml  Net 596.6 ml   Last 3 Weights 04/05/2019 04/04/2019 01/30/2019  Weight (lbs) 128 lb 6.4 oz 132 lb 4.4 oz 130 lb  Weight (kg) 58.242 kg 60 kg 58.968 kg     Body mass index is 17.41 kg/m.  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 ear Neck: no JVD Endocrine:  No thryomegaly Vascular: No carotid bruits; FA pulses 2+ bilaterally without bruits  Cardiac:  normal S1, S2; irregularly irregular rhythm; no murmur  Lungs:  clear to auscultation bilaterally, no wheezing, rhonchi or rales  Abd: soft, nontender, no hepatomegaly  Ext: no edema Musculoskeletal:  No deformities, BUE and BLE strength normal and equal Skin: warm and dry  Neuro:  CNs 2-12 intact, no focal abnormalities noted  Psych:  Normal affect   EKG:  The EKG was personally reviewed and demonstrates:  Afib, 141 bpm Multiform ventricular premature complexes Right axis deviation Telemetry:  Telemetry was personally reviewed and demonstrates: Atrial fibrillation vs atrial tachycardia and PACs as p waves are present for the majority of complexes, rates in the 90s-110s, with PVCs  Relevant CV Studies:  None  Laboratory Data:  High Sensitivity Troponin:  No results for input(s): TROPONINIHS in the last 720 hours.   Chemistry Recent Labs  Lab 04/01/19 1110 04/04/19 1735  NA 138 135  K 3.9 3.9  CL 98 96*  CO2 29 28  GLUCOSE 113* 122*  BUN  23 23  CREATININE 0.82 0.78  CALCIUM 8.9 8.8*  GFRNONAA >60 >60  GFRAA >60 >60  ANIONGAP 11 11    Recent Labs  Lab 04/04/19 1735  PROT 6.9  ALBUMIN 2.7*  AST 30  ALT 22  ALKPHOS 117  BILITOT 0.9   Hematology Recent Labs  Lab 04/01/19 1110 04/04/19 1735  WBC 7.4 8.0  RBC 3.52* 3.61*  HGB 11.1* 11.4*  HCT 34.2* 34.8*  MCV 97.2 96.4  MCH 31.5 31.6  MCHC 32.5 32.8  RDW 13.9 13.6  PLT 221 259   BNPNo results for input(s): BNP, PROBNP in the last 168 hours.  DDimer No results for input(s): DDIMER in the last 168 hours.   Radiology/Studies:  DG Chest Port 1 View  Result Date: 04/04/2019 CLINICAL DATA:  86 year old male with cough. EXAM: PORTABLE CHEST 1 VIEW COMPARISON:  Chest radiograph dated 09/17/2010. FINDINGS: Minimal bibasilar atelectasis. No focal consolidation, pleural effusion, or pneumothorax. Stable cardiac silhouette. Atherosclerotic calcification of the aorta. No acute osseous pathology. IMPRESSION: No acute cardiopulmonary process. Electronically Signed   By: Anner Crete M.D.   On: 04/04/2019 19:08   MR ORBITS W WO CONTRAST  Result Date: 04/04/2019 CLINICAL DATA:  Headache.  Basilar orbital giant cell arteritis. EXAM: MRI OF THE ORBITS WITHOUT AND WITH CONTRAST TECHNIQUE: Multiplanar, multisequence MR imaging of the orbits was performed both before and after the administration of intravenous contrast. CONTRAST:  48mL GADAVIST GADOBUTROL 1 MMOL/ML IV SOLN COMPARISON:  None. FINDINGS: Bilateral cataract extraction. Normal shape and signal in the globe without mass lesion. No orbital mass or edema. Orbital fat normal. No enhancing mass in the orbit. Optic nerve normal in size and signal. Following contrast infusion there is prominent perineural enhancement of the optic nerve bilaterally. The optic nerve does not enhance significantly. Optic chiasm normal. Cavernous sinus normal. Limited  imaging of the brain demonstrates generalized atrophy. No acute infarct on  diffusion-weighted imaging. Postcontrast imaging of the brain reveals diffuse dural thickening surrounding both cerebral hemispheres right greater than left. Nodular enhancement of the right frontal dura measuring approximately 11 mm, and nodular enhancement of the right parietal dura measuring approximately 8 x 11 mm. Normal enhancement of the optic chiasm. IMPRESSION: Abnormal perineural enhancement around the optic nerve bilaterally. The nerve itself does not enhance and shows normal signal on T2. This pattern could be seen with vasculitis such as giant cell arteritis, Wegener's granulomatosis, sarcoidosis. In addition, there is abnormal dural thickening and enhancement around both cerebral hemispheres bilaterally, with areas of nodular dural based enhancement in the right frontal right parietal lobe. Differential includes meningioma as well as metastatic disease to the dura. Given the optic nerve Perineuritis, the dural enhancement may also be related to the same process favoring vasculitis. These results were called by telephone at the time of interpretation on 04/04/2019 at 9:21 pm to provider DAVID YAO , who verbally acknowledged these results. Electronically Signed   By: Franchot Gallo M.D.   On: 04/04/2019 21:21         Assessment and Plan:   Atrial fibrillation with rapid ventricular response vs atrial tachycardia -Patient presented for evaluation of giant cell arteritis and was noted to be in possilble atrial fibrillation with RVR.  He is completely asymptomatic with regards to A. Fib. His EKG and telemetry show p waves with most of the complexes and PACs, may be atrial tachycardia as opposed to afib.  -Patient was started on Cardizem drip, now transitioned to oral diltiazem 30 mg every 6 hours. -TSH is in normal range, 1.69. -Currently heart rates are in the 90s-110s. -CHA2DS2/VAS Stroke Risk Score is at least 3 for hypertension and age.  Patient has been started on apixaban 2.5 mg twice daily,  reduced dose for weight less than 60 kg and age greater than 82.  -Dr. Burt Knack will review EKG with EP and if not felt to be afib, pt would not need anticoagulation.  -Will check echocardiogram.  -Continue diltiazem and can add his home metoprolol for better heart rate control.   Giant cell arteritis -Patient with visual changes and some headache with tenderness in the bilateral temporal areas.  Giant cell arteritis suspected by ophthalmologist. -Sed rate 73 -Neurology recommended high-dose steroids.  Essential hypertension -On hydrochlorothiazide 25 mg and metoprolol tartrate 25 mg twice daily at home.  Neither are continued currently. -Now on Cardizem 30 mg every 6 hours for heart rate control in atrial fibrillation. -Blood pressure is currently well controlled.    For questions or updates, please contact [place] Please consult [email or web address] for contact info under   Signed, Daune Perch, NP  04/05/2019 2:33 PM   Patient seen, examined. Available data reviewed. Agree with findings, assessment, and plan as outlined by Pecolia Ades, NP.  The patient is independently interviewed and examined.  He is a delightful elderly male in no distress.  JVP is normal, lung fields are clear, heart is tachycardic and irregular without murmur gallop, abdomen is soft and nontender with no organomegaly, extremities have no edema.  Skin is warm and dry without rash.  EKG is reviewed and shows sinus rhythm with frequent supraventricular ectopy, possible ectopic atrial focus.  The patient is admitted with vision loss related to acute giant cell arteritis.  He is incidentally noted to have an irregular heart rhythm.  I have reviewed all of his EKGs and  telemetry.  There is no evidence of atrial fibrillation.  Will add metoprolol to his regimen for better AV blockade since his heart rate is ranging from 110 to 130 bpm.  Will likely consolidate diltiazem tomorrow.  Will discontinue anticoagulation since there is  no atrial fibrillation identified.  Sherren Mocha, M.D. 04/05/2019 5:01 PM

## 2019-04-05 NOTE — Progress Notes (Signed)
  Echocardiogram 2D Echocardiogram has been performed.  Matthew Hickman 04/05/2019, 5:01 PM

## 2019-04-06 DIAGNOSIS — E43 Unspecified severe protein-calorie malnutrition: Secondary | ICD-10-CM | POA: Insufficient documentation

## 2019-04-06 LAB — CBC WITH DIFFERENTIAL/PLATELET
Abs Immature Granulocytes: 0.04 10*3/uL (ref 0.00–0.07)
Basophils Absolute: 0 10*3/uL (ref 0.0–0.1)
Basophils Relative: 0 %
Eosinophils Absolute: 0 10*3/uL (ref 0.0–0.5)
Eosinophils Relative: 0 %
HCT: 32.1 % — ABNORMAL LOW (ref 39.0–52.0)
Hemoglobin: 10.6 g/dL — ABNORMAL LOW (ref 13.0–17.0)
Immature Granulocytes: 1 %
Lymphocytes Relative: 15 %
Lymphs Abs: 0.7 10*3/uL (ref 0.7–4.0)
MCH: 31.3 pg (ref 26.0–34.0)
MCHC: 33 g/dL (ref 30.0–36.0)
MCV: 94.7 fL (ref 80.0–100.0)
Monocytes Absolute: 0.1 10*3/uL (ref 0.1–1.0)
Monocytes Relative: 3 %
Neutro Abs: 3.9 10*3/uL (ref 1.7–7.7)
Neutrophils Relative %: 81 %
Platelets: 256 10*3/uL (ref 150–400)
RBC: 3.39 MIL/uL — ABNORMAL LOW (ref 4.22–5.81)
RDW: 13.4 % (ref 11.5–15.5)
WBC: 4.8 10*3/uL (ref 4.0–10.5)
nRBC: 0 % (ref 0.0–0.2)

## 2019-04-06 LAB — BASIC METABOLIC PANEL
Anion gap: 13 (ref 5–15)
BUN: 33 mg/dL — ABNORMAL HIGH (ref 8–23)
CO2: 29 mmol/L (ref 22–32)
Calcium: 9.3 mg/dL (ref 8.9–10.3)
Chloride: 96 mmol/L — ABNORMAL LOW (ref 98–111)
Creatinine, Ser: 1.04 mg/dL (ref 0.61–1.24)
GFR calc Af Amer: >60 mL/min (ref >60)
GFR calc non Af Amer: >60 mL/min (ref >60)
Glucose, Bld: 174 mg/dL — ABNORMAL HIGH (ref 70–99)
Potassium: 4.2 mmol/L (ref 3.5–5.1)
Sodium: 138 mmol/L (ref 135–145)

## 2019-04-06 LAB — MAGNESIUM: Magnesium: 1.9 mg/dL (ref 1.7–2.4)

## 2019-04-06 LAB — SEDIMENTATION RATE: Sed Rate: 55 mm/hr — ABNORMAL HIGH (ref 0–16)

## 2019-04-06 LAB — C-REACTIVE PROTEIN: CRP: 7.6 mg/dL — ABNORMAL HIGH (ref <1.0)

## 2019-04-06 MED ORDER — DILTIAZEM HCL ER COATED BEADS 180 MG PO CP24
180.0000 mg | ORAL_CAPSULE | Freq: Every day | ORAL | Status: DC
  Administered 2019-04-07 – 2019-04-10 (×4): 180 mg via ORAL
  Filled 2019-04-06 (×4): qty 1

## 2019-04-06 MED ORDER — SENNOSIDES-DOCUSATE SODIUM 8.6-50 MG PO TABS
1.0000 | ORAL_TABLET | Freq: Two times a day (BID) | ORAL | Status: DC
  Administered 2019-04-06 – 2019-04-10 (×8): 1 via ORAL
  Filled 2019-04-06 (×8): qty 1

## 2019-04-06 MED ORDER — CHLORHEXIDINE GLUCONATE CLOTH 2 % EX PADS
6.0000 | MEDICATED_PAD | Freq: Once | CUTANEOUS | Status: AC
  Administered 2019-04-07: 6 via TOPICAL

## 2019-04-06 NOTE — Consult Note (Signed)
[facility] Surgery Consult Note  Matthew Hickman 12/24/1925  [id number].    Requesting MD: Florencia Reasons Chief Complaint/Reason for Consult: temporal artery biopsy  HPI:  Matthew Hickman is a 86yo male PMH HTN who was admitted to [facility] 3/2 with sudden onset of bilateral visual loss. He states that this occurred over the course of a few days. Denies headahce, nausea, vomiting. 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 Neurology was consulted and started the patient on IV sol-medrolol. Recommend general surgery consult for bilateral temporal artery biopsy given that his clinical presentation is not completely congruent with GCA.  Of note, upon admission the patient was thought to be in atrial fibrillation. He was started on eliquis (last dose 3/3 in the AM). Cardiology consulted and suspects this was frequent supraventricular ectopy, possible ectopic atrial focus, no atrial fibrillation. He was started on metoprolol and eliquis was stopped.   Nonsmoker Lives at home with his wife Does not use a cane or walker for ambulation  Review of Systems  Constitutional: Negative.   HENT: Positive for hearing loss.   Eyes: Positive for blurred vision.  Respiratory: Negative.   Cardiovascular: Negative.   Gastrointestinal: Negative.   Genitourinary: Negative.   Musculoskeletal: Positive for joint pain.   All systems reviewed and otherwise negative except for as above  Family History  Problem Relation Age of Onset  . Heart attack Father        Died at age 45  . Heart failure Father     Past Medical History:  Diagnosis Date  . Cancer ([place])    skin  . Hypertension     Past Surgical History:  Procedure Laterality Date  . APPENDECTOMY    . MELANOMA EXCISION Bilateral 01/30/2019   Procedure: WIDE LOCAL EXCISION WITH ADVANCEMENT FLAP CLOSURE OF MELANOMA LEFT NECK AND RIGHT BACK;  Surgeon:  [name], MD;  Location: [place];  Service: General;  Laterality: Bilateral;    Social History:  reports that he has never smoked. He has never used smokeless tobacco. He reports current alcohol use. He reports that he does not use drugs.  Allergies:  Allergies  Allergen Reactions  . Tape Other (See Comments)    PATIENT'S SKIN IS THIN AND IT TEARS EASILY    Medications Prior to Admission  Medication Sig Dispense Refill  . cephALEXin (KEFLEX) 250 MG capsule Take 1 capsule (250 mg total) by mouth 4 (four) times daily. 28 capsule 0  . hydrochlorothiazide 25 MG tablet Take 25 mg by mouth daily.      . metoprolol tartrate (LOPRESSOR) 25 MG tablet Take 25 mg by mouth 2 (two) times daily.     . Multiple Vitamin (MULTIVITAMIN WITH MINERALS) TABS tablet Take 1 tablet by mouth daily.    Marland Kitchen terazosin (HYTRIN) 1 MG capsule Take 1 mg by mouth at bedtime.    . traMADol (ULTRAM) 50 MG tablet Take 1-2 tablets (50-100 mg total) by mouth every 6 (six) hours as needed for moderate pain or severe pain. 30 tablet 0    Prior to Admission medications   Medication Sig Start Date End Date Taking? Authorizing Provider  cephALEXin (KEFLEX) 250 MG capsule Take 1 capsule (250 mg total) by mouth 4 (four) times daily. 04/01/19  Yes Hayden Rasmussen, MD  hydrochlorothiazide 25 MG tablet Take 25 mg by mouth daily.     Yes [provider]  metoprolol tartrate (LOPRESSOR) 25 MG tablet Take 25 mg by mouth 2 (two) times daily.  03/20/16  Yes [provider]  Multiple Vitamin (MULTIVITAMIN WITH MINERALS) TABS tablet Take 1 tablet by mouth daily.   Yes [provider]  terazosin (HYTRIN) 1 MG capsule Take 1 mg by mouth at bedtime.   Yes [provider]  traMADol (ULTRAM) 50 MG tablet Take 1-2 tablets (50-100 mg total) by mouth every 6 (six) hours as needed for moderate pain or severe pain. 01/30/19  Yes [name], MD    Blood pressure (!) 124/57, pulse 83, temperature 97.8 F (36.6  C), temperature source Oral, resp. rate 17, height 6' (1.829 m), weight 58.2 kg, SpO2 99 %. Physical Exam: General: pleasant, frail white male who is laying in bed in NAD HEENT: head is normocephalic, atraumatic.  Sclera are noninjected.  PERRL.  Ears and nose without any masses or lesions.  Mouth is pink and moist. Dentition poor Heart: regular, rate, and rhythm.  Normal s1,s2. No obvious murmurs, gallops, or rubs noted.  Palpable pedal pulses bilaterally  Lungs: CTAB, no wheezes, rhonchi, or rales noted.  Respiratory effort nonlabored Abd: soft, NT/ND, +BS, no masses, hernias, or organomegaly MS: no BUE/BLE edema, calves soft and nontender Skin: warm and dry with no masses, lesions, or rashes Psych: A&Ox4 with an appropriate affect Neuro: cranial nerves grossly intact, equal strength in BUE/BLE bilaterally, normal speech, thought process intact  Results for orders placed or performed during the hospital encounter of 04/04/19 (from the past 48 hour(s))  Sedimentation rate     Status: Abnormal   Collection Time: 04/04/19  5:35 PM  Result Value Ref Range   Sed Rate 73 (H) 0 - 16 mm/hr    Comment: Performed at [facility], [address], [place], [place] [address]  C-reactive protein     Status: Abnormal   Collection Time: 04/04/19  5:35 PM  Result Value Ref Range   CRP 13.1 (H) <1.0 mg/dL    Comment: Performed at [facility] [address] [place], [place] [address]  CBC     Status: Abnormal   Collection Time: 04/04/19  5:35 PM  Result Value Ref Range   WBC 8.0 4.0 - 10.5 K/uL   RBC 3.61 (L) 4.22 - 5.81 MIL/uL   Hemoglobin 11.4 (L) 13.0 - 17.0 g/dL   HCT 34.8 (L) 39.0 - 52.0 %   MCV 96.4 80.0 - 100.0 fL   MCH 31.6 26.0 - 34.0 pg   MCHC 32.8 30.0 - 36.0 g/dL   RDW 13.6 11.5 - 15.5 %   Platelets 259 150 - 400 K/uL   nRBC 0.0 0.0 - 0.2 %    Comment: Performed at [facility], [place] [address] [place], [place] [address]  Comprehensive metabolic panel     Status:  Abnormal   Collection Time: 04/04/19  5:35 PM  Result Value Ref Range   Sodium 135 135 - 145 mmol/L   Potassium 3.9 3.5 - 5.1 mmol/L   Chloride 96 (L) 98 - 111 mmol/L   CO2 28 22 - 32 mmol/L   Glucose, Bld 122 (H) 70 - 99 mg/dL    Comment: Glucose reference range applies only to samples taken after fasting for at least 8 hours.   BUN 23 8 - 23 mg/dL   Creatinine, Ser 0.78 0.61 - 1.24 mg/dL   Calcium 8.8 (L) 8.9 - 10.3 mg/dL   Total Protein 6.9 6.5 -  8.1 g/dL   Albumin 2.7 (L) 3.5 - 5.0 g/dL   AST 30 15 - 41 U/L   ALT 22 0 - 44 U/L   Alkaline Phosphatase 117 38 - 126 U/L   Total Bilirubin 0.9 0.3 - 1.2 mg/dL   GFR calc non Af Amer >60 >60 mL/min   GFR calc Af Amer >60 >60 mL/min   Anion gap 11 5 - 15    Comment: Performed at [facility] [address], [place], [place] [address]  SARS CORONAVIRUS 2 (TAT 6-24 HRS) Nasopharyngeal Nasopharyngeal Swab     Status: None   Collection Time: 04/04/19  7:23 PM   Specimen: Nasopharyngeal Swab  Result Value Ref Range   SARS Coronavirus 2 NEGATIVE NEGATIVE    Comment: (NOTE) SARS-CoV-2 target nucleic acids are NOT DETECTED. The SARS-CoV-2 RNA is generally detectable in upper and lower respiratory specimens during the acute phase of infection. Negative results do not preclude SARS-CoV-2 infection, do not rule out co-infections with other pathogens, and should not be used as the sole basis for treatment or other patient management decisions. Negative results must be combined with clinical observations, patient history, and epidemiological information. The expected result is Negative. Fact Sheet for Patients: SugarRoll.be Fact Sheet for Healthcare Providers: https://www.woods-mathews.com/ This test is not yet approved or cleared by the [place] FDA and  has been authorized for detection and/or diagnosis of SARS-CoV-2 by FDA under an Emergency Use Authorization (EUA). This EUA will remain   in effect (meaning this test can be used) for the duration of the COVID-19 declaration under Section 56 4(b)(1) of the Act, 21 U.S.C. section 360bbb-3(b)(1), unless the authorization is terminated or revoked sooner. Performed at [facility], [place] [address], [place], [place] [address]   Urinalysis, Routine w reflex microscopic     Status: Abnormal   Collection Time: 04/04/19  7:49 PM  Result Value Ref Range   Color, Urine YELLOW YELLOW   APPearance CLEAR CLEAR   Specific Gravity, Urine 1.015 1.005 - 1.030   pH 6.0 5.0 - 8.0   Glucose, UA NEGATIVE NEGATIVE mg/dL   Hgb urine dipstick LARGE (A) NEGATIVE   Bilirubin Urine NEGATIVE NEGATIVE   Ketones, ur NEGATIVE NEGATIVE mg/dL   Protein, ur NEGATIVE NEGATIVE mg/dL   Nitrite NEGATIVE NEGATIVE   Leukocytes,Ua TRACE (A) NEGATIVE   RBC / HPF 21-50 0 - 5 RBC/hpf   WBC, UA 0-5 0 - 5 WBC/hpf   Bacteria, UA NONE SEEN NONE SEEN   Squamous Epithelial / LPF 0-5 0 - 5   Mucus PRESENT     Comment: Performed at [facility], [place] [address] [place], [place] [address]  TSH     Status: None   Collection Time: 04/05/19  5:50 AM  Result Value Ref Range   TSH 1.629 0.350 - 4.500 uIU/mL    Comment: Performed by a 3rd Generation assay with a functional sensitivity of <=0.01 uIU/mL. Performed at [facility], [place] [address], [place], [place] [address]   CBC with Differential/Platelet     Status: Abnormal   Collection Time: 04/06/19  2:31 AM  Result Value Ref Range   WBC 4.8 4.0 - 10.5 K/uL   RBC 3.39 (L) 4.22 - 5.81 MIL/uL   Hemoglobin 10.6 (L) 13.0 - 17.0 g/dL   HCT 32.1 (L) 39.0 - 52.0 %   MCV 94.7 80.0 - 100.0 fL   MCH 31.3 26.0 - 34.0 pg   MCHC  33.0 30.0 - 36.0 g/dL   RDW 13.4 11.5 - 15.5 %   Platelets 256 150 - 400 K/uL   nRBC 0.0 0.0 - 0.2 %   Neutrophils Relative % 81 %   Neutro Abs 3.9 1.7 - 7.7 K/uL   Lymphocytes Relative 15 %   Lymphs Abs 0.7 0.7 - 4.0 K/uL   Monocytes Relative 3 %   Monocytes Absolute 0.1 0.1  - 1.0 K/uL   Eosinophils Relative 0 %   Eosinophils Absolute 0.0 0.0 - 0.5 K/uL   Basophils Relative 0 %   Basophils Absolute 0.0 0.0 - 0.1 K/uL   Immature Granulocytes 1 %   Abs Immature Granulocytes 0.04 0.00 - 0.07 K/uL    Comment: Performed at [facility] [address] [place], [place] [id number]  Basic metabolic panel     Status: Abnormal   Collection Time: 04/06/19  2:31 AM  Result Value Ref Range   Sodium 138 135 - 145 mmol/L   Potassium 4.2 3.5 - 5.1 mmol/L   Chloride 96 (L) 98 - 111 mmol/L   CO2 29 22 - 32 mmol/L   Glucose, Bld 174 (H) 70 - 99 mg/dL    Comment: Glucose reference range applies only to samples taken after fasting for at least 8 hours.   BUN 33 (H) 8 - 23 mg/dL   Creatinine, Ser 1.04 0.61 - 1.24 mg/dL   Calcium 9.3 8.9 - 10.3 mg/dL   GFR calc non Af Amer >60 >60 mL/min   GFR calc Af Amer >60 >60 mL/min   Anion gap 13 5 - 15    Comment: Performed at [facility] [address] [place], [place] [address]  Magnesium     Status: None   Collection Time: 04/06/19  2:31 AM  Result Value Ref Range   Magnesium 1.9 1.7 - 2.4 mg/dL    Comment: Performed at [facility], [place] [address], [place], [place] [address]  Sedimentation rate     Status: Abnormal   Collection Time: 04/06/19  2:31 AM  Result Value Ref Range   Sed Rate 55 (H) 0 - 16 mm/hr    Comment: Performed at [facility] [address], [place], [place] [address]  C-reactive protein     Status: Abnormal   Collection Time: 04/06/19  2:31 AM  Result Value Ref Range   CRP 7.6 (H) <1.0 mg/dL    Comment: Performed at [facility] [address] [place], [place] [address]   DG Chest Port 1 View  Result Date: 04/04/2019 CLINICAL DATA:  86 year old male with cough. EXAM: PORTABLE CHEST 1 VIEW COMPARISON:  Chest radiograph dated 09/17/2010. FINDINGS: Minimal bibasilar atelectasis. No focal consolidation, pleural effusion, or pneumothorax. Stable cardiac silhouette.  Atherosclerotic calcification of the aorta. No acute osseous pathology. IMPRESSION: No acute cardiopulmonary process. Electronically Signed   By: Anner Crete M.D.   On: 04/04/2019 19:08   ECHOCARDIOGRAM COMPLETE  Result Date: 04/05/2019    ECHOCARDIOGRAM REPORT   Patient Name:   Matthew Hickman Date of Exam: 04/05/2019 Medical Rec #:  [id number]      Height:       72.0 in Accession #:    [id number]     Weight:       128.4 lb Date of Birth:  Oct 16, 1925      BSA:          1.765 m Patient Age:    59 years  BP:           119/73 mmHg Patient Gender: M              HR:           83 bpm. Exam Location:  Inpatient Procedure: 2D Echo, Cardiac Doppler and Color Doppler Indications:    Atrial fibrillation 427.31/I48.91  History:        Patient has no prior history of Echocardiogram examinations.                 Risk Factors:Hypertension.  Sonographer:    Clayton Lefort RDCS (AE) Referring Phys: [id number] [place]  1. Left ventricular ejection fraction, by estimation, is 55 to 60%. The left ventricle has normal function. The left ventricle has no regional wall motion abnormalities. Left ventricular diastolic parameters are consistent with Grade I diastolic dysfunction (impaired relaxation).  2. Right ventricular systolic function is normal. The right ventricular size is normal. There is moderately elevated pulmonary artery systolic pressure. The estimated right ventricular systolic pressure is [id number] mmHg.  3. The mitral valve is normal in structure and function. Trivial mitral valve regurgitation. No evidence of mitral stenosis.  4. The aortic valve is tricuspid. Aortic valve regurgitation is not visualized. No aortic stenosis is present.  5. Aortic dilatation noted. There is mild dilatation of the ascending aorta measuring 40 mm.  6. The inferior vena cava is dilated in size with <50% respiratory variability, suggesting right atrial pressure of 15 mmHg. FINDINGS  Left Ventricle: Left ventricular ejection  fraction, by estimation, is 55 to 60%. The left ventricle has normal function. The left ventricle has no regional wall motion abnormalities. The left ventricular internal cavity size was normal in size. There is  no left ventricular hypertrophy. Left ventricular diastolic parameters are consistent with Grade I diastolic dysfunction (impaired relaxation). Right Ventricle: The right ventricular size is normal. No increase in right ventricular wall thickness. Right ventricular systolic function is normal. There is moderately elevated pulmonary artery systolic pressure. The tricuspid regurgitant velocity is 2.61 m/s, and with an assumed right atrial pressure of 15 mmHg, the estimated right ventricular systolic pressure is [id number] mmHg. Left Atrium: Left atrial size was normal in size. Right Atrium: Right atrial size was normal in size. Pericardium: There is no evidence of pericardial effusion. Mitral Valve: The mitral valve is normal in structure and function. Trivial mitral valve regurgitation. No evidence of mitral valve stenosis. Tricuspid Valve: The tricuspid valve is normal in structure. Tricuspid valve regurgitation is trivial. Aortic Valve: The aortic valve is tricuspid. Aortic valve regurgitation is not visualized. No aortic stenosis is present. Pulmonic Valve: The pulmonic valve was normal in structure. Pulmonic valve regurgitation is not visualized. Aorta: The aortic root is normal in size and structure and aortic dilatation noted. There is mild dilatation of the ascending aorta measuring 40 mm. Venous: The inferior vena cava is dilated in size with less than 50% respiratory variability, suggesting right atrial pressure of 15 mmHg. IAS/Shunts: No atrial level shunt detected by color flow Doppler.  LEFT VENTRICLE PLAX 2D LVIDd:         4.66 cm LVIDs:         3.76 cm LV PW:         0.99 cm LV IVS:        1.06 cm LVOT diam:     2.00 cm LV SV:         52 LV SV Index:   29 LVOT Area:  3.14 cm  RIGHT VENTRICLE              IVC RV Basal diam:  3.55 cm     IVC diam: 2.41 cm RV Mid diam:    2.48 cm RV S prime:     11.00 cm/s TAPSE (M-mode): 1.6 cm LEFT ATRIUM             Index       RIGHT ATRIUM           Index LA diam:        2.80 cm 1.59 cm/m  RA Area:     12.30 cm LA Vol (A2C):   35.4 ml 20.06 ml/m RA Volume:   26.40 ml  14.96 ml/m LA Vol (A4C):   37.2 ml 21.08 ml/m LA Biplane Vol: 38.7 ml 21.93 ml/m  AORTIC VALVE LVOT Vmax:   84.70 cm/s LVOT Vmean:  59.860 cm/s LVOT VTI:    0.164 m  AORTA Ao Root diam: 3.40 cm Ao Asc diam:  4.00 cm TRICUSPID VALVE TR Peak grad:   27.2 mmHg TR Vmax:        261.00 cm/s  SHUNTS Systemic VTI:  0.16 m Systemic Diam: 2.00 cm Loralie Champagne MD Electronically signed by Loralie Champagne MD Signature Date/Time: 04/05/2019/9:44:03 PM    Final    MR ORBITS W WO CONTRAST  Result Date: 04/04/2019 CLINICAL DATA:  Headache.  Basilar orbital giant cell arteritis. EXAM: MRI OF THE ORBITS WITHOUT AND WITH CONTRAST TECHNIQUE: Multiplanar, multisequence MR imaging of the orbits was performed both before and after the administration of intravenous contrast. CONTRAST:  21mL GADAVIST GADOBUTROL 1 MMOL/ML IV SOLN COMPARISON:  None. FINDINGS: Bilateral cataract extraction. Normal shape and signal in the globe without mass lesion. No orbital mass or edema. Orbital fat normal. No enhancing mass in the orbit. Optic nerve normal in size and signal. Following contrast infusion there is prominent perineural enhancement of the optic nerve bilaterally. The optic nerve does not enhance significantly. Optic chiasm normal. Cavernous sinus normal. Limited imaging of the brain demonstrates generalized atrophy. No acute infarct on diffusion-weighted imaging. Postcontrast imaging of the brain reveals diffuse dural thickening surrounding both cerebral hemispheres right greater than left. Nodular enhancement of the right frontal dura measuring approximately 11 mm, and nodular enhancement of the right parietal dura measuring  approximately 8 x 11 mm. Normal enhancement of the optic chiasm. IMPRESSION: Abnormal perineural enhancement around the optic nerve bilaterally. The nerve itself does not enhance and shows normal signal on T2. This pattern could be seen with vasculitis such as giant cell arteritis, Wegener's granulomatosis, sarcoidosis. In addition, there is abnormal dural thickening and enhancement around both cerebral hemispheres bilaterally, with areas of nodular dural based enhancement in the right frontal right parietal lobe. Differential includes meningioma as well as metastatic disease to the dura. Given the optic nerve Perineuritis, the dural enhancement may also be related to the same process favoring vasculitis. These results were called by telephone at the time of interpretation on 04/04/2019 at 9:21 pm to provider DAVID YAO , who verbally acknowledged these results. Electronically Signed   By: Franchot Gallo M.D.   On: 04/04/2019 21:21    Anti-infectives (From admission, onward)   None       Assessment/Plan HTN  Blurry vision Possible giant cell arteritis - General surgery consulted bilateral temporal artery biopsy given that his clinical presentation is not completely congruent with GCA. Discussed the procedure with the patient and he wishes to proceed.  Will make him NPO after midnight and plan for surgery tomorrow.  ID - none VTE - SCDs FEN - D3 diet, NPO after MN Foley - none Follow up - TBD  Wellington Hampshire, PA-C [facility] 04/06/2019, 1:50 PM Please see Amion for pager number during day hours 7:00am-4:30pm

## 2019-04-06 NOTE — Progress Notes (Signed)
PROGRESS NOTE  Matthew Hickman YPP:[id number] DOB: [birth year] DOA: 04/04/2019 PCP: [name], MD  HPI/Recap of past 24 hours:  Reports has been having headache since   Melanoma surgery in 01/2019, headache has resolved since in the hospital , vision remains blurry, left eye worse than right eye.  Assessment/Plan: Principal Problem:   Rapid atrial fibrillation (HCC) Active Problems:   Giant cell arteritis (HCC)   Benign essential HTN   Giant cell arteritis/optic neuritis Esr 73/crp 13 on presentation He is started on IV Solu-Medrol 1g daily, ESR CRP trending down, headache resolved, vision remains blurry Case discussed with patient ophthalmology Dr Satira Sark who recommended neurology consult Neurology formally consulted, MRI brain pending General surgery consulted for bilateral temporal artery biopsy  frequent PACs Cardiology consulted who does not think patient has A. Fib, Eliquis discontinued, recommend Cardizem and beta-blocker He denies chest pain, no dizziness, no short of breath  cardiology input appreciated  Impaired fasting blood glucose Likely due to steroid will check A1c  Normocytic anemia hgb10.6 No sign of bleeding Check FOBT  Hypertension: Stable on current medication  FTT: Underweight, Severe malnutrition in context of chronic illness Body mass index is 17.41 kg/m. Nutrition Status: Nutrition Problem: Severe Malnutrition Etiology: chronic illness(melanoma) Signs/Symptoms: energy intake < or equal to 75% for > or equal to 1 month, severe fat depletion, severe muscle depletion Interventions: Ensure Enlive (each supplement provides 350kcal and 20 grams of protein), MVI, Magic cup Nutrition input appreciated    DVT Prophylaxis: scd  Code Status: full  Family Communication: daughter [name] over the phone at [phone]  Disposition Plan:    Patient came from:          home                                                                                                 Anticipated d/c place:  home with home health , likely early next week  Barriers to d/c OR conditions which need to be met to effect a safe d/c:  Need to bilateral temporal artery biopsy, needs neurology clearancet, needs symptom improvement, not ready to discharge   Consultants:  Cardiology  Ophthalmology Dr Satira Sark over the phone  Neurology Dr Aroor  General surgery  Procedures:  Bilateral temporal artery biopsy planned tomorrow on March 5  Antibiotics:  None   Objective: BP (!) 111/47 (BP Location: Right Arm)   Pulse 73   Temp 97.8 F (36.6 C) (Oral)   Resp 17   Ht 6' (1.829 m)   Wt 58.2 kg   SpO2 99%   BMI 17.41 kg/m   Intake/Output Summary (Last 24 hours) at 04/06/2019 1158 Last data filed at 04/06/2019 1113 Gross per 24 hour  Intake 837 ml  Output 740 ml  Net 97 ml   Filed Weights   04/04/19 1727 04/05/19 0100 04/06/19 0414  Weight: 60 kg 58.2 kg 58.2 kg    Exam: Patient is examined daily including today on 04/06/2019, exams remain the same as of yesterday except that has changed    General:  NAD, hard of  hearing, impaired vision (left worse than the right)  Cardiovascular: RRR  Respiratory: CTABL  Abdomen: Soft/ND/NT, positive BS  Musculoskeletal: trace bilateral ankle pitting edema, chronic venous stasis changes  Neuro: alert, oriented x3  Data Reviewed: Basic Metabolic Panel: Recent Labs  Lab 04/01/19 1110 04/04/19 1735 04/06/19 0231  NA 138 135 138  K 3.9 3.9 4.2  CL 98 96* 96*  CO2 29 28 29   GLUCOSE 113* 122* 174*  BUN 23 23 33*  CREATININE 0.82 0.78 1.04  CALCIUM 8.9 8.8* 9.3  MG  --   --  1.9   Liver Function Tests: Recent Labs  Lab 04/04/19 1735  AST 30  ALT 22  ALKPHOS 117  BILITOT 0.9  PROT 6.9  ALBUMIN 2.7*   No results for input(s): LIPASE, AMYLASE in the last 168 hours. No results for input(s): AMMONIA in the last 168 hours. CBC: Recent Labs  Lab 04/01/19 1110 04/04/19 1735 04/06/19 0231   WBC 7.4 8.0 4.8  NEUTROABS 5.0  --  3.9  HGB 11.1* 11.4* 10.6*  HCT 34.2* 34.8* 32.1*  MCV 97.2 96.4 94.7  PLT 221 259 256   Cardiac Enzymes:   No results for input(s): CKTOTAL, CKMB, CKMBINDEX, TROPONINI in the last 168 hours. BNP (last 3 results) No results for input(s): BNP in the last 8760 hours.  ProBNP (last 3 results) No results for input(s): PROBNP in the last 8760 hours.  CBG: No results for input(s): GLUCAP in the last 168 hours.  Recent Results (from the past 240 hour(s))  Urine culture     Status: None   Collection Time: 04/01/19  1:30 PM   Specimen: Urine, Random  Result Value Ref Range Status   Specimen Description URINE, RANDOM  Final   Special Requests NONE  Final   Culture   Final    NO GROWTH Performed at [facility], [address] [place], [place] [address]    Report Status 04/02/2019 FINAL  Final  SARS CORONAVIRUS 2 (TAT 6-24 HRS) Nasopharyngeal Nasopharyngeal Swab     Status: None   Collection Time: 04/04/19  7:23 PM   Specimen: Nasopharyngeal Swab  Result Value Ref Range Status   SARS Coronavirus 2 NEGATIVE NEGATIVE Final    Comment: (NOTE) SARS-CoV-2 target nucleic acids are NOT DETECTED. The SARS-CoV-2 RNA is generally detectable in upper and lower respiratory specimens during the acute phase of infection. Negative results do not preclude SARS-CoV-2 infection, do not rule out co-infections with other pathogens, and should not be used as the sole basis for treatment or other patient management decisions. Negative results must be combined with clinical observations, patient history, and epidemiological information. The expected result is Negative. Fact Sheet for Patients: SugarRoll.be Fact Sheet for Healthcare Providers: https://www.woods-mathews.com/ This test is not yet approved or cleared by the [place] FDA and  has been authorized for detection and/or diagnosis of SARS-CoV-2 by FDA  under an Emergency Use Authorization (EUA). This EUA will remain  in effect (meaning this test can be used) for the duration of the COVID-19 declaration under Section 56 4(b)(1) of the Act, 21 U.S.C. section 360bbb-3(b)(1), unless the authorization is terminated or revoked sooner. Performed at [facility], [place] [address] [place], [place] [address]      Studies: ECHOCARDIOGRAM COMPLETE  Result Date: 04/05/2019    ECHOCARDIOGRAM REPORT   Patient Name:   LAMONDRE WESCHE Date of Exam: 04/05/2019 Medical Rec #:  [id number]      Height:  72.0 in Accession #:    [id number]     Weight:       128.4 lb Date of Birth:  21-Sep-1925      BSA:          1.765 m Patient Age:    38 years       BP:           119/73 mmHg Patient Gender: M              HR:           83 bpm. Exam Location:  Inpatient Procedure: 2D Echo, Cardiac Doppler and Color Doppler Indications:    Atrial fibrillation 427.31/I48.91  History:        Patient has no prior history of Echocardiogram examinations.                 Risk Factors:Hypertension.  Sonographer:    Clayton Lefort RDCS (AE) Referring Phys: [id number] [place]  1. Left ventricular ejection fraction, by estimation, is 55 to 60%. The left ventricle has normal function. The left ventricle has no regional wall motion abnormalities. Left ventricular diastolic parameters are consistent with Grade I diastolic dysfunction (impaired relaxation).  2. Right ventricular systolic function is normal. The right ventricular size is normal. There is moderately elevated pulmonary artery systolic pressure. The estimated right ventricular systolic pressure is 88.8 mmHg.  3. The mitral valve is normal in structure and function. Trivial mitral valve regurgitation. No evidence of mitral stenosis.  4. The aortic valve is tricuspid. Aortic valve regurgitation is not visualized. No aortic stenosis is present.  5. Aortic dilatation noted. There is mild dilatation of the ascending aorta measuring  40 mm.  6. The inferior vena cava is dilated in size with <50% respiratory variability, suggesting right atrial pressure of 15 mmHg. FINDINGS  Left Ventricle: Left ventricular ejection fraction, by estimation, is 55 to 60%. The left ventricle has normal function. The left ventricle has no regional wall motion abnormalities. The left ventricular internal cavity size was normal in size. There is  no left ventricular hypertrophy. Left ventricular diastolic parameters are consistent with Grade I diastolic dysfunction (impaired relaxation). Right Ventricle: The right ventricular size is normal. No increase in right ventricular wall thickness. Right ventricular systolic function is normal. There is moderately elevated pulmonary artery systolic pressure. The tricuspid regurgitant velocity is 2.61 m/s, and with an assumed right atrial pressure of 15 mmHg, the estimated right ventricular systolic pressure is 28.0 mmHg. Left Atrium: Left atrial size was normal in size. Right Atrium: Right atrial size was normal in size. Pericardium: There is no evidence of pericardial effusion. Mitral Valve: The mitral valve is normal in structure and function. Trivial mitral valve regurgitation. No evidence of mitral valve stenosis. Tricuspid Valve: The tricuspid valve is normal in structure. Tricuspid valve regurgitation is trivial. Aortic Valve: The aortic valve is tricuspid. Aortic valve regurgitation is not visualized. No aortic stenosis is present. Pulmonic Valve: The pulmonic valve was normal in structure. Pulmonic valve regurgitation is not visualized. Aorta: The aortic root is normal in size and structure and aortic dilatation noted. There is mild dilatation of the ascending aorta measuring 40 mm. Venous: The inferior vena cava is dilated in size with less than 50% respiratory variability, suggesting right atrial pressure of 15 mmHg. IAS/Shunts: No atrial level shunt detected by color flow Doppler.  LEFT VENTRICLE PLAX 2D LVIDd:          4.66 cm LVIDs:  3.76 cm LV PW:         0.99 cm LV IVS:        1.06 cm LVOT diam:     2.00 cm LV SV:         52 LV SV Index:   29 LVOT Area:     3.14 cm  RIGHT VENTRICLE             IVC RV Basal diam:  3.55 cm     IVC diam: 2.41 cm RV Mid diam:    2.48 cm RV S prime:     11.00 cm/s TAPSE (M-mode): 1.6 cm LEFT ATRIUM             Index       RIGHT ATRIUM           Index LA diam:        2.80 cm 1.59 cm/m  RA Area:     12.30 cm LA Vol (A2C):   35.4 ml 20.06 ml/m RA Volume:   26.40 ml  14.96 ml/m LA Vol (A4C):   37.2 ml 21.08 ml/m LA Biplane Vol: 38.7 ml 21.93 ml/m  AORTIC VALVE LVOT Vmax:   84.70 cm/s LVOT Vmean:  59.860 cm/s LVOT VTI:    0.164 m  AORTA Ao Root diam: 3.40 cm Ao Asc diam:  4.00 cm TRICUSPID VALVE TR Peak grad:   27.2 mmHg TR Vmax:        261.00 cm/s  SHUNTS Systemic VTI:  0.16 m Systemic Diam: 2.00 cm Loralie Champagne MD Electronically signed by Loralie Champagne MD Signature Date/Time: 04/05/2019/9:44:03 PM    Final     Scheduled Meds: . Derrill Memo ON 04/07/2019] diltiazem  180 mg Oral Daily  . diltiazem  30 mg Oral Q6H  . feeding supplement (ENSURE ENLIVE)  237 mL Oral TID BM  . metoprolol tartrate  25 mg Oral BID  . multivitamin with minerals  1 tablet Oral Daily    Continuous Infusions: . sodium chloride 250 mL (04/05/19 1617)  . diltiazem (CARDIZEM) infusion Stopped (04/05/19 0106)  . methylPREDNISolone (SOLU-MEDROL) injection 1,000 mg (04/05/19 1618)     Time spent: 81mns I have personally reviewed and interpreted on  04/06/2019 daily labs, tele strips, imagings as discussed above under date review session and assessment and plans.  I reviewed all nursing notes, pharmacy notes, consultant notes,  vitals, pertinent old records  I have discussed plan of care as described above with RN , patient and family on 04/06/2019   FFlorencia ReasonsMD, PhD, FACP  [facility]  Available via Epic secure chat 7am-7pm for nonurgent issues Please page for urgent issues, pager number  available through [email or web address] .   04/06/2019, 11:58 AM  LOS: 2 days

## 2019-04-06 NOTE — TOC Progression Note (Addendum)
Transition of Care [facility]) - Progression Note    Patient Details  Name: NATRON NOVELLO MRN: [id number] Date of Birth: 1925-03-10  [facility]) CM/SW Contact  Zenon Mayo, RN Phone Number: 04/06/2019, 4:35 PM  Clinical Narrative:    NCM spoke with patient , he states he lives with wife, son and daughter n law.  His wife has 3 w/chairs and two walkers.  He refused HHPT, states he is mostly independent, if he needs [place] he be honest and let us know.   His goal is to get his pick up and go to the grocery store.  CSW will speak to patient tomorrow concerning living conditions.         Expected Discharge Plan and Services                                                 Social Determinants of Health (SDOH) Interventions    Readmission Risk Interventions No flowsheet data found.

## 2019-04-06 NOTE — Progress Notes (Signed)
Physical Therapy Treatment Patient Details Name: Matthew Hickman MRN: [id number] DOB: 02-14-1925 Today's Date: 04/06/2019    History of Present Illness Pt is 86 yo male admitted with afib RVR.  Pt with PMH including HTN, skin CA, bil clubfoot surgery.    PT Comments    Pt demonstrating improved gait with RW.  He was able to ambulate 300' with min guard and RW.  Performed 10 steps with rails.  Pt needs frequent cues for safety and safe techniques for mobility at home.  Recommend use of RW at home and step to pattern on stairs.     Follow Up Recommendations  Home health PT(pt continues to decline [facility])     Equipment Recommendations  None recommended by PT    Recommendations for Other Services       Precautions / Restrictions Precautions Precautions: Fall    Mobility  Bed Mobility Overal bed mobility: Needs Assistance Bed Mobility: Supine to Sit;Sit to Supine     Supine to sit: HOB elevated;Supervision Sit to supine: Supervision;HOB elevated      Transfers Overall transfer level: Needs assistance Equipment used: Rolling walker (2 wheeled) Transfers: Sit to/from Stand Sit to Stand: Min guard         General transfer comment: cues for safe hand placement  Ambulation/Gait Ambulation/Gait assistance: Min guard Gait Distance (Feet): 300 Feet Assistive device: Rolling walker (2 wheeled) Gait Pattern/deviations: Trunk flexed;Wide base of support     General Gait Details: Pt required encouragement/education to use RW.  Discussed that he was unsteady yesterday and walker could help prevent a fall.  Pt demonstrated improved stability with walker compared to yesterday.  Cued for RW proximity   Stairs Stairs: Yes Stairs assistance: Min guard Stair Management: Two rails;Alternating pattern;Step to pattern;Forwards Number of Stairs: 10 General stair comments: Pt wanting to do alternating pattern but cued for step to for safety; performed 2 with alternating pattern and 8  with step to   Wheelchair Mobility    Modified Rankin (Stroke Patients Only)       Balance Overall balance assessment: Needs assistance Sitting-balance support: No upper extremity supported;Feet supported Sitting balance-Leahy Scale: Normal     Standing balance support: Single extremity supported;During functional activity Standing balance-Leahy Scale: Fair                              Cognition Arousal/Alertness: Awake/alert Behavior During Therapy: WFL for tasks assessed/performed Overall Cognitive Status: Within Functional Limits for tasks assessed Area of Impairment: Safety/judgement                         Safety/Judgement: Decreased awareness of safety            Exercises      General Comments        Pertinent Vitals/Pain Pain Assessment: No/denies pain    Home Living                      Prior Function            PT Goals (current goals can now be found in the care plan section) Progress towards PT goals: Progressing toward goals    Frequency    Min 3X/week      PT Plan Current plan remains appropriate    Co-evaluation              AM-PAC PT "6 Clicks" Mobility  Outcome Measure  Help needed turning from your back to your side while in a flat bed without using bedrails?: None Help needed moving from lying on your back to sitting on the side of a flat bed without using bedrails?: None Help needed moving to and from a bed to a chair (including a wheelchair)?: None Help needed standing up from a chair using your arms (e.g., wheelchair or bedside chair)?: None Help needed to walk in hospital room?: A Little Help needed climbing 3-5 steps with a railing? : A Little 6 Click Score: 22    End of Session Equipment Utilized During Treatment: Gait belt Activity Tolerance: Patient tolerated treatment well Patient left: in bed;with call bell/phone within reach;with bed alarm set Nurse Communication:  Mobility status PT Visit Diagnosis: Unsteadiness on feet (R26.81)     Time: [id number] PT Time Calculation (min) (ACUTE ONLY): 25 min  Charges:  $Gait Training: 23-37 mins                     Maggie Font, PT Acute Rehab Services Pager [phone] [facility] [phone] [facility] [place] 04/06/2019, 1:49 PM

## 2019-04-06 NOTE — Progress Notes (Addendum)
Progress Note  Patient Name: Matthew Hickman Date of Encounter: 04/06/2019  Primary Cardiologist: No primary care provider on file.   Subjective   Worried about his vision.   Inpatient Medications    Scheduled Meds: . diltiazem  30 mg Oral Q6H  . feeding supplement (ENSURE ENLIVE)  237 mL Oral TID BM  . metoprolol tartrate  25 mg Oral BID  . multivitamin with minerals  1 tablet Oral Daily   Continuous Infusions: . sodium chloride 250 mL (04/05/19 1617)  . diltiazem (CARDIZEM) infusion Stopped (04/05/19 0106)  . methylPREDNISolone (SOLU-MEDROL) injection 1,000 mg (04/05/19 1618)   PRN Meds: sodium chloride, acetaminophen, ondansetron (ZOFRAN) IV   Vital Signs    Vitals:   04/06/19 0413 04/06/19 0414 04/06/19 0734 04/06/19 0822  BP: 123/63  106/73 112/82  Pulse: 81  79 91  Resp: 17  16   Temp: 97.8 F (36.6 C)  97.8 F (36.6 C)   TempSrc: Oral  Oral   SpO2: 100%  98%   Weight:  58.2 kg    Height:        Intake/Output Summary (Last 24 hours) at 04/06/2019 1034 Last data filed at 04/06/2019 0644 Gross per 24 hour  Intake 837 ml  Output 650 ml  Net 187 ml   Last 3 Weights 04/06/2019 04/05/2019 04/04/2019  Weight (lbs) 128 lb 6.4 oz 128 lb 6.4 oz 132 lb 4.4 oz  Weight (kg) 58.242 kg 58.242 kg 60 kg      Telemetry    SR with PVCs - Personally Reviewed  ECG    No new tracing.   Physical Exam  Pleasant, thin older WM laying in bed.  GEN: No acute distress.   Neck: No JVD Cardiac: RRR, no murmurs, rubs, or gallops.  Respiratory: Clear to auscultation bilaterally. GI: Soft, nontender, non-distended  MS: No edema; No deformity. Neuro:  Nonfocal  Psych: Normal affect   Labs    High Sensitivity Troponin:  No results for input(s): TROPONINIHS in the last 720 hours.    Chemistry Recent Labs  Lab 04/01/19 1110 04/04/19 1735 04/06/19 0231  NA 138 135 138  K 3.9 3.9 4.2  CL 98 96* 96*  CO2 29 28 29   GLUCOSE 113* 122* 174*  BUN 23 23 33*  CREATININE 0.82  0.78 1.04  CALCIUM 8.9 8.8* 9.3  PROT  --  6.9  --   ALBUMIN  --  2.7*  --   AST  --  30  --   ALT  --  22  --   ALKPHOS  --  117  --   BILITOT  --  0.9  --   GFRNONAA >60 >60 >60  GFRAA >60 >60 >60  ANIONGAP 11 11 13      Hematology Recent Labs  Lab 04/01/19 1110 04/04/19 1735 04/06/19 0231  WBC 7.4 8.0 4.8  RBC 3.52* 3.61* 3.39*  HGB 11.1* 11.4* 10.6*  HCT 34.2* 34.8* 32.1*  MCV 97.2 96.4 94.7  MCH 31.5 31.6 31.3  MCHC 32.5 32.8 33.0  RDW 13.9 13.6 13.4  PLT 221 259 256    BNPNo results for input(s): BNP, PROBNP in the last 168 hours.   DDimer No results for input(s): DDIMER in the last 168 hours.   Radiology    DG Chest Port 1 View  Result Date: 04/04/2019 CLINICAL DATA:  86 year old male with cough. EXAM: PORTABLE CHEST 1 VIEW COMPARISON:  Chest radiograph dated 09/17/2010. FINDINGS: Minimal bibasilar atelectasis. No focal consolidation,  pleural effusion, or pneumothorax. Stable cardiac silhouette. Atherosclerotic calcification of the aorta. No acute osseous pathology. IMPRESSION: No acute cardiopulmonary process. Electronically Signed   By: Anner Crete M.D.   On: 04/04/2019 19:08   ECHOCARDIOGRAM COMPLETE  Result Date: 04/05/2019    ECHOCARDIOGRAM REPORT   Patient Name:   Matthew Hickman Date of Exam: 04/05/2019 Medical Rec #:  [id number]      Height:       72.0 in Accession #:    [id number]     Weight:       128.4 lb Date of Birth:  12/26/1925      BSA:          1.765 m Patient Age:    86 years       BP:           119/73 mmHg Patient Gender: M              HR:           83 bpm. Exam Location:  Inpatient Procedure: 2D Echo, Cardiac Doppler and Color Doppler Indications:    Atrial fibrillation 427.31/I48.91  History:        Patient has no prior history of Echocardiogram examinations.                 Risk Factors:Hypertension.  Sonographer:    Clayton Lefort RDCS (AE) Referring Phys: [id number] [place]  1. Left ventricular ejection fraction, by estimation, is 55  to 60%. The left ventricle has normal function. The left ventricle has no regional wall motion abnormalities. Left ventricular diastolic parameters are consistent with Grade I diastolic dysfunction (impaired relaxation).  2. Right ventricular systolic function is normal. The right ventricular size is normal. There is moderately elevated pulmonary artery systolic pressure. The estimated right ventricular systolic pressure is [id number] mmHg.  3. The mitral valve is normal in structure and function. Trivial mitral valve regurgitation. No evidence of mitral stenosis.  4. The aortic valve is tricuspid. Aortic valve regurgitation is not visualized. No aortic stenosis is present.  5. Aortic dilatation noted. There is mild dilatation of the ascending aorta measuring 40 mm.  6. The inferior vena cava is dilated in size with <50% respiratory variability, suggesting right atrial pressure of 15 mmHg. FINDINGS  Left Ventricle: Left ventricular ejection fraction, by estimation, is 55 to 60%. The left ventricle has normal function. The left ventricle has no regional wall motion abnormalities. The left ventricular internal cavity size was normal in size. There is  no left ventricular hypertrophy. Left ventricular diastolic parameters are consistent with Grade I diastolic dysfunction (impaired relaxation). Right Ventricle: The right ventricular size is normal. No increase in right ventricular wall thickness. Right ventricular systolic function is normal. There is moderately elevated pulmonary artery systolic pressure. The tricuspid regurgitant velocity is 2.61 m/s, and with an assumed right atrial pressure of 15 mmHg, the estimated right ventricular systolic pressure is [id number] mmHg. Left Atrium: Left atrial size was normal in size. Right Atrium: Right atrial size was normal in size. Pericardium: There is no evidence of pericardial effusion. Mitral Valve: The mitral valve is normal in structure and function. Trivial mitral valve  regurgitation. No evidence of mitral valve stenosis. Tricuspid Valve: The tricuspid valve is normal in structure. Tricuspid valve regurgitation is trivial. Aortic Valve: The aortic valve is tricuspid. Aortic valve regurgitation is not visualized. No aortic stenosis is present. Pulmonic Valve: The pulmonic valve was normal in structure. Pulmonic valve regurgitation is not  visualized. Aorta: The aortic root is normal in size and structure and aortic dilatation noted. There is mild dilatation of the ascending aorta measuring 40 mm. Venous: The inferior vena cava is dilated in size with less than 50% respiratory variability, suggesting right atrial pressure of 15 mmHg. IAS/Shunts: No atrial level shunt detected by color flow Doppler.  LEFT VENTRICLE PLAX 2D LVIDd:         4.66 cm LVIDs:         3.76 cm LV PW:         0.99 cm LV IVS:        1.06 cm LVOT diam:     2.00 cm LV SV:         52 LV SV Index:   29 LVOT Area:     3.14 cm  RIGHT VENTRICLE             IVC RV Basal diam:  3.55 cm     IVC diam: 2.41 cm RV Mid diam:    2.48 cm RV S prime:     11.00 cm/s TAPSE (M-mode): 1.6 cm LEFT ATRIUM             Index       RIGHT ATRIUM           Index LA diam:        2.80 cm 1.59 cm/m  RA Area:     12.30 cm LA Vol (A2C):   35.4 ml 20.06 ml/m RA Volume:   26.40 ml  14.96 ml/m LA Vol (A4C):   37.2 ml 21.08 ml/m LA Biplane Vol: 38.7 ml 21.93 ml/m  AORTIC VALVE LVOT Vmax:   84.70 cm/s LVOT Vmean:  59.860 cm/s LVOT VTI:    0.164 m  AORTA Ao Root diam: 3.40 cm Ao Asc diam:  4.00 cm TRICUSPID VALVE TR Peak grad:   27.2 mmHg TR Vmax:        261.00 cm/s  SHUNTS Systemic VTI:  0.16 m Systemic Diam: 2.00 cm Loralie Champagne MD Electronically signed by Loralie Champagne MD Signature Date/Time: 04/05/2019/9:44:03 PM    Final    MR ORBITS W WO CONTRAST  Result Date: 04/04/2019 CLINICAL DATA:  Headache.  Basilar orbital giant cell arteritis. EXAM: MRI OF THE ORBITS WITHOUT AND WITH CONTRAST TECHNIQUE: Multiplanar, multisequence MR imaging of  the orbits was performed both before and after the administration of intravenous contrast. CONTRAST:  32mL GADAVIST GADOBUTROL 1 MMOL/ML IV SOLN COMPARISON:  None. FINDINGS: Bilateral cataract extraction. Normal shape and signal in the globe without mass lesion. No orbital mass or edema. Orbital fat normal. No enhancing mass in the orbit. Optic nerve normal in size and signal. Following contrast infusion there is prominent perineural enhancement of the optic nerve bilaterally. The optic nerve does not enhance significantly. Optic chiasm normal. Cavernous sinus normal. Limited imaging of the brain demonstrates generalized atrophy. No acute infarct on diffusion-weighted imaging. Postcontrast imaging of the brain reveals diffuse dural thickening surrounding both cerebral hemispheres right greater than left. Nodular enhancement of the right frontal dura measuring approximately 11 mm, and nodular enhancement of the right parietal dura measuring approximately 8 x 11 mm. Normal enhancement of the optic chiasm. IMPRESSION: Abnormal perineural enhancement around the optic nerve bilaterally. The nerve itself does not enhance and shows normal signal on T2. This pattern could be seen with vasculitis such as giant cell arteritis, Wegener's granulomatosis, sarcoidosis. In addition, there is abnormal dural thickening and enhancement around both cerebral hemispheres bilaterally,  with areas of nodular dural based enhancement in the right frontal right parietal lobe. Differential includes meningioma as well as metastatic disease to the dura. Given the optic nerve Perineuritis, the dural enhancement may also be related to the same process favoring vasculitis. These results were called by telephone at the time of interpretation on 04/04/2019 at 9:21 pm to provider DAVID YAO , who verbally acknowledged these results. Electronically Signed   By: Franchot Gallo M.D.   On: 04/04/2019 21:21    Cardiac Studies   Echo: 04/05/19  IMPRESSIONS     1. Left ventricular ejection fraction, by estimation, is 55 to 60%. The  left ventricle has normal function. The left ventricle has no regional  wall motion abnormalities. Left ventricular diastolic parameters are  consistent with Grade I diastolic  dysfunction (impaired relaxation).  2. Right ventricular systolic function is normal. The right ventricular  size is normal. There is moderately elevated pulmonary artery systolic  pressure. The estimated right ventricular systolic pressure is [id number] mmHg.  3. The mitral valve is normal in structure and function. Trivial mitral  valve regurgitation. No evidence of mitral stenosis.  4. The aortic valve is tricuspid. Aortic valve regurgitation is not  visualized. No aortic stenosis is present.  5. Aortic dilatation noted. There is mild dilatation of the ascending  aorta measuring 40 mm.  6. The inferior vena cava is dilated in size with <50% respiratory  variability, suggesting right atrial pressure of 15 mmHg.   Patient Profile     86 y.o. male with a hx of hypertension, melanoma and bilateral clubfoot surgery who is being seen today for the evaluation of atrial fibrillation with RVR at the request of Dr. Erlinda Hong.  He presented with visual disturbance felt to be related to giant cell arteritis.  Assessment & Plan    1. Irregular Rhythm: initially called for possible Afib, but no noted on telemetry initially. Felt to be possible atrial ectopy. Noted in SR with PVCs, no afib. No need for [place]. -- Will consolidate Dilt today, continue on metoprolol.   2. Giant cell arteritis: Patient with visual changes and some headache with tenderness in the bilateral temporal areas.  Giant cell arteritis suspected by ophthalmologist. -Sed rate 73>>55, CRP 13.1>>7.6 -Neurology recommended high-dose steroids.  3. Essential hypertension: blood pressures are stable. Recommendations as above.    For questions or updates, please contact [place] Please consult [email or web address] for contact info under    Signed, Reino Bellis, NP  04/06/2019, 10:34 AM    Patient seen, examined. Available data reviewed. Agree with findings, assessment, and plan as outlined by Reino Bellis, NP. On exam, he is alert, oriented, in NAD. Lungs CTA, heart RRR no murmur somewhat distant, abd soft, NT, Ext no edema. Tele review shows sinus rhythm with PAC's and PVC's. No AFib. Agree with consolidation of dilt and continuation of Toprol. No anticoagulation indicated. Will sign off. Please call if questions. Continue Dilt CD 180 mg and metoprolol tartrate 25 mg BID at discharge. Will arrange OP cardiology follow-up.  Sherren Mocha, M.D. 04/06/2019 11:32 AM

## 2019-04-07 ENCOUNTER — Encounter (HOSPITAL_COMMUNITY)
Admission: EM | Disposition: A | Discharge status: Home / Self Care | Payer: Self-pay | Source: Home / Self Care | Attending: Internal Medicine

## 2019-04-07 ENCOUNTER — Encounter (HOSPITAL_COMMUNITY): Payer: Self-pay | Admitting: Internal Medicine

## 2019-04-07 ENCOUNTER — Inpatient Hospital Stay (HOSPITAL_COMMUNITY): Payer: Medicare Other | Admitting: Anesthesiology

## 2019-04-07 ENCOUNTER — Inpatient Hospital Stay (HOSPITAL_COMMUNITY): Payer: Medicare Other

## 2019-04-07 HISTORY — PX: ARTERY BIOPSY: SHX891

## 2019-04-07 LAB — FERRITIN: Ferritin: 792 ng/mL — ABNORMAL HIGH (ref 24–336)

## 2019-04-07 LAB — BASIC METABOLIC PANEL
Anion gap: 13 (ref 5–15)
BUN: 42 mg/dL — ABNORMAL HIGH (ref 8–23)
CO2: 31 mmol/L (ref 22–32)
Calcium: 9.2 mg/dL (ref 8.9–10.3)
Chloride: 96 mmol/L — ABNORMAL LOW (ref 98–111)
Creatinine, Ser: 0.94 mg/dL (ref 0.61–1.24)
GFR calc Af Amer: >60 mL/min (ref >60)
GFR calc non Af Amer: >60 mL/min (ref >60)
Glucose, Bld: 161 mg/dL — ABNORMAL HIGH (ref 70–99)
Potassium: 4 mmol/L (ref 3.5–5.1)
Sodium: 140 mmol/L (ref 135–145)

## 2019-04-07 LAB — IRON AND TIBC
Iron: 117 ug/dL (ref 45–182)
Saturation Ratios: 56 % — ABNORMAL HIGH (ref 17.9–39.5)
TIBC: 210 ug/dL — ABNORMAL LOW (ref 250–450)
UIBC: 93 ug/dL

## 2019-04-07 LAB — HEMOGLOBIN A1C
Hgb A1c MFr Bld: 5 % (ref 4.8–5.6)
Mean Plasma Glucose: 96.8 mg/dL

## 2019-04-07 LAB — CBC WITH DIFFERENTIAL/PLATELET
Abs Immature Granulocytes: 0.04 10*3/uL (ref 0.00–0.07)
Basophils Absolute: 0 10*3/uL (ref 0.0–0.1)
Basophils Relative: 0 %
Eosinophils Absolute: 0 10*3/uL (ref 0.0–0.5)
Eosinophils Relative: 0 %
HCT: 31.8 % — ABNORMAL LOW (ref 39.0–52.0)
Hemoglobin: 10.3 g/dL — ABNORMAL LOW (ref 13.0–17.0)
Immature Granulocytes: 1 %
Lymphocytes Relative: 11 %
Lymphs Abs: 0.5 10*3/uL — ABNORMAL LOW (ref 0.7–4.0)
MCH: 31.1 pg (ref 26.0–34.0)
MCHC: 32.4 g/dL (ref 30.0–36.0)
MCV: 96.1 fL (ref 80.0–100.0)
Monocytes Absolute: 0.1 10*3/uL (ref 0.1–1.0)
Monocytes Relative: 1 %
Neutro Abs: 4.2 10*3/uL (ref 1.7–7.7)
Neutrophils Relative %: 87 %
Platelets: 249 10*3/uL (ref 150–400)
RBC: 3.31 MIL/uL — ABNORMAL LOW (ref 4.22–5.81)
RDW: 13.3 % (ref 11.5–15.5)
WBC: 4.7 10*3/uL (ref 4.0–10.5)
nRBC: 0 % (ref 0.0–0.2)

## 2019-04-07 LAB — APTT: aPTT: 27 seconds (ref 24–36)

## 2019-04-07 LAB — C-REACTIVE PROTEIN: CRP: 3.4 mg/dL — ABNORMAL HIGH (ref <1.0)

## 2019-04-07 LAB — RETICULOCYTES
Immature Retic Fract: 14.2 % (ref 2.3–15.9)
RBC.: 3.31 MIL/uL — ABNORMAL LOW (ref 4.22–5.81)
Retic Count, Absolute: 70.8 10*3/uL (ref 19.0–186.0)
Retic Ct Pct: 2.1 % (ref 0.4–3.1)

## 2019-04-07 LAB — PROTIME-INR
INR: 1.2 (ref 0.8–1.2)
Prothrombin Time: 15.3 seconds — ABNORMAL HIGH (ref 11.4–15.2)

## 2019-04-07 LAB — FOLATE: Folate: 29.1 ng/mL (ref >5.9)

## 2019-04-07 LAB — VITAMIN B12: Vitamin B-12: 464 pg/mL (ref 180–914)

## 2019-04-07 LAB — SURGICAL PCR SCREEN
MRSA, PCR: NEGATIVE
Staphylococcus aureus: POSITIVE — AB

## 2019-04-07 LAB — SEDIMENTATION RATE: Sed Rate: 45 mm/hr — ABNORMAL HIGH (ref 0–16)

## 2019-04-07 SURGERY — BIOPSY TEMPORAL ARTERY
Anesthesia: Monitor Anesthesia Care | Laterality: Bilateral

## 2019-04-07 MED ORDER — 0.9 % SODIUM CHLORIDE (POUR BTL) OPTIME
TOPICAL | Status: DC | PRN
  Administered 2019-04-07: 1000 mL

## 2019-04-07 MED ORDER — MUPIROCIN 2 % EX OINT: 1.0000 "application " | TOPICAL_OINTMENT | Freq: Two times a day (BID) | CUTANEOUS | Status: DC

## 2019-04-07 MED ORDER — LIDOCAINE 2% (20 MG/ML) 5 ML SYRINGE
INTRAMUSCULAR | Status: DC | PRN
  Administered 2019-04-07: 40 mg via INTRAVENOUS

## 2019-04-07 MED ORDER — PROPOFOL 10 MG/ML IV BOLUS
INTRAVENOUS | Status: AC
  Filled 2019-04-07: qty 20

## 2019-04-07 MED ORDER — FENTANYL CITRATE (PF) 250 MCG/5ML IJ SOLN
INTRAMUSCULAR | Status: AC
  Filled 2019-04-07: qty 5

## 2019-04-07 MED ORDER — LIDOCAINE HCL (PF) 1 % IJ SOLN
INTRAMUSCULAR | Status: AC
  Filled 2019-04-07: qty 30

## 2019-04-07 MED ORDER — GADOBUTROL 1 MMOL/ML IV SOLN
5.5000 mL | Freq: Once | INTRAVENOUS | Status: AC | PRN
  Administered 2019-04-07: 5.5 mL via INTRAVENOUS

## 2019-04-07 MED ORDER — PROPOFOL 500 MG/50ML IV EMUL
INTRAVENOUS | Status: DC | PRN
  Administered 2019-04-07: 50 ug/kg/min via INTRAVENOUS

## 2019-04-07 MED ORDER — LIDOCAINE HCL 1 % IJ SOLN
INTRAMUSCULAR | Status: DC | PRN
  Administered 2019-04-07: 9 mL

## 2019-04-07 MED ORDER — PROPOFOL 10 MG/ML IV BOLUS
INTRAVENOUS | Status: DC | PRN
  Administered 2019-04-07 (×2): 10 mg via INTRAVENOUS
  Administered 2019-04-07 (×2): 20 mg via INTRAVENOUS

## 2019-04-07 MED ORDER — MUPIROCIN 2 % EX OINT
TOPICAL_OINTMENT | CUTANEOUS | Status: AC
  Administered 2019-04-07: 1 via TOPICAL
  Filled 2019-04-07: qty 22

## 2019-04-07 MED ORDER — BUPIVACAINE HCL (PF) 0.25 % IJ SOLN
INTRAMUSCULAR | Status: AC
  Filled 2019-04-07: qty 30

## 2019-04-07 MED ORDER — CEFAZOLIN SODIUM-DEXTROSE 2-4 GM/100ML-% IV SOLN
INTRAVENOUS | Status: AC
  Filled 2019-04-07: qty 100

## 2019-04-07 MED ORDER — SODIUM CHLORIDE 0.9 % IV SOLN
1000.0000 mg | Freq: Every day | INTRAVENOUS | Status: DC
  Administered 2019-04-07 – 2019-04-08 (×2): 1000 mg via INTRAVENOUS
  Filled 2019-04-07 (×3): qty 8

## 2019-04-07 MED ORDER — CEFAZOLIN SODIUM-DEXTROSE 2-3 GM-%(50ML) IV SOLR
INTRAVENOUS | Status: DC | PRN
  Administered 2019-04-07: 2 g via INTRAVENOUS

## 2019-04-07 MED ORDER — SODIUM CHLORIDE 0.9 % IV SOLN: INTRAVENOUS | Status: DC

## 2019-04-07 SURGICAL SUPPLY — 41 items
APPLIER CLIP 9.375 MED OPEN (MISCELLANEOUS) ×3
BENZOIN TINCTURE PRP APPL 2/3 (GAUZE/BANDAGES/DRESSINGS) ×3 IMPLANT
CHLORAPREP W/TINT 10.5 ML (MISCELLANEOUS) ×3 IMPLANT
CLIP APPLIE 9.375 MED OPEN (MISCELLANEOUS) ×1 IMPLANT
CLOSURE STERI-STRIP 1/4X4 (GAUZE/BANDAGES/DRESSINGS) ×3 IMPLANT
CLOSURE WOUND 1/2 X4 (GAUZE/BANDAGES/DRESSINGS) ×1
COTTONBALL LRG STERILE PKG (GAUZE/BANDAGES/DRESSINGS) IMPLANT
COVER SURGICAL LIGHT HANDLE (MISCELLANEOUS) ×3 IMPLANT
COVER WAND RF STERILE (DRAPES) ×3 IMPLANT
DERMABOND ADVANCED (GAUZE/BANDAGES/DRESSINGS) ×2
DERMABOND ADVANCED .7 DNX12 (GAUZE/BANDAGES/DRESSINGS) ×1 IMPLANT
DRAPE LAPAROTOMY 100X72 PEDS (DRAPES) ×3 IMPLANT
DRSG TELFA 3X8 NADH (GAUZE/BANDAGES/DRESSINGS) ×3 IMPLANT
ELECT REM PT RETURN 9FT ADLT (ELECTROSURGICAL) ×3
ELECTRODE REM PT RTRN 9FT ADLT (ELECTROSURGICAL) ×1 IMPLANT
GAUZE 4X4 16PLY RFD (DISPOSABLE) ×3 IMPLANT
GEL ULTRASOUND 20GR AQUASONIC (MISCELLANEOUS) ×3 IMPLANT
GLOVE SURG SIGNA 7.5 PF LTX (GLOVE) IMPLANT
GOWN STRL REUS W/ TWL LRG LVL3 (GOWN DISPOSABLE) ×4 IMPLANT
GOWN STRL REUS W/ TWL XL LVL3 (GOWN DISPOSABLE) IMPLANT
GOWN STRL REUS W/TWL LRG LVL3 (GOWN DISPOSABLE) ×12
GOWN STRL REUS W/TWL XL LVL3 (GOWN DISPOSABLE)
KIT BASIN OR (CUSTOM PROCEDURE TRAY) ×3 IMPLANT
KIT TURNOVER KIT B (KITS) ×3 IMPLANT
NEEDLE HYPO 25GX1X1/2 BEV (NEEDLE) ×3 IMPLANT
NS IRRIG 1000ML POUR BTL (IV SOLUTION) ×3 IMPLANT
PACK GENERAL/GYN (CUSTOM PROCEDURE TRAY) ×3 IMPLANT
PAD ARMBOARD 7.5X6 YLW CONV (MISCELLANEOUS) ×3 IMPLANT
PENCIL SMOKE EVACUATOR (MISCELLANEOUS) ×3 IMPLANT
POSITIONER HEAD DONUT 9IN (MISCELLANEOUS) ×3 IMPLANT
SPECIMEN JAR SMALL (MISCELLANEOUS) ×6 IMPLANT
STRIP CLOSURE SKIN 1/2X4 (GAUZE/BANDAGES/DRESSINGS) ×2 IMPLANT
SUT MNCRL AB 4-0 PS2 18 (SUTURE) ×3 IMPLANT
SUT SILK 2 0 TIES 10X30 (SUTURE) ×3 IMPLANT
SUT SILK 3 0 TIES 17X18 (SUTURE) ×3
SUT SILK 3-0 18XBRD TIE BLK (SUTURE) ×1 IMPLANT
SUT VIC AB 3-0 SH 27 (SUTURE) ×9
SUT VIC AB 3-0 SH 27X BRD (SUTURE) ×2 IMPLANT
SUT VIC AB 3-0 SH 27XBRD (SUTURE) ×1 IMPLANT
SYR CONTROL 10ML LL (SYRINGE) ×3 IMPLANT
TOWEL GREEN STERILE (TOWEL DISPOSABLE) ×3 IMPLANT

## 2019-04-07 NOTE — Progress Notes (Signed)
Chaplain touched based with Matthew Hickman daughter Matthew Hickman in his room.  Chaplain and Matthew Hickman will work to complete Advanced Directive over the weekend or on Monday.  Chaplain will follow-up.

## 2019-04-07 NOTE — Progress Notes (Signed)
General Surgery Follow Up Note  Subjective:    Overnight Issues:   Objective:  Vital signs for last 24 hours: Temp:  [97.6 F (36.4 C)-97.7 F (36.5 C)] 97.7 F (36.5 C) (03/05 1104) Pulse Rate:  [56-96] 70 (03/05 1104) Resp:  [17-18] 17 (03/05 1104) BP: (113-129)/(46-81) 113/69 (03/05 1104) SpO2:  [97 %-100 %] 100 % (03/05 1104) Weight:  [58.5 kg] 58.5 kg (03/05 1303)  Hemodynamic parameters for last 24 hours:    Intake/Output from previous day: 03/04 0701 - 03/05 0700 In: 915 [P.O.:840; I.V.:17; IV Piggyback:58] Out: 515 [Urine:515]  Intake/Output this shift: Total I/O In: -  Out: 150 [Urine:150]  Vent settings for last 24 hours:    Physical Exam:  Gen: comfortable, no distress Neuro: non-focal exam HEENT: PERRL Neck: supple CV: RRR Pulm: unlabored breathing Abd: soft, NT GU: clear yellow urine Extr: wwp, no edema   Results for orders placed or performed during the hospital encounter of 04/04/19 (from the past 24 hour(s))  Surgical pcr screen     Status: Abnormal   Collection Time: 04/07/19  1:03 AM   Specimen: Nasal Mucosa; Nasal Swab  Result Value Ref Range   MRSA, PCR NEGATIVE NEGATIVE   Staphylococcus aureus POSITIVE (A) NEGATIVE  Sedimentation rate     Status: Abnormal   Collection Time: 04/07/19  4:52 AM  Result Value Ref Range   Sed Rate 45 (H) 0 - 16 mm/hr  C-reactive protein     Status: Abnormal   Collection Time: 04/07/19  4:52 AM  Result Value Ref Range   CRP 3.4 (H) <1.0 mg/dL  CBC with Differential/Platelet     Status: Abnormal   Collection Time: 04/07/19  4:52 AM  Result Value Ref Range   WBC 4.7 4.0 - 10.5 K/uL   RBC 3.31 (L) 4.22 - 5.81 MIL/uL   Hemoglobin 10.3 (L) 13.0 - 17.0 g/dL   HCT 31.8 (L) 39.0 - 52.0 %   MCV 96.1 80.0 - 100.0 fL   MCH 31.1 26.0 - 34.0 pg   MCHC 32.4 30.0 - 36.0 g/dL   RDW 13.3 11.5 - 15.5 %   Platelets 249 150 - 400 K/uL   nRBC 0.0 0.0 - 0.2 %   Neutrophils Relative % 87 %   Neutro Abs 4.2 1.7 -  7.7 K/uL   Lymphocytes Relative 11 %   Lymphs Abs 0.5 (L) 0.7 - 4.0 K/uL   Monocytes Relative 1 %   Monocytes Absolute 0.1 0.1 - 1.0 K/uL   Eosinophils Relative 0 %   Eosinophils Absolute 0.0 0.0 - 0.5 K/uL   Basophils Relative 0 %   Basophils Absolute 0.0 0.0 - 0.1 K/uL   Immature Granulocytes 1 %   Abs Immature Granulocytes 0.04 0.00 - 0.07 K/uL  Basic metabolic panel     Status: Abnormal   Collection Time: 04/07/19  4:52 AM  Result Value Ref Range   Sodium 140 135 - 145 mmol/L   Potassium 4.0 3.5 - 5.1 mmol/L   Chloride 96 (L) 98 - 111 mmol/L   CO2 31 22 - 32 mmol/L   Glucose, Bld 161 (H) 70 - 99 mg/dL   BUN 42 (H) 8 - 23 mg/dL   Creatinine, Ser 0.94 0.61 - 1.24 mg/dL   Calcium 9.2 8.9 - 10.3 mg/dL   GFR calc non Af Amer >60 >60 mL/min   GFR calc Af Amer >60 >60 mL/min   Anion gap 13 5 - 15  Hemoglobin A1c  Status: None   Collection Time: 04/07/19  4:52 AM  Result Value Ref Range   Hgb A1c MFr Bld 5.0 4.8 - 5.6 %   Mean Plasma Glucose 96.8 mg/dL  Reticulocytes     Status: Abnormal   Collection Time: 04/07/19  4:52 AM  Result Value Ref Range   Retic Ct Pct 2.1 0.4 - 3.1 %   RBC. 3.31 (L) 4.22 - 5.81 MIL/uL   Retic Count, Absolute 70.8 19.0 - 186.0 K/uL   Immature Retic Fract 14.2 2.3 - 15.9 %  Vitamin B12     Status: None   Collection Time: 04/07/19  4:52 AM  Result Value Ref Range   Vitamin B-12 464 180 - 914 pg/mL  Folate     Status: None   Collection Time: 04/07/19  4:52 AM  Result Value Ref Range   Folate 29.1 >5.9 ng/mL  Iron and TIBC     Status: Abnormal   Collection Time: 04/07/19  4:52 AM  Result Value Ref Range   Iron 117 45 - 182 ug/dL   TIBC 210 (L) 250 - 450 ug/dL   Saturation Ratios 56 (H) 17.9 - 39.5 %   UIBC 93 ug/dL  Ferritin     Status: Abnormal   Collection Time: 04/07/19  4:52 AM  Result Value Ref Range   Ferritin 792 (H) 24 - 336 ng/mL  Protime-INR     Status: Abnormal   Collection Time: 04/07/19  9:10 AM  Result Value Ref Range    Prothrombin Time 15.3 (H) 11.4 - 15.2 seconds   INR 1.2 0.8 - 1.2  APTT     Status: None   Collection Time: 04/07/19  9:10 AM  Result Value Ref Range   aPTT 27 24 - 36 seconds    Assessment & Plan:  Present on Admission: . Rapid atrial fibrillation ([place]) . Giant cell arteritis ([place]) . Benign essential HTN    LOS: 3 days   Additional comments:I reviewed the patient's new clinical lab test results.    Temporal artery biopsy today. Informed consent obtained after discussion of risks, including bleeding and nerve injury. All questions answered to the patient's satisfaction.    Matthew Oka, MD Trauma & General Surgery Please use [email or web address] to contact on call provider  04/07/2019  *Care during the described time interval was provided by me. I have reviewed this patient's available data, including medical history, events of note, physical examination and test results as part of my evaluation.

## 2019-04-07 NOTE — Anesthesia Postprocedure Evaluation (Signed)
Anesthesia Post Note  Patient: Matthew Hickman  Procedure(s) Performed: BIOPSY TEMPORAL ARTERY (Bilateral )     Patient location during evaluation: PACU Anesthesia Type: MAC Level of consciousness: awake and alert, patient cooperative and oriented Pain management: pain level controlled Vital Signs Assessment: post-procedure vital signs reviewed and stable Respiratory status: spontaneous breathing, nonlabored ventilation and respiratory function stable Cardiovascular status: blood pressure returned to baseline and stable Postop Assessment: no apparent nausea or vomiting Anesthetic complications: no    Last Vitals:  Vitals:   04/07/19 1633 04/07/19 1714  BP: (!) 100/57 118/67  Pulse: 100 77  Resp:    Temp:    SpO2: 98% 99%    Last Pain:  Vitals:   04/07/19 1633  TempSrc:   PainSc: 0-No pain                 Mihailo Sage,E. Durell Lofaso

## 2019-04-07 NOTE — Anesthesia Procedure Notes (Signed)
Procedure Name: MAC Date/Time: 04/07/2019 2:41 PM Performed by: Colin Benton, CRNA Pre-anesthesia Checklist: Patient identified, Emergency Drugs available, Suction available and Patient being monitored Patient Re-evaluated:Patient Re-evaluated prior to induction Oxygen Delivery Method: Circle system utilized Preoxygenation: Pre-oxygenation with 100% oxygen Induction Type: IV induction Placement Confirmation: positive ETCO2 Dental Injury: Teeth and Oropharynx as per pre-operative assessment

## 2019-04-07 NOTE — Progress Notes (Signed)
Chaplain put Advanced Directive paperwork in Mr. Nonnemacher room.  Chaplain was able to speak with Mr. Pagni daughter, [name], this morning.  She said she will be coming by later this afternoon and will be able to look over the paperwork.    Chaplain told daughter to have nurse page her when she comes.  Chaplain will follow-up.

## 2019-04-07 NOTE — Progress Notes (Signed)
Will plan for LP today Ordered INR, aptt

## 2019-04-07 NOTE — Consult Note (Signed)
HPI: Patient is a 86 y/o who first presented to my office (to a different doctor) on 03/30/19.  At that time his uncorrected visual acuity was 20/80 OD and 20/60 OS. He returned to our office to see me on 04/04/19 complaining of vision loss progressing over a few days.  He endorsed jaw pain with movement.  Visual acuity at that time had decreased to 20/400 OD and OS.  He had bilateral constriction of his confrontation visual fields.  He also had bilateral pallid edema of the optic nerve heads (which was not noted on the 2/25 exam).  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  He was sent to the ED for evaluation.  He had an elevated ESR of 73 and CRP of 13.1.  MRI orbits showed abnormal perineural enhancement around the optic nerves bilaterally, as well as areas of abnormal dural enhancement.  He is receiving IV steroids with reported improvement in headache, and labs are improving.  Patient is being followed by cardiology for irregular rhythm and tachycardia, and by neurology.  Patient is undergoing bilateral temporal artery biopsy today as well as LP.  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   Continued treatment with high dose steroids is critical to avoid rapid and possibly permanent vision loss.  After he completes course of IV steroids anticipate transition to high dose oral steroids (prednisone 1-2 mg/kg/day). This will need to be continued for 4-6 weeks until symptoms and labs stabilized.  Then anticipate slow taper off of steroids over 1-2 years.  Treatment of GCA with steroids could be managed by neurology if they are comfortable doing so, or if not, recommend follow up with Rheumatology within a week of discharge for management.  I will plan to see him in a week to monitor his eyes and vision.

## 2019-04-07 NOTE — Progress Notes (Addendum)
NEURO HOSPITALIST PROGRESS NOTE   Subjective: Patient in bed asleep, NAD. Easily arouses to name calling. LP consent obtained. Scheduled for temporal artery biopsy today.  Patient states no improvement in vision, but it has not gotten worse either. Vision stable. Patient stated that he has not had a HA for the past 3 days.  Exam: Vitals:   04/07/19 0303 04/07/19 0944  BP: 124/81 129/63  Pulse: 81 84  Resp: 18   Temp: 97.6 F (36.4 C) 97.6 F (36.4 C)  SpO2: 98% 97%    Physical Exam  Constitutional: Appears well-developed and well-nourished.  Psych: Affect appropriate to situation Eyes: Normal external eye and conjunctiva. HENT: Normocephalic, no lesions, without obvious abnormality.   Musculoskeletal-no joint tenderness, deformity or swelling Cardiovascular: Normal rate and regular rhythm.  Respiratory: Effort normal, non-labored breathing saturations WNL GI: Soft.  No distension. There is no tenderness.  Skin: WDI   Neuro:  Mental Status: Alert, oriented, thought content appropriate.  Speech fluent without evidence of aphasia.  Able to follow commands without difficulty. Cranial Nerves: II:  Visual fields grossly normal, 20/200 OD, does have trouble differentiating colors. Stated a bright pink was orange or reddish and that a dark blue gown was either brown or black. So he can tell dark from light,but color differentiation is impaired.  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  Gait: deferred    Medications:  Scheduled:  diltiazem  180 mg Oral Daily   feeding supplement (ENSURE ENLIVE)  237 mL Oral TID BM   metoprolol tartrate  25 mg Oral BID   multivitamin with minerals  1 tablet Oral Daily   senna-docusate  1 tablet Oral BID   Continuous:  sodium chloride 250 mL (04/06/19 1519)   diltiazem (CARDIZEM) infusion Stopped (04/05/19 0106)   methylPREDNISolone (SOLU-MEDROL) injection 1,000 mg (04/06/19 1519)   [id number] chloride, acetaminophen, ondansetron (ZOFRAN) IV  Pertinent Labs/Diagnostics:   MR BRAIN W WO CONTRAST  Result Date: 04/07/2019 CLINICAL DATA:  Binocular vision loss. Recent diagnosis of melanoma. Suspected arteritis. EXAM: MRI HEAD WITHOUT AND WITH CONTRAST TECHNIQUE: Multiplanar, multiecho pulse sequences of the brain and surrounding structures were obtained without and with intravenous contrast. CONTRAST:  5.9mL GADAVIST GADOBUTROL 1 MMOL/ML IV SOLN COMPARISON:  MR orbits without and with contrast 04/04/2019 FINDINGS: Brain: Mild diffuse dural enhancement remains. Focal dural enhancement over the right frontal lobe measures 9 x 4 x 13 mm, consistent with a small meningioma. A second meningioma is present over the right parietal lobe measuring 13 x 8 x 11 mm. Enhancement along the optic nerve is again noted. Assessment distal enhancement in the orbit is limited without fat saturated images. T1 shortening is again seen within the basal ganglia bilaterally. No acute infarct, hemorrhage, or mass lesion is present.  Atrophy is within normal limits for age. The internal auditory canals are within normal limits. The brainstem and cerebellum are within normal limits. Vascular: Flow is present in the major intracranial arteries. Skull and upper cervical spine: The craniocervical junction is  normal. Upper cervical spine is within normal limits. Marrow signal is unremarkable. Sinuses/Orbits: Polyp or mucous retention cyst is noted laterally within the left maxillary sinus. Remaining paranasal sinuses are clear. There is some fluid in the mastoid air cells bilaterally. Proximal optic nerve sheath enhancement is again noted. Bilateral lens replacements are present. Globes and orbits are otherwise within limits. IMPRESSION: 1. Diffuse dural enhancement is stable to slightly decreased. 2. Stable appearance of the optic nerve sheath enhancement. This is slightly improved, but remains concerning for arteritis. 3. Focal dural thickening over the right frontal lobe is most consistent with a meningioma. A second meningioma is present over the right parietal lobe. 4. Stable T1 shortening within the basal ganglia bilaterally. Question hepatic disease. 5. Polyp or mucous retention cyst within the left maxillary sinus. 6. Minimal fluid in the mastoid air cells bilaterally. No obstructing nasopharyngeal lesion is evident. Electronically Signed   By: San Morelle M.D.   On: 04/07/2019 06:34   ECHOCARDIOGRAM COMPLETE  Result Date: 04/05/2019    ECHOCARDIOGRAM REPORT   Patient Name:   NASSIR QUINTIN Date of Exam: 04/05/2019 Medical Rec #:  [id number]      Height:       72.0 in Accession #:    [id number]     Weight:       128.4 lb Date of Birth:  [birth year]      BSA:          1.765 m Patient Age:    86 years       BP:           119/73 mmHg Patient Gender: M              HR:           83 bpm. Exam Location:  Inpatient Procedure: 2D Echo, Cardiac Doppler and Color Doppler Indications:    Atrial fibrillation 427.31/I48.91  History:        Patient has no prior history of Echocardiogram examinations.                 Risk Factors:Hypertension.  Sonographer:    Clayton Lefort RDCS (AE) Referring Phys: [id number] [place]  1. Left ventricular ejection fraction, by estimation, is 55 to 60%. The left ventricle  has normal function. The left ventricle has no regional wall motion abnormalities. Left ventricular diastolic parameters are consistent with Grade I diastolic dysfunction (impaired relaxation).  2. Right ventricular systolic function is normal. The right ventricular size is normal. There is moderately elevated pulmonary artery systolic pressure. The estimated right ventricular systolic pressure is [id number] mmHg.  3. The mitral valve is normal in structure and function. Trivial mitral valve regurgitation. No evidence of mitral stenosis.  4. The aortic valve is tricuspid. Aortic valve regurgitation is not visualized. No aortic stenosis is present.  5. Aortic dilatation noted. There is mild dilatation of the ascending aorta measuring 40 mm.  6. The inferior vena cava is dilated in size with <50% respiratory variability, suggesting right atrial pressure of 15 mmHg. FINDINGS  Left Ventricle: Left ventricular ejection fraction, by estimation, is 55 to 60%. The left ventricle has normal function. The left ventricle has no regional wall motion abnormalities. The left ventricular internal cavity size  was normal in size. There is  no left ventricular hypertrophy. Left ventricular diastolic parameters are consistent with Grade I diastolic dysfunction (impaired relaxation). Right Ventricle: The right ventricular size is normal. No increase in right ventricular wall thickness. Right ventricular systolic function is normal. There is moderately elevated pulmonary artery systolic pressure. The tricuspid regurgitant velocity is 2.61 m/s, and with an assumed right atrial pressure of 15 mmHg, the estimated right ventricular systolic pressure is [id number] mmHg. Left Atrium: Left atrial size was normal in size. Right Atrium: Right atrial size was normal in size. Pericardium: There is no evidence of pericardial effusion. Mitral Valve: The mitral valve is normal in structure and function. Trivial mitral valve regurgitation. No evidence of mitral  valve stenosis. Tricuspid Valve: The tricuspid valve is normal in structure. Tricuspid valve regurgitation is trivial. Aortic Valve: The aortic valve is tricuspid. Aortic valve regurgitation is not visualized. No aortic stenosis is present. Pulmonic Valve: The pulmonic valve was normal in structure. Pulmonic valve regurgitation is not visualized. Aorta: The aortic root is normal in size and structure and aortic dilatation noted. There is mild dilatation of the ascending aorta measuring 40 mm. Venous: The inferior vena cava is dilated in size with less than 50% respiratory variability, suggesting right atrial pressure of 15 mmHg. IAS/Shunts: No atrial level shunt detected by color flow Doppler.  LEFT VENTRICLE PLAX 2D LVIDd:         4.66 cm LVIDs:         3.76 cm LV PW:         0.99 cm LV IVS:        1.06 cm LVOT diam:     2.00 cm LV SV:         52 LV SV Index:   29 LVOT Area:     3.14 cm  RIGHT VENTRICLE             IVC RV Basal diam:  3.55 cm     IVC diam: 2.41 cm RV Mid diam:    2.48 cm RV S prime:     11.00 cm/s TAPSE (M-mode): 1.6 cm LEFT ATRIUM             Index       RIGHT ATRIUM           Index LA diam:        2.80 cm 1.59 cm/m  RA Area:     12.30 cm LA Vol (A2C):   35.4 ml 20.06 ml/m RA Volume:   26.40 ml  14.96 ml/m LA Vol (A4C):   37.2 ml 21.08 ml/m LA Biplane Vol: 38.7 ml 21.93 ml/m  AORTIC VALVE LVOT Vmax:   84.70 cm/s LVOT Vmean:  59.860 cm/s LVOT VTI:    0.164 m  AORTA Ao Root diam: 3.40 cm Ao Asc diam:  4.00 cm TRICUSPID VALVE TR Peak grad:   27.2 mmHg TR Vmax:        261.00 cm/s  SHUNTS Systemic VTI:  0.16 m Systemic Diam: 2.00 cm Loralie Champagne MD Electronically signed by Loralie Champagne MD Signature Date/Time: 04/05/2019/9:44:03 PM    Final     Assessment: 86 year old with bilateral vision loss that happened a few days ago, with concern for giant cell arteritis based on otological exam and MRI imaging. 1) MRI showed perineural vascular enhancement which can sbe seen with GCA 2) started on  eliquis for a. Fi. Was seen by cardiology who found no evidence of a. Fib. Does not need  anticoagulation 3) temporal artery biopsy is scheduled for today.   differentials for his presentation are as follows: -GCA remains in the differential but will need verification by biopsy -Needs whole brain imaging to look for any evidence of carcinomatous meningitis or mets. -Other causes of visual loss with basal meningitis that would include neurosarcoid.   Recommendations: -continue 3 days of IV Solu-Medrol 1 g. -Prior to committing of long-term steroids, would recommend bilateral temporal artery biopsy given that his clinical presentation is not completely congruent with GCA. -Lumbar puncture: today or tomorrow pending labs.   Plan on ordering glucose, protein, cell count, differential, Gram stain, cytology, flow cytometry.  Laurey Morale, MSN, NP-C [facility] [phone]   04/07/2019, 9:59 AM   No significant improvement in vision despite steroids.  Obtain LP today we will follow up on results sent flow cytometry and cytology.

## 2019-04-07 NOTE — Op Note (Signed)
   Operative Note   Date: 04/07/2019  Procedure: bilateral temporal lartery biopsies  Pre-op diagnosis: bilateral vision loss Post-op diagnosis: same  Indication and clinical history: The patient is a 86 y.o. year old male with bilateral vision loss with giant cell arteritis in the differential diagnosis.     Surgeon: Jesusita Oka, MD Assistant: Ileene Rubens, [place]  Anesthesia: General  Findings:  . Specimen: left and right temporal arteries . EBL: <5cc . Drains/Implants: none  Disposition: PACU - hemodynamically stable.  Description of procedure: The patient was positioned supine on the operating room table. MAC anesthetic induction was uneventful. The patient was prepped and draped in the usual sterile fashion. Time-out was performed verifying correct patient, procedure, signature of informed consent, and administration of pre-operative antibiotics.  A Doppler was used to insonate the temporal artery, starting on the right. A 3cm incision was made over the right temporal artery and deepened until the artery was reached. The right temporal artery was ligated at each end and the intervening segment resected with orientation of the specimen with a clip at the inferior aspect of the vessel and a long suture at the superior aspect of the vessel.  The incision was closed in layers and 4-0 Monocryl used for the skin with Steri-Strips as dressing. The procedure was replicated on the left side, again using a Doppler to insonate the temporal artery.  A 3cm incision was made over the left temporal artery and deepened until the left artery was reached.  The left temporal artery was ligated at each end and the intervening segment resected with an orientation of the specimen with a clip at the inferior aspect of the vessel and a long suture at the superior aspect of the vessel.  This incision was also closed in layers with a 4 Monocryl used for the skin and Steri-Strips as dressing.  The patient tolerated the  procedure well. All sponge and instrument counts were correct at the conclusion of the procedure. The patient was transported to the PACU in good condition. There were no complications.   The patient's wife was notified via phone post procedure.  Jesusita Oka, MD General and [facility]

## 2019-04-07 NOTE — Progress Notes (Signed)
PROGRESS NOTE  Matthew Hickman [id number] DOB: [birth year] DOA: 04/04/2019 PCP: [name], MD  HPI/Recap of past 24 hours:  Plan for LP and temporal artery biopsy today Reports has been having headache since   Melanoma surgery in 01/2019, headache has resolved since started on steroid in the hospital , vision remains blurry, left eye worse than right eye. No change in the last few days  Assessment/Plan: Principal Problem:   Rapid atrial fibrillation (HCC) Active Problems:   Giant cell arteritis (HCC)   Benign essential HTN   Protein-calorie malnutrition, severe   Giant cell arteritis/bilateral optic neuritis Esr 73/crp 13 on presentation He is started on IV Solu-Medrol 1g daily, ESR CRP trending down, headache resolved, vision remains blurry Case discussed with patient ophthalmology Dr Satira Sark who recommended neurology consult Neurology formally consulted, will follow recommendation General surgery consulted for bilateral temporal artery biopsy  frequent PACs Cardiology consulted who does not think patient has A. Fib, Eliquis discontinued, recommend Cardizem and beta-blocker He denies chest pain, no dizziness, no short of breath  cardiology ihas signed off, recommend outpatient follow up.   Impaired fasting blood glucose Likely due to steroid  A1c 5   Normocytic anemia hgb10.6 No sign of bleeding retic inappropriately ow Check FOBT  Hypertension: Stable on current medication  FTT: Underweight, Severe malnutrition in context of chronic illness Body mass index is 17.48 kg/m. Nutrition Status: Nutrition Problem: Severe Malnutrition Etiology: chronic illness(melanoma) Signs/Symptoms: energy intake < or equal to 75% for > or equal to 1 month, severe fat depletion, severe muscle depletion Interventions: Ensure Enlive (each supplement provides 350kcal and 20 grams of protein), MVI, Magic cup Nutrition input appreciated    DVT Prophylaxis: scd, need temporal artery  biopsy, need LP  Code Status: full  Family Communication: daughter [name] over the phone at [phone]  Disposition Plan:    Patient came from:          home                                                                                                Anticipated d/c place:  home with home health , likely early next week  Barriers to d/c OR conditions which need to be met to effect a safe d/c:  Need to bilateral temporal artery biopsy, needs neurology clearancet, needs symptom improvement, not ready to discharge   Consultants:  Cardiology  Ophthalmology Dr Satira Sark over the phone  Neurology Dr Aroor  General surgery  Procedures:  Bilateral temporal artery biopsy on March 5  Antibiotics:  None   Objective: BP 129/63 (BP Location: Right Arm)   Pulse 84   Temp 97.6 F (36.4 C) (Oral)   Resp 18   Ht 6' (1.829 m)   Wt 58.5 kg   SpO2 97%   BMI 17.48 kg/m   Intake/Output Summary (Last 24 hours) at 04/07/2019 1001 Last data filed at 04/07/2019 0917 Gross per 24 hour  Intake 555.03 ml  Output 665 ml  Net -109.97 ml   Filed Weights   04/05/19 0100 04/06/19 0414 04/07/19 0303  Weight:  58.2 kg 58.2 kg 58.5 kg    Exam: Patient is examined daily including today on 04/07/2019, exams remain the same as of yesterday except that has changed    General:  NAD, hard of hearing, impaired vision (left worse than the right)  Cardiovascular: RRR  Respiratory: CTABL  Abdomen: Soft/ND/NT, positive BS  Musculoskeletal: trace bilateral ankle pitting edema, chronic venous stasis changes  Neuro: alert, oriented x3  Data Reviewed: Basic Metabolic Panel: Recent Labs  Lab 04/01/19 1110 04/04/19 1735 04/06/19 0231 04/07/19 0452  NA 138 135 138 140  K 3.9 3.9 4.2 4.0  CL 98 96* 96* 96*  CO2 29 28 29 31   GLUCOSE 113* 122* 174* 161*  BUN 23 23 33* 42*  CREATININE 0.82 0.78 1.04 0.94  CALCIUM 8.9 8.8* 9.3 9.2  MG  --   --  1.9  --    Liver Function Tests: Recent  Labs  Lab 04/04/19 1735  AST 30  ALT 22  ALKPHOS 117  BILITOT 0.9  PROT 6.9  ALBUMIN 2.7*   No results for input(s): LIPASE, AMYLASE in the last 168 hours. No results for input(s): AMMONIA in the last 168 hours. CBC: Recent Labs  Lab 04/01/19 1110 04/04/19 1735 04/06/19 0231 04/07/19 0452  WBC 7.4 8.0 4.8 4.7  NEUTROABS 5.0  --  3.9 4.2  HGB 11.1* 11.4* 10.6* 10.3*  HCT 34.2* 34.8* 32.1* 31.8*  MCV 97.2 96.4 94.7 96.1  PLT 221 259 256 249   Cardiac Enzymes:   No results for input(s): CKTOTAL, CKMB, CKMBINDEX, TROPONINI in the last 168 hours. BNP (last 3 results) No results for input(s): BNP in the last 8760 hours.  ProBNP (last 3 results) No results for input(s): PROBNP in the last 8760 hours.  CBG: No results for input(s): GLUCAP in the last 168 hours.  Recent Results (from the past 240 hour(s))  Urine culture     Status: None   Collection Time: 04/01/19  1:30 PM   Specimen: Urine, Random  Result Value Ref Range Status   Specimen Description URINE, RANDOM  Final   Special Requests NONE  Final   Culture   Final    NO GROWTH Performed at [facility], [address] [place],  [address]    Report Status 04/02/2019 FINAL  Final  SARS CORONAVIRUS 2 (TAT 6-24 HRS) Nasopharyngeal Nasopharyngeal Swab     Status: None   Collection Time: 04/04/19  7:23 PM   Specimen: Nasopharyngeal Swab  Result Value Ref Range Status   SARS Coronavirus 2 NEGATIVE NEGATIVE Final    Comment: (NOTE) SARS-CoV-2 target nucleic acids are NOT DETECTED. The SARS-CoV-2 RNA is generally detectable in upper and lower respiratory specimens during the acute phase of infection. Negative results do not preclude SARS-CoV-2 infection, do not rule out co-infections with other pathogens, and should not be used as the sole basis for treatment or other patient management decisions. Negative results must be combined with clinical observations, patient history, and epidemiological  information. The expected result is Negative. Fact Sheet for Patients: SugarRoll.be Fact Sheet for Healthcare Providers: https://www.woods-mathews.com/ This test is not yet approved or cleared by the [place] FDA and  has been authorized for detection and/or diagnosis of SARS-CoV-2 by FDA under an Emergency Use Authorization (EUA). This EUA will remain  in effect (meaning this test can be used) for the duration of the COVID-19 declaration under Section 56 4(b)(1) of the Act, 21 U.S.C. section 360bbb-3(b)(1), unless the authorization is terminated  or revoked sooner. Performed at [facility], [place] [address] [place], [place] [address]   Surgical pcr screen     Status: Abnormal   Collection Time: 04/07/19  1:03 AM   Specimen: Nasal Mucosa; Nasal Swab  Result Value Ref Range Status   MRSA, PCR NEGATIVE NEGATIVE Final   Staphylococcus aureus POSITIVE (A) NEGATIVE Final    Comment: (NOTE) The Xpert SA Assay (FDA approved for NASAL specimens in patients 50 years of age and older), is one component of a comprehensive surveillance program. It is not intended to diagnose infection nor to guide or monitor treatment. Performed at [facility], [place] [address] [place], [place] [address]      Studies: MR BRAIN W WO CONTRAST  Result Date: 04/07/2019 CLINICAL DATA:  Binocular vision loss. Recent diagnosis of melanoma. Suspected arteritis. EXAM: MRI HEAD WITHOUT AND WITH CONTRAST TECHNIQUE: Multiplanar, multiecho pulse sequences of the brain and surrounding structures were obtained without and with intravenous contrast. CONTRAST:  5.13m GADAVIST GADOBUTROL 1 MMOL/ML IV SOLN COMPARISON:  MR orbits without and with contrast 04/04/2019 FINDINGS: Brain: Mild diffuse dural enhancement remains. Focal dural enhancement over the right frontal lobe measures 9 x 4 x 13 mm, consistent with a small meningioma. A second meningioma is present over the  right parietal lobe measuring 13 x 8 x 11 mm. Enhancement along the optic nerve is again noted. Assessment distal enhancement in the orbit is limited without fat saturated images. T1 shortening is again seen within the basal ganglia bilaterally. No acute infarct, hemorrhage, or mass lesion is present. Atrophy is within normal limits for age. The internal auditory canals are within normal limits. The brainstem and cerebellum are within normal limits. Vascular: Flow is present in the major intracranial arteries. Skull and upper cervical spine: The craniocervical junction is normal. Upper cervical spine is within normal limits. Marrow signal is unremarkable. Sinuses/Orbits: Polyp or mucous retention cyst is noted laterally within the left maxillary sinus. Remaining paranasal sinuses are clear. There is some fluid in the mastoid air cells bilaterally. Proximal optic nerve sheath enhancement is again noted. Bilateral lens replacements are present. Globes and orbits are otherwise within limits. IMPRESSION: 1. Diffuse dural enhancement is stable to slightly decreased. 2. Stable appearance of the optic nerve sheath enhancement. This is slightly improved, but remains concerning for arteritis. 3. Focal dural thickening over the right frontal lobe is most consistent with a meningioma. A second meningioma is present over the right parietal lobe. 4. Stable T1 shortening within the basal ganglia bilaterally. Question hepatic disease. 5. Polyp or mucous retention cyst within the left maxillary sinus. 6. Minimal fluid in the mastoid air cells bilaterally. No obstructing nasopharyngeal lesion is evident. Electronically Signed   By: CSan MorelleM.D.   On: 04/07/2019 06:34    Scheduled Meds: . diltiazem  180 mg Oral Daily  . feeding supplement (ENSURE ENLIVE)  237 mL Oral TID BM  . metoprolol tartrate  25 mg Oral BID  . multivitamin with minerals  1 tablet Oral Daily  . senna-docusate  1 tablet Oral BID     Continuous Infusions: . sodium chloride 250 mL (04/06/19 1519)  . diltiazem (CARDIZEM) infusion Stopped (04/05/19 0106)  . methylPREDNISolone (SOLU-MEDROL) injection 1,000 mg (04/06/19 1519)     Time spent: 320ms I have personally reviewed and interpreted on  04/07/2019 daily labs, imagings as discussed above under date review session and assessment and plans.  I reviewed all nursing notes, pharmacy notes, consultant  notes,  vitals, pertinent old records  I have discussed plan of care as described above with RN , patient and family on 04/07/2019   Florencia Reasons MD, PhD, FACP  [facility]  Available via Epic secure chat 7am-7pm for nonurgent issues Please page for urgent issues, pager number available through [email or web address] .   04/07/2019, 10:01 AM  LOS: 3 days

## 2019-04-07 NOTE — Progress Notes (Addendum)
Paged Xu at 1235.  Patient ok to travel to OR and back without tele monitor? or travel with tele? Cardiac monitoring orders discontinued.   Called report to [facility] in short stay at 1237. Patient left unit for OR at 1253.   Received report from [employer] in PACU at 1622. No sedation, no narcotics, no complaints of pain during procedure.   Patient return from OR at 1632, A&Ox4. No complaints of pain. Steri strips bilateral temple with minimal drainage.   Spoke with neuro NP Laurey Morale approx 1700;  Informed by NP will attempt to do LP this evening and ok for patient to eat. NP will bring LP tray; informed NP to notify RN if NP unable to obtain tray as it must be ordered.   1727 spoke with dietary confirmed dinner tray en route.   Messaged pharm at 1959 to request adjust administration time for IVPB Solumedrol originally scheduled for 1600 (patient in OR). IV access lost upon return to unit; delaying IV administration.

## 2019-04-07 NOTE — Transfer of Care (Signed)
Immediate Anesthesia Transfer of Care Note  Patient: Matthew Hickman  Procedure(s) Performed: BIOPSY TEMPORAL ARTERY (Bilateral )  Patient Location: PACU  Anesthesia Type:MAC  Level of Consciousness: awake, alert , oriented and patient cooperative  Airway & Oxygen Therapy: Patient Spontanous Breathing  Post-op Assessment: Report given to RN and Post -op Vital signs reviewed and stable  Post vital signs: Reviewed and stable  Last Vitals:  Vitals Value Taken Time  BP 102/44 04/07/19 1607  Temp    Pulse 56 04/07/19 1607  Resp 20 04/07/19 1607  SpO2 97 % 04/07/19 1607  Vitals shown include unvalidated device data.  Last Pain:  Vitals:   04/07/19 1104  TempSrc: Oral  PainSc:          Complications: No apparent anesthesia complications

## 2019-04-07 NOTE — Anesthesia Preprocedure Evaluation (Addendum)
Anesthesia Evaluation  Patient identified by MRN, date of birth, ID band Patient awake    Reviewed: Allergy & Precautions, NPO status , Patient's Chart, lab work & pertinent test results, reviewed documented beta blocker date and time   History of Anesthesia Complications Negative for: history of anesthetic complications  Airway Mallampati: II  TM Distance: >3 FB Neck ROM: Full    Dental  (+) Poor Dentition, Loose, Missing, Dental Advisory Given, Chipped   Pulmonary neg pulmonary ROS,    breath sounds clear to auscultation       Cardiovascular hypertension, Pt. on medications and Pt. on home beta blockers (-) angina+ dysrhythmias Atrial Fibrillation  Rhythm:Irregular Rate:Normal  04/05/2019 ECHO: EF 55-60%, grade 1 DD, valves OK   Neuro/Psych Blurry vision, concern for giant cell arteritis  negative psych ROS   GI/Hepatic negative GI ROS, Neg liver ROS,   Endo/Other  negative endocrine ROS  Renal/GU negative Renal ROS  negative genitourinary   Musculoskeletal negative musculoskeletal ROS (+)   Abdominal   Peds  Hematology negative hematology ROS (+)   Anesthesia Other Findings   Reproductive/Obstetrics negative OB ROS                           Anesthesia Physical Anesthesia Plan  ASA: III  Anesthesia Plan: MAC   Post-op Pain Management:    Induction:   PONV Risk Score and Plan: 1 and Propofol infusion, TIVA and Treatment may vary due to age or medical condition  Airway Management Planned: Natural Airway and Nasal Cannula  Additional Equipment: None  Intra-op Plan:   Post-operative Plan:   Informed Consent: I have reviewed the patients History and Physical, chart, labs and discussed the procedure including the risks, benefits and alternatives for the proposed anesthesia with the patient or authorized representative who has indicated his/her understanding and acceptance.      Dental advisory given  Plan Discussed with: CRNA  Anesthesia Plan Comments:       Anesthesia Quick Evaluation

## 2019-04-07 NOTE — Discharge Instructions (Signed)
Temporal Artery Biopsy, Care After This sheet gives you information about how to care for yourself after your procedure. Your health care provider may also give you more specific instructions. If you have problems or questions, contact your health care provider. What can I expect after the procedure? After the procedure, it is common to have:  Soreness.  Bruising.  Numbness.  Swelling. Follow these instructions at home: Incision care   Follow instructions from your health care provider about how to take care of your incision. Make sure you: ? Wash your hands with soap and water before and after you change your bandage (dressing). If soap and water are not available, use hand sanitizer. ? Change your dressing as told by your health care provider. ? Leave stitches (sutures), skin glue, or adhesive strips in place. These skin closures may need to stay in place for 2 weeks or longer. If adhesive strip edges start to loosen and curl up, you may trim the loose edges. Do not remove adhesive strips completely unless your health care provider tells you to do that.  Check your incision area every day for signs of infection. Check for: ? Redness, swelling, or pain. ? Fluid or blood. ? Warmth. ? Pus or a bad smell. Activity  Do not do any physical work or strenuous exercise until your health care provider approves.  Do not lift anything that is heavier than 10 lb (4.5 kg), or the limit that you are told, until your health care provider says that it is safe. General instructions   Take over-the-counter and prescription medicines only as told by your health care provider. Do not take aspirin or other NSAIDs unless your health care provider tells you to take them. Aspirin may increase your risk of bleeding at the incision site.  Do not drive for 24 hours if you were given a sedative during your procedure.  Follow your health care provider's instructions on bathing or showering.  Keep all  follow-up visits as told by your health care provider. This is important. Contact a health care provider if:  Medicine does not help your pain.  You have chills.  You have redness, swelling, or pain around your incision site.  You have fluid or blood coming from your incision site.  Your incision feels warm to the touch.  You have pus or a bad smell coming from your incision site.  You have a fever.  You have nausea or vomiting. Get help right away if:  You have bleeding from the incision that does not stop after 30 minutes of applying heavy pressure.  You have chest pain.  You have shortness of breath.  You faint.  You have sudden vision loss.  You have weakness or drooping in your face or eye. Summary  Follow instructions from your health care provider about how to take care of your incision.  Do not do any physical work or strenuous exercise until your health care provider approves.  Take over-the-counter and prescription medicines only as told by your health care provider.  Contact a health care provider if you have signs of infection at your incision.  Keep all follow-up visits as told by your health care provider. This is important. This information is not intended to replace advice given to you by your health care provider. Make sure you discuss any questions you have with your health care provider. Document Revised: 10/13/2017 Document Reviewed: 10/13/2017 Elsevier Patient Education  [place].

## 2019-04-08 LAB — CBC WITH DIFFERENTIAL/PLATELET
Abs Immature Granulocytes: 0.05 10*3/uL (ref 0.00–0.07)
Basophils Absolute: 0 10*3/uL (ref 0.0–0.1)
Basophils Relative: 0 %
Eosinophils Absolute: 0 10*3/uL (ref 0.0–0.5)
Eosinophils Relative: 0 %
HCT: 32.7 % — ABNORMAL LOW (ref 39.0–52.0)
Hemoglobin: 10.6 g/dL — ABNORMAL LOW (ref 13.0–17.0)
Immature Granulocytes: 1 %
Lymphocytes Relative: 8 %
Lymphs Abs: 0.4 10*3/uL — ABNORMAL LOW (ref 0.7–4.0)
MCH: 30.8 pg (ref 26.0–34.0)
MCHC: 32.4 g/dL (ref 30.0–36.0)
MCV: 95.1 fL (ref 80.0–100.0)
Monocytes Absolute: 0.1 10*3/uL (ref 0.1–1.0)
Monocytes Relative: 2 %
Neutro Abs: 4 10*3/uL (ref 1.7–7.7)
Neutrophils Relative %: 89 %
Platelets: 238 10*3/uL (ref 150–400)
RBC: 3.44 MIL/uL — ABNORMAL LOW (ref 4.22–5.81)
RDW: 13.6 % (ref 11.5–15.5)
WBC: 4.6 10*3/uL (ref 4.0–10.5)
nRBC: 0 % (ref 0.0–0.2)

## 2019-04-08 LAB — BASIC METABOLIC PANEL
Anion gap: 12 (ref 5–15)
BUN: 49 mg/dL — ABNORMAL HIGH (ref 8–23)
CO2: 30 mmol/L (ref 22–32)
Calcium: 9 mg/dL (ref 8.9–10.3)
Chloride: 100 mmol/L (ref 98–111)
Creatinine, Ser: 0.98 mg/dL (ref 0.61–1.24)
GFR calc Af Amer: >60 mL/min (ref >60)
GFR calc non Af Amer: >60 mL/min (ref >60)
Glucose, Bld: 166 mg/dL — ABNORMAL HIGH (ref 70–99)
Potassium: 4.1 mmol/L (ref 3.5–5.1)
Sodium: 142 mmol/L (ref 135–145)

## 2019-04-08 LAB — CSF CELL COUNT WITH DIFFERENTIAL
RBC Count, CSF: 0 /mm3
Tube #: 3
WBC, CSF: 3 /mm3 (ref 0–5)

## 2019-04-08 LAB — PROTEIN AND GLUCOSE, CSF
Glucose, CSF: 99 mg/dL — ABNORMAL HIGH (ref 40–70)
Total  Protein, CSF: 56 mg/dL — ABNORMAL HIGH (ref 15–45)

## 2019-04-08 LAB — C-REACTIVE PROTEIN: CRP: 1.8 mg/dL — ABNORMAL HIGH (ref <1.0)

## 2019-04-08 LAB — MAGNESIUM: Magnesium: 2.4 mg/dL (ref 1.7–2.4)

## 2019-04-08 LAB — SEDIMENTATION RATE: Sed Rate: 31 mm/hr — ABNORMAL HIGH (ref 0–16)

## 2019-04-08 LAB — RHEUMATOID FACTOR: Rheumatoid fact SerPl-aCnc: 10.9 IU/mL (ref 0.0–13.9)

## 2019-04-08 MED ORDER — LIDOCAINE HCL 1 % IJ SOLN
5.0000 mL | Freq: Once | INTRAMUSCULAR | Status: DC
  Filled 2019-04-08 (×2): qty 5

## 2019-04-08 MED ORDER — POLYETHYLENE GLYCOL 3350 17 G PO PACK
17.0000 g | PACK | Freq: Every day | ORAL | Status: DC
  Administered 2019-04-08 – 2019-04-10 (×3): 17 g via ORAL
  Filled 2019-04-08 (×3): qty 1

## 2019-04-08 NOTE — Progress Notes (Signed)
Patient ID: Matthew Hickman, male   DOB: 29-Oct-1925, 86 y.o.   MRN: [id number]  Both temporal artery biopsy incisions intact with no drainage.  Steri-strips are starting to peel already.  These incisions can get wet beginning 3/8.  Do not scrub over these areas.  Will sign off.  Primary team can follow-up on biopsy results.  No followup needed.  Call us if any wound problems.  Imogene Burn. Georgette Dover, MD, [facility]  General/ Trauma Surgery   04/08/2019 7:32 AM

## 2019-04-08 NOTE — Procedures (Signed)
Risk of the procedure were discussed with the patient including post- LP Headache, bleeding, infection, weakness/numbness of legds ( radiculopathy), death. The patient agreed and written consent was obtained.   The patient was prepped and draped. Using sterile technique a 20 gauge quinke spinal needle was inserted in the L4-L5  space  With 2 attempts The opening pressure was not obtained. cm  Approximately  13cc of  Clear CSF was obtained and sent for analysis.    Dr. Lorraine Lax was present at time of LP  Laurey Morale, MSN, NP-C [facility] [phone]

## 2019-04-08 NOTE — Progress Notes (Signed)
PROGRESS NOTE  REMIJIO HOLLERAN UEK:[id number] DOB: 16-Jan-1926 DOA: 04/04/2019 PCP: [name], MD  HPI/Recap of past 24 hours:  Some superficial bright color blood on surface of stool ,reports straining and no bm for the last few days, denies ab pain  Denies headache,    vision remains blurry, left eye worse than right eye. No change in the last few days  Assessment/Plan: Principal Problem:   Rapid atrial fibrillation (HCC) Active Problems:   Giant cell arteritis (HCC)   Benign essential HTN   Protein-calorie malnutrition, severe   Giant cell arteritis/bilateral optic neuritis -Reports has been having headache since   Melanoma surgery in 01/2019, headache has resolved since started on steroid in the hospital  --Esr 73/crp 13 on presentation -He is started on IV Solu-Medrol 1g daily, ESR CRP trending down, headache resolved, vision remains blurry -Case discussed with patient ophthalmology Dr Satira Sark who recommended neurology consult, details please refer notes by Dr. Satira Sark on March 5 -Neurology formally consulted, will follow recommendation - s/p bilateral temporal artery biopsy on 3/5 - s/p LP on 3/6  frequent PACs Cardiology consulted who does not think patient has A. Fib, Eliquis discontinued, recommend Cardizem and beta-blocker He denies chest pain, no dizziness, no short of breath  cardiology ihas signed off, recommend outpatient follow up.   Impaired fasting blood glucose Likely due to steroid  A1c 5   Normocytic anemia hgb10.6 retic inappropriately low Superficial blood on surface of stool observed on March 6, suspect hemorrhoids Monitor   Hypertension: Stable on current medication  FTT: Underweight, Severe malnutrition in context of chronic illness Body mass index is 17.37 kg/m. Nutrition Status: Nutrition Problem: Severe Malnutrition Etiology: chronic illness(melanoma) Signs/Symptoms: energy intake < or equal to 75% for > or equal to 1 month, severe fat  depletion, severe muscle depletion Interventions: Ensure Enlive (each supplement provides 350kcal and 20 grams of protein), MVI, Magic cup Nutrition input appreciated    DVT Prophylaxis: scd  Code Status: full  Family Communication: daughter [name] over the phone at [phone] on 3/5,  Patient declined my offer to call her daughter today on 3/6  Disposition Plan:    Patient came from:          home                                                                                                Anticipated d/c place:  home with home health , likely early next week  Barriers to d/c OR conditions which need to be met to effect a safe d/c:  Awaiting for bilateral temporal artery biopsy pathology report, needs neurology clearance, needs symptom improvement   Consultants:  Cardiology  Ophthalmology Dr Satira Sark over the phone  Neurology Dr Aroor  General surgery  Procedures:  Bilateral temporal artery biopsy on March 5  LP on 3/6 by neurology  Antibiotics:  None   Objective: BP (!) 110/55 (BP Location: Right Arm)   Pulse 65   Temp 97.8 F (36.6 C) (Oral)   Resp 18   Ht 6' (1.829 m)   Wt 58.1  kg   SpO2 100%   BMI 17.37 kg/m   Intake/Output Summary (Last 24 hours) at 04/08/2019 1503 Last data filed at 04/08/2019 1457 Gross per 24 hour  Intake 880 ml  Output 1186 ml  Net -306 ml   Filed Weights   04/07/19 0303 04/07/19 1303 04/08/19 0354  Weight: 58.5 kg 58.5 kg 58.1 kg    Exam: Patient is examined daily including today on 04/08/2019, exams remain the same as of yesterday except that has changed    General:  NAD, hard of hearing, impaired vision (left worse than the right)  Cardiovascular: RRR  Respiratory: CTABL  Abdomen: Soft/ND/NT, positive BS  Musculoskeletal: trace bilateral ankle pitting edema presented down admission, now resolved, chronic venous stasis changes  Neuro: alert, oriented x3  Data Reviewed: Basic Metabolic Panel: Recent Labs   Lab 04/04/19 1735 04/06/19 0231 04/07/19 0452 04/08/19 0439  NA 135 138 140 142  K 3.9 4.2 4.0 4.1  CL 96* 96* 96* 100  CO2 28 29 31 30   GLUCOSE 122* 174* 161* 166*  BUN 23 33* 42* 49*  CREATININE 0.78 1.04 0.94 0.98  CALCIUM 8.8* 9.3 9.2 9.0  MG  --  1.9  --  2.4   Liver Function Tests: Recent Labs  Lab 04/04/19 1735  AST 30  ALT 22  ALKPHOS 117  BILITOT 0.9  PROT 6.9  ALBUMIN 2.7*   No results for input(s): LIPASE, AMYLASE in the last 168 hours. No results for input(s): AMMONIA in the last 168 hours. CBC: Recent Labs  Lab 04/04/19 1735 04/06/19 0231 04/07/19 0452 04/08/19 0439  WBC 8.0 4.8 4.7 4.6  NEUTROABS  --  3.9 4.2 4.0  HGB 11.4* 10.6* 10.3* 10.6*  HCT 34.8* 32.1* 31.8* 32.7*  MCV 96.4 94.7 96.1 95.1  PLT 259 256 249 238   Cardiac Enzymes:   No results for input(s): CKTOTAL, CKMB, CKMBINDEX, TROPONINI in the last 168 hours. BNP (last 3 results) No results for input(s): BNP in the last 8760 hours.  ProBNP (last 3 results) No results for input(s): PROBNP in the last 8760 hours.  CBG: No results for input(s): GLUCAP in the last 168 hours.  Recent Results (from the past 240 hour(s))  Urine culture     Status: None   Collection Time: 04/01/19  1:30 PM   Specimen: Urine, Random  Result Value Ref Range Status   Specimen Description URINE, RANDOM  Final   Special Requests NONE  Final   Culture   Final    NO GROWTH Performed at [facility], [address] [place], [place] [address]    Report Status 04/02/2019 FINAL  Final  SARS CORONAVIRUS 2 (TAT 6-24 HRS) Nasopharyngeal Nasopharyngeal Swab     Status: None   Collection Time: 04/04/19  7:23 PM   Specimen: Nasopharyngeal Swab  Result Value Ref Range Status   SARS Coronavirus 2 NEGATIVE NEGATIVE Final    Comment: (NOTE) SARS-CoV-2 target nucleic acids are NOT DETECTED. The SARS-CoV-2 RNA is generally detectable in upper and lower respiratory specimens during the acute phase of infection.  Negative results do not preclude SARS-CoV-2 infection, do not rule out co-infections with other pathogens, and should not be used as the sole basis for treatment or other patient management decisions. Negative results must be combined with clinical observations, patient history, and epidemiological information. The expected result is Negative. Fact Sheet for Patients: SugarRoll.be Fact Sheet for Healthcare Providers: https://www.woods-mathews.com/ This test is not yet approved or cleared by  the [employer] and  has been authorized for detection and/or diagnosis of SARS-CoV-2 by FDA under an Emergency Use Authorization (EUA). This EUA will remain  in effect (meaning this test can be used) for the duration of the COVID-19 declaration under Section 56 4(b)(1) of the Act, 21 U.S.C. section 360bbb-3(b)(1), unless the authorization is terminated or revoked sooner. Performed at [facility], [place] [address], [place], [place] [address]   Surgical pcr screen     Status: Abnormal   Collection Time: 04/07/19  1:03 AM   Specimen: Nasal Mucosa; Nasal Swab  Result Value Ref Range Status   MRSA, PCR NEGATIVE NEGATIVE Final   Staphylococcus aureus POSITIVE (A) NEGATIVE Final    Comment: (NOTE) The Xpert SA Assay (FDA approved for NASAL specimens in patients 15 years of age and older), is one component of a comprehensive surveillance program. It is not intended to diagnose infection nor to guide or monitor treatment. Performed at [facility], [place] [address], [place], [place] [address]   CSF culture with Stat gram stain     Status: None (Preliminary result)   Collection Time: 04/08/19 10:09 AM   Specimen: CSF; Cerebrospinal Fluid  Result Value Ref Range Status   Specimen Description CSF  Final   Special Requests NONE  Final   Gram Stain   Final    WBC PRESENT, PREDOMINANTLY MONONUCLEAR NO ORGANISMS SEEN CYTOSPIN SMEAR Performed at  [facility], [place] [address], [place], [place] [address]    Culture PENDING  Incomplete   Report Status PENDING  Incomplete     Studies: No results found.  Scheduled Meds: . diltiazem  180 mg Oral Daily  . feeding supplement (ENSURE ENLIVE)  237 mL Oral TID BM  . lidocaine  5 mL Infiltration Once  . metoprolol tartrate  25 mg Oral BID  . multivitamin with minerals  1 tablet Oral Daily  . polyethylene glycol  17 g Oral Daily  . senna-docusate  1 tablet Oral BID    Continuous Infusions: . sodium chloride 250 mL (04/06/19 1519)  . sodium chloride 10 mL/hr at 04/07/19 2016  . diltiazem (CARDIZEM) infusion Stopped (04/05/19 0106)  . methylPREDNISolone (SOLU-MEDROL) injection 1,000 mg (04/07/19 2129)     Time spent: 38mns I have personally reviewed and interpreted on  04/08/2019 daily labs, imagings as discussed above under date review session and assessment and plans.  I reviewed all nursing notes, pharmacy notes, consultant notes,  vitals, pertinent old records  I have discussed plan of care as described above with RN , patient on 04/08/2019   FFlorencia ReasonsMD, PhD, FACP  [facility]  Available via Epic secure chat 7am-7pm for nonurgent issues Please page for urgent issues, pager number available through [email or web address] .   04/08/2019, 3:03 PM  LOS: 4 days

## 2019-04-08 NOTE — Progress Notes (Signed)
Charge RN Jasmina C. present at bedside during LP (began procedure at approx 1000).  Patient remained flat post LP.   Approx 1455 during toileting NT observed blood in stool. RN assessed; mild amount of frank blood on stool surface. No visible hemorrohoids observed. patient report no prior blood in stool and no history of hemorrhoids. No abdominal pain.  Paged Xu at [phone].Mild amt. frank blood on stool surface;no abdominal pain.per lab CD19 & CD20 cytometry only drawn Mon-Thur.upon callback at 1501 RN informed MD patient had not had BM in 5-6 days and normally goes every day. NT reports observing patient  straining during BM. MD will order Miralax.

## 2019-04-08 NOTE — Progress Notes (Addendum)
NEURO HOSPITALIST PROGRESS NOTE   Subjective: Patient in bed asleep, NAD. Easily arouses to name calling. LP completed. Patient states no improvement in vision. Color differentiation continued to be impaired.    Exam: Vitals:   04/08/19 0343 04/08/19 0917  BP: 126/76 119/69  Pulse: 78 65  Resp: 17 18  Temp: (!) 97.3 F (36.3 C) 98.1 F (36.7 C)  SpO2: 100%     Physical Exam  Constitutional: Appears well-developed and well-nourished.  Psych: Affect appropriate to situation Eyes: Normal external eye and conjunctiva. HENT: Normocephalic, no lesions, without obvious abnormality.   Musculoskeletal-no joint tenderness, deformity or swelling Cardiovascular: Normal rate and regular rhythm.  Respiratory: Effort normal, non-labored breathing saturations WNL GI: Soft.  No distension. There is no tenderness.  Skin: WDI   Neuro:  Mental Status: Alert, oriented, thought content appropriate.  Speech fluent without evidence of aphasia.  Able to follow commands without difficulty. 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  Gait: deferred    Medications:  Scheduled: . diltiazem  180 mg Oral Daily  . feeding supplement (ENSURE ENLIVE)  237 mL Oral TID BM  . lidocaine  5 mL  Infiltration Once  . metoprolol tartrate  25 mg Oral BID  . multivitamin with minerals  1 tablet Oral Daily  . senna-docusate  1 tablet Oral BID   Continuous: . sodium chloride 250 mL (04/06/19 1519)  . sodium chloride 10 mL/hr at 04/07/19 2016  . diltiazem (CARDIZEM) infusion Stopped (04/05/19 0106)  . methylPREDNISolone (SOLU-MEDROL) injection 1,000 mg (04/07/19 2129)   [id number] chloride, acetaminophen, ondansetron (ZOFRAN) IV  Pertinent Labs/Diagnostics: No new images or labs.  temporal artery biopsy  Results pending CSF: labs pending  MR BRAIN W WO CONTRAST  Result Date: 04/07/2019 CLINICAL DATA:  Binocular vision loss. Recent diagnosis of melanoma. Suspected arteritis. EXAM: MRI HEAD WITHOUT AND WITH CONTRAST TECHNIQUE: Multiplanar, multiecho pulse sequences of the brain and surrounding structures were obtained without and with intravenous contrast. CONTRAST:  5.18mL GADAVIST GADOBUTROL 1 MMOL/ML IV SOLN COMPARISON:  MR orbits without and with contrast 04/04/2019 FINDINGS: Brain: Mild diffuse dural enhancement remains. Focal dural enhancement over the right frontal lobe measures 9 x 4 x 13 mm, consistent with a small meningioma. A second meningioma is present over the right parietal lobe measuring 13 x 8 x 11 mm. Enhancement along the optic nerve is again noted. Assessment distal enhancement in the orbit is limited without fat saturated images. T1 shortening is again seen within the basal ganglia bilaterally. No acute infarct, hemorrhage, or mass lesion is present. Atrophy is within normal limits for age. The internal auditory canals are within normal limits. The brainstem and cerebellum are within normal limits. Vascular: Flow is present in the major  intracranial arteries. Skull and upper cervical spine: The craniocervical junction is normal. Upper cervical spine is within normal limits. Marrow signal is unremarkable. Sinuses/Orbits: Polyp or mucous retention cyst is noted laterally within  the left maxillary sinus. Remaining paranasal sinuses are clear. There is some fluid in the mastoid air cells bilaterally. Proximal optic nerve sheath enhancement is again noted. Bilateral lens replacements are present. Globes and orbits are otherwise within limits. IMPRESSION: 1. Diffuse dural enhancement is stable to slightly decreased. 2. Stable appearance of the optic nerve sheath enhancement. This is slightly improved, but remains concerning for arteritis. 3. Focal dural thickening over the right frontal lobe is most consistent with a meningioma. A second meningioma is present over the right parietal lobe. 4. Stable T1 shortening within the basal ganglia bilaterally. Question hepatic disease. 5. Polyp or mucous retention cyst within the left maxillary sinus. 6. Minimal fluid in the mastoid air cells bilaterally. No obstructing nasopharyngeal lesion is evident. Electronically Signed   By: San Morelle M.D.   On: 04/07/2019 06:34    Assessment: 86 year old with bilateral vision loss that happened a few days ago, with concern for giant cell arteritis based on otological exam and MRI imaging. 1) MRI showed perineural vascular enhancement which can be seen with GCA 2) started on eliquis for a. Fi. Was seen by cardiology who found no evidence of a. Fib. Does not need anticoagulation 3) temporal artery biopsy was done yesterday. LP completed 3/6 and sent for flow cytometry, cytology, cell count with differential, protein and glucose, culture and gram stain.   differentials for his presentation are as follows: -GCA remains in the differential but will need verification by biopsy -Needs whole brain imaging to look for any evidence of carcinomatous meningitis or mets. -Other causes of visual loss with basal meningitis that would include neurosarcoid.  Recommendations: -continue 3 days of IV Solu-Medrol 1 g.   Laurey Morale, MSN, NP-C [facility] [phone]      04/08/2019,  10:31 AM

## 2019-04-09 LAB — MISC LABCORP TEST (SEND OUT)

## 2019-04-09 LAB — BASIC METABOLIC PANEL
Anion gap: 10 (ref 5–15)
BUN: 49 mg/dL — ABNORMAL HIGH (ref 8–23)
CO2: 30 mmol/L (ref 22–32)
Calcium: 8.7 mg/dL — ABNORMAL LOW (ref 8.9–10.3)
Chloride: 102 mmol/L (ref 98–111)
Creatinine, Ser: 0.93 mg/dL (ref 0.61–1.24)
GFR calc Af Amer: >60 mL/min (ref >60)
GFR calc non Af Amer: >60 mL/min (ref >60)
Glucose, Bld: 198 mg/dL — ABNORMAL HIGH (ref 70–99)
Potassium: 4.5 mmol/L (ref 3.5–5.1)
Sodium: 142 mmol/L (ref 135–145)

## 2019-04-09 LAB — CBC
HCT: 31 % — ABNORMAL LOW (ref 39.0–52.0)
Hemoglobin: 10.2 g/dL — ABNORMAL LOW (ref 13.0–17.0)
MCH: 31 pg (ref 26.0–34.0)
MCHC: 32.9 g/dL (ref 30.0–36.0)
MCV: 94.2 fL (ref 80.0–100.0)
Platelets: 216 10*3/uL (ref 150–400)
RBC: 3.29 MIL/uL — ABNORMAL LOW (ref 4.22–5.81)
RDW: 13.4 % (ref 11.5–15.5)
WBC: 4 10*3/uL (ref 4.0–10.5)
nRBC: 0 % (ref 0.0–0.2)

## 2019-04-09 LAB — C-REACTIVE PROTEIN: CRP: 1.1 mg/dL — ABNORMAL HIGH (ref <1.0)

## 2019-04-09 LAB — SEDIMENTATION RATE: Sed Rate: 17 mm/hr — ABNORMAL HIGH (ref 0–16)

## 2019-04-09 MED ORDER — PREDNISONE 50 MG PO TABS
50.0000 mg | ORAL_TABLET | Freq: Every day | ORAL | Status: DC
  Administered 2019-04-09: 50 mg via ORAL
  Filled 2019-04-09: qty 1

## 2019-04-09 MED ORDER — PANTOPRAZOLE SODIUM 20 MG PO TBEC
20.0000 mg | DELAYED_RELEASE_TABLET | Freq: Every day | ORAL | Status: DC
  Administered 2019-04-09 – 2019-04-10 (×2): 20 mg via ORAL
  Filled 2019-04-09 (×2): qty 1

## 2019-04-09 MED ORDER — SODIUM CHLORIDE 0.9 % IV SOLN
1000.0000 mg | INTRAVENOUS | Status: AC
  Administered 2019-04-09 – 2019-04-10 (×2): 1000 mg via INTRAVENOUS
  Filled 2019-04-09 (×2): qty 8

## 2019-04-09 MED ORDER — DOXYCYCLINE HYCLATE 100 MG PO TABS
100.0000 mg | ORAL_TABLET | Freq: Two times a day (BID) | ORAL | Status: DC
  Administered 2019-04-09 – 2019-04-10 (×3): 100 mg via ORAL
  Filled 2019-04-09 (×3): qty 1

## 2019-04-09 NOTE — Plan of Care (Signed)

## 2019-04-09 NOTE — Progress Notes (Signed)
PROGRESS NOTE  MERREL CRABBE [id number] DOB: 1925-06-04 DOA: 04/04/2019 PCP: [name], MD  PCP: [name], MD   Outpatient Specialists: None  Patient coming from: Home  Chief Complaint: Abnormal vision  HPI: Matthew Hickman is a 86 y.o. male with medical history significant of melanoma, hypertension, recent diagnosis of atrial fibrillation, bilateral clubfoot surgery who presented with sudden onset of bilateral visual loss.  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  Patient came to the ER where he was seen and evaluated.  As part of his evaluation he was noted to have atrial fibrillation with rapid ventricular response.  His vision has slightly improved.  Neurology consulted by ER who recommended high-dose steroids.  Patient has also has increased headache with tenderness in his bilateral temporal areas.  He has had difficulty with with eating especially around chewing.  Symptoms have been going on for a few days.  At this point patient is admitted to the medical service and initiated on Cardizem drip..  ED Course: Temperature is 98.8 blood pressure 141/65 pulse 120 respirate 28 oxygen sats 97% on room air.  Urinalysis negative except for RBC of 21-50 and large hemoglobin.  Chest x-ray showed no acute findings.  CRP was 13 white count 8.1 hemoglobin 11.4.  Chemistry largely within normal except for albumin of 2.7.  COVID-19 is negative.  MRI of the orbits has been ordered.  Given a dose of Solu-Medrol and being admitted to the hospital for treatment.    HPI/Recap of past 24 hours:   Denies headache, no improvement in vision   vision remains blurry, left eye worse than right eye. 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 Satira Sark on March 5 -Neurology formally consulted, will follow recommendation - s/p bilateral temporal artery biopsy on 3/5, result pending - s/p LP on 3/6,  -case discussed with neurology who recommend continue iv solumedrol for now, bartonella serology test ordered by neurology, will start doxycycline (plan for 4-6 weeks treatment)  frequent PACs Cardiology consulted who does not think patient has A. Fib, Eliquis discontinued, recommend Cardizem and beta-blocker He denies chest pain, no dizziness, no short of breath  cardiology ihas signed off, recommend outpatient follow up.   Impaired fasting blood glucose Likely due to steroid  A1c 5   Normocytic anemia retic inappropriately low Superficial blood on surface of stool observed on March 6, suspect hemorrhoids hgb stable above 10, Monitor   Hypertension: Stable on current medication  FTT: Underweight, Severe malnutrition in context of chronic illness Body mass index is 17.2 kg/m. Nutrition Status: Nutrition Problem: Severe Malnutrition Etiology: chronic illness(melanoma) Signs/Symptoms: energy intake < or equal to 75% for >  or equal to 1 month, severe fat depletion, severe muscle depletion Interventions: Ensure Enlive (each supplement provides 350kcal and 20 grams of protein), MVI, Magic cup Nutrition  input appreciated    DVT Prophylaxis: scd  Code Status: full  Family Communication: daughter [name] over the phone at [phone] on 3/5,    Disposition Plan:    Patient came from:          home                                                                                                Anticipated d/c place:  home with home health , likely early next week  Barriers to d/c OR conditions which need to be met to effect a safe d/c:  Awaiting for bilateral temporal artery biopsy pathology report, needs neurology clearance, needs symptom improvement   Consultants:  Cardiology  Ophthalmology Dr Satira Sark over the phone  Neurology Dr Aroor  General surgery  Procedures:  Bilateral temporal artery biopsy on March 5  LP on 3/6 by neurology  Antibiotics:  doxcycline from 3/7   Objective: BP 113/68 (BP Location: Right Arm)   Pulse 77   Temp (!) 97.5 F (36.4 C) (Oral)   Resp 18   Ht 6' (1.829 m)   Wt 57.5 kg Comment: scale a  SpO2 96%   BMI 17.20 kg/m   Intake/Output Summary (Last 24 hours) at 04/09/2019 0727 Last data filed at 04/09/2019 0422 Gross per 24 hour  Intake 1276 ml  Output 1101 ml  Net 175 ml   Filed Weights   04/07/19 1303 04/08/19 0354 04/09/19 0420  Weight: 58.5 kg 58.1 kg 57.5 kg    Exam: Patient is examined daily including today on 04/09/2019, exams remain the same as of yesterday except that has changed    General:  NAD, hard of hearing, impaired vision (left worse than the right)  Cardiovascular: RRR  Respiratory: CTABL  Abdomen: Soft/ND/NT, positive BS  Musculoskeletal: trace bilateral ankle pitting edema presented down admission, now resolved, chronic venous stasis changes  Neuro: alert, oriented x3  Data Reviewed: Basic Metabolic Panel: Recent Labs  Lab 04/04/19 1735 04/06/19 0231 04/07/19 0452 04/08/19 0439 04/09/19 0337  NA 135 138 140 142 142  K 3.9 4.2 4.0 4.1 4.5  CL 96* 96* 96* 100 102  CO2 _0 GLUCOSE 122* 174* 161* 166* 198*  BUN 23 33* 42* 49* 49*  CREATININE 0.78 1.04 0.94 0.98 0.93  CALCIUM 8.8* 9.3 9.2 9.0 8.7*  MG  --  1.9  --  2.4  --    Liver Function Tests: Recent Labs  Lab 04/04/19 1735  AST 30  ALT 22  ALKPHOS 117  BILITOT 0.9  PROT 6.9  ALBUMIN 2.7*   No results for input(s): LIPASE, AMYLASE in the last 168 hours. No results for input(s): AMMONIA in the last 168 hours. CBC: Recent Labs  Lab 04/04/19 1735 04/06/19 0231 04/07/19 0452 04/08/19 0439 04/09/19 0337  WBC 8.0 4.8 4.7 4.6 4.0  NEUTROABS  --  3.9 4.2 4.0  --  HGB 11.4* 10.6* 10.3* 10.6* 10.2*  HCT 34.8* 32.1* 31.8* 32.7* 31.0*  MCV 96.4 94.7 96.1 95.1 94.2  PLT 259 256 249 238 216   Cardiac Enzymes:   No results for input(s): CKTOTAL, CKMB, CKMBINDEX, TROPONINI in the last 168 hours. BNP (last 3 results) No results for input(s): BNP in the last 8760 hours.  ProBNP (last 3 results) No results for input(s): PROBNP in the last 8760 hours.  CBG: No results for input(s): GLUCAP in the last 168 hours.  Recent Results (from the past 240 hour(s))  Urine culture     Status: None   Collection Time: 04/01/19  1:30 PM   Specimen: Urine, Random  Result Value Ref Range Status   Specimen Description URINE, RANDOM  Final   Special Requests NONE  Final   Culture   Final    NO GROWTH Performed at [facility], [address], [place], [place] [address]    Report Status 04/02/2019 FINAL  Final  SARS CORONAVIRUS 2 (TAT 6-24 HRS) Nasopharyngeal Nasopharyngeal Swab     Status: None   Collection Time: 04/04/19  7:23 PM   Specimen: Nasopharyngeal Swab  Result Value Ref Range Status   SARS Coronavirus 2 NEGATIVE NEGATIVE Final    Comment: (NOTE) SARS-CoV-2 target nucleic acids are NOT DETECTED. The SARS-CoV-2 RNA is generally detectable in upper and lower respiratory specimens during the acute phase of infection. Negative results do not preclude SARS-CoV-2 infection, do not rule  out co-infections with other pathogens, and should not be used as the sole basis for treatment or other patient management decisions. Negative results must be combined with clinical observations, patient history, and epidemiological information. The expected result is Negative. Fact Sheet for Patients: SugarRoll.be Fact Sheet for Healthcare Providers: https://www.woods-mathews.com/ This test is not yet approved or cleared by the [place] FDA and  has been authorized for detection and/or diagnosis of SARS-CoV-2 by FDA under an Emergency Use Authorization (EUA). This EUA will remain  in effect (meaning this test can be used) for the duration of the COVID-19 declaration under Section 56 4(b)(1) of the Act, 21 U.S.C. section 360bbb-3(b)(1), unless the authorization is terminated or revoked sooner. Performed at [facility], [place] [address] [place], [place] [address]   Surgical pcr screen     Status: Abnormal   Collection Time: 04/07/19  1:03 AM   Specimen: Nasal Mucosa; Nasal Swab  Result Value Ref Range Status   MRSA, PCR NEGATIVE NEGATIVE Final   Staphylococcus aureus POSITIVE (A) NEGATIVE Final    Comment: (NOTE) The Xpert SA Assay (FDA approved for NASAL specimens in patients 31 years of age and older), is one component of a comprehensive surveillance program. It is not intended to diagnose infection nor to guide or monitor treatment. Performed at [facility], [place] [address], [place], [place] [address]   CSF culture with Stat gram stain     Status: None (Preliminary result)   Collection Time: 04/08/19 10:09 AM   Specimen: CSF; Cerebrospinal Fluid  Result Value Ref Range Status   Specimen Description CSF  Final   Special Requests NONE  Final   Gram Stain   Final    WBC PRESENT, PREDOMINANTLY MONONUCLEAR NO ORGANISMS SEEN CYTOSPIN SMEAR Performed at [facility], [place] [address] [place],  [address]     Culture PENDING  Incomplete   Report Status PENDING  Incomplete     Studies: No results found.  Scheduled Meds: .  diltiazem  180 mg Oral Daily  . feeding supplement (ENSURE ENLIVE)  237 mL Oral TID BM  . lidocaine  5 mL Infiltration Once  . metoprolol tartrate  25 mg Oral BID  . multivitamin with minerals  1 tablet Oral Daily  . polyethylene glycol  17 g Oral Daily  . predniSONE  50 mg Oral Q breakfast  . senna-docusate  1 tablet Oral BID    Continuous Infusions: . sodium chloride 250 mL (04/06/19 1519)  . sodium chloride 10 mL/hr at 04/07/19 2016  . diltiazem (CARDIZEM) infusion Stopped (04/05/19 0106)  . methylPREDNISolone (SOLU-MEDROL) injection 1,000 mg (04/08/19 2028)     Time spent: 19mns,  I have personally reviewed and interpreted on  04/09/2019 daily labs, imagings as discussed above under date review session and assessment and plans.  I reviewed all nursing notes, pharmacy notes, consultant notes,  vitals, pertinent old records  I have discussed plan of care as described above with RN , patient on 04/09/2019   FFlorencia ReasonsMD, PhD, FACP  [facility]  Available via Epic secure chat 7am-7pm for nonurgent issues Please page for urgent issues, pager number available through [employer]iol[employer] .   04/09/2019, 7:27 AM  LOS: 5 days

## 2019-04-09 NOTE — Progress Notes (Signed)
Reason for consult: Vision loss  Subjective: No improvement in vision.  Denies any headache.   ROS: negative except above  Examination  Vital signs in last 24 hours: Temp:  [97.5 F (36.4 C)-98.1 F (36.7 C)] 97.5 F (36.4 C) (03/07 0420) Pulse Rate:  [65-81] 77 (03/07 0420) Resp:  [16-18] 18 (03/07 0420) BP: (110-133)/(55-69) 113/68 (03/07 0420) SpO2:  [92 %-99 %] 96 % (03/07 0420) Weight:  [57.5 kg] 57.5 kg (03/07 0420)  General: lying in bed CVS: pulse-normal rate and rhythm RS: breathing comfortably Extremities: normal   Neuro: MS: Alert, oriented, follows commands CN: pupils equal and reactive, VF: Right eye: Finger counts. 20/400-left eye unable to finger count.  EOMI, face symmetric, tongue midline, normal sensation over face, Motor: 5/5 strength in all 4 extremities Reflexes: 2+ bilaterally over patella, biceps, plantars: flexor Coordination: normal Gait: not tested  Basic Metabolic Panel: Recent Labs  Lab 04/04/19 1735 04/04/19 1735 04/06/19 0231 04/06/19 0231 04/07/19 0452 04/08/19 0439 04/09/19 0337  NA 135  --  138  --  140 142 142  K 3.9  --  4.2  --  4.0 4.1 4.5  CL 96*  --  96*  --  96* 100 102  CO2 28  --  29  --  31 30 30   GLUCOSE 122*  --  174*  --  161* 166* 198*  BUN 23  --  33*  --  42* 49* 49*  CREATININE 0.78  --  1.04  --  0.94 0.98 0.93  CALCIUM 8.8*   < > 9.3   < > 9.2 9.0 8.7*  MG  --   --  1.9  --   --  2.4  --    < > = values in this interval not displayed.    CBC: Recent Labs  Lab 04/04/19 1735 04/06/19 0231 04/07/19 0452 04/08/19 0439 04/09/19 0337  WBC 8.0 4.8 4.7 4.6 4.0  NEUTROABS  --  3.9 4.2 4.0  --   HGB 11.4* 10.6* 10.3* 10.6* 10.2*  HCT 34.8* 32.1* 31.8* 32.7* 31.0*  MCV 96.4 94.7 96.1 95.1 94.2  PLT 259 256 249 238 216     Coagulation Studies: Recent Labs    04/07/19 0910  LABPROT 15.3*  INR 1.2    Imaging Reviewed:     ASSESSMENT AND PLAN  86 year old with with bilateral vision loss that  happened a few days ago, with concern for giant cell arteritis based on otological exam sent to [facility] by ophthalmologist Dr. Satira Sark.  ESR and CRP elevated.  MRI oribits and MRI brain  show abnormal perineural enhancement around the optic nerves bilaterally and diffuse dural enhancement   (Also focal dural thickening in the right frontal lobe consistent with meningioma a second meningioma present in the right parietal lobe.)   Differential  diagnosis includes carcinomatous meningitis-therefore LP was performed and sent for cytology. Wegener's granulomatosis- ANCA titers still pending.  Underwent temporal artery biopsy Underwent LP-0 RBCs, 3 WBCs, mildly elevated protein at 56.  Glucose 99.  CSF culture no growth, cytology/flow cytometric pending.  Paraneoplastic panel pending  Patient has received 3 doses of high-dose steroids-patient states he has not had significant improvement.  However ESR CRP has improved.   Suspected giant cell arteritis   -Continue 2 more days 1 g IV Solu-Medrol -Discharge on 60 mg prednisone -Also ordered Bartonella antibodies, consider empiric doxycycline as he has not had much improvement with steroids-D/C if negative -Follow-up CSF studies, temporal artery biopsy  report    Jeree Delcid Patient no problem

## 2019-04-10 DIAGNOSIS — E43 Unspecified severe protein-calorie malnutrition: Secondary | ICD-10-CM

## 2019-04-10 DIAGNOSIS — N4889 Other specified disorders of penis: Secondary | ICD-10-CM

## 2019-04-10 DIAGNOSIS — I10 Essential (primary) hypertension: Secondary | ICD-10-CM

## 2019-04-10 DIAGNOSIS — C439 Malignant melanoma of skin, unspecified: Secondary | ICD-10-CM

## 2019-04-10 DIAGNOSIS — R000 Tachycardia, unspecified: Secondary | ICD-10-CM

## 2019-04-10 DIAGNOSIS — H539 Unspecified visual disturbance: Secondary | ICD-10-CM

## 2019-04-10 DIAGNOSIS — D649 Anemia, unspecified: Secondary | ICD-10-CM

## 2019-04-10 LAB — CBC
HCT: 32.1 % — ABNORMAL LOW (ref 39.0–52.0)
Hemoglobin: 10.3 g/dL — ABNORMAL LOW (ref 13.0–17.0)
MCH: 31 pg (ref 26.0–34.0)
MCHC: 32.1 g/dL (ref 30.0–36.0)
MCV: 96.7 fL (ref 80.0–100.0)
Platelets: 208 10*3/uL (ref 150–400)
RBC: 3.32 MIL/uL — ABNORMAL LOW (ref 4.22–5.81)
RDW: 13.4 % (ref 11.5–15.5)
WBC: 4.5 10*3/uL (ref 4.0–10.5)
nRBC: 0 % (ref 0.0–0.2)

## 2019-04-10 LAB — SURGICAL PATHOLOGY

## 2019-04-10 LAB — ANCA TITERS
Atypical P-ANCA titer: <1:20 {titer}
C-ANCA: <1:20 {titer}
P-ANCA: <1:20 {titer}

## 2019-04-10 LAB — BASIC METABOLIC PANEL
Anion gap: 9 (ref 5–15)
BUN: 48 mg/dL — ABNORMAL HIGH (ref 8–23)
CO2: 33 mmol/L — ABNORMAL HIGH (ref 22–32)
Calcium: 8.8 mg/dL — ABNORMAL LOW (ref 8.9–10.3)
Chloride: 102 mmol/L (ref 98–111)
Creatinine, Ser: 0.97 mg/dL (ref 0.61–1.24)
GFR calc Af Amer: >60 mL/min (ref >60)
GFR calc non Af Amer: >60 mL/min (ref >60)
Glucose, Bld: 181 mg/dL — ABNORMAL HIGH (ref 70–99)
Potassium: 5 mmol/L (ref 3.5–5.1)
Sodium: 144 mmol/L (ref 135–145)

## 2019-04-10 LAB — BARTONELLA ANTIBODY PANEL
B Quintana IgM: NEGATIVE titer
B henselae IgG: NEGATIVE titer
B henselae IgM: NEGATIVE titer
B quintana IgG: NEGATIVE titer

## 2019-04-10 LAB — FANA STAINING PATTERNS
Homogeneous Pattern: 1:320 {titer} — ABNORMAL HIGH
Speckled Pattern: 1:80 {titer}

## 2019-04-10 LAB — CYTOLOGY - NON PAP

## 2019-04-10 LAB — SEDIMENTATION RATE: Sed Rate: 18 mm/hr — ABNORMAL HIGH (ref 0–16)

## 2019-04-10 LAB — C-REACTIVE PROTEIN: CRP: 0.7 mg/dL (ref <1.0)

## 2019-04-10 LAB — ANTINUCLEAR ANTIBODIES, IFA: ANA Ab, IFA: POSITIVE — AB

## 2019-04-10 MED ORDER — PANTOPRAZOLE SODIUM 20 MG PO TBEC: 20.0000 mg | DELAYED_RELEASE_TABLET | Freq: Every day | ORAL | 1 refills | Status: AC

## 2019-04-10 MED ORDER — DILTIAZEM HCL ER COATED BEADS 180 MG PO CP24: 180.0000 mg | ORAL_CAPSULE | Freq: Every day | ORAL | 2 refills | Status: DC

## 2019-04-10 MED ORDER — ENSURE ENLIVE PO LIQD: 237.0000 mL | Freq: Three times a day (TID) | ORAL | 12 refills | Status: AC

## 2019-04-10 MED ORDER — PREDNISONE 20 MG PO TABS: 60.0000 mg | ORAL_TABLET | Freq: Every day | ORAL | 0 refills | Status: AC

## 2019-04-10 NOTE — Care Management Important Message (Signed)
Important Message  Patient Details  Name: Matthew Hickman MRN: [id number] Date of Birth: 1925/12/27   Medicare Important Message Given:  Yes     Shelda Altes 04/10/2019, 10:42 AM

## 2019-04-10 NOTE — Discharge Summary (Addendum)
Physician Discharge Summary  DEVONTRE SIEDSCHLAG HQI:[id number] DOB: July 29, 1925 DOA: 04/04/2019  PCP: [name], MD  Admit date: 04/04/2019 Discharge date: 04/10/2019 Consultations: Ophthalmology Dr. Satira Sark, neurology Dr. Lorraine Lax, cardiology Dr. Burt Knack, general surgery Dr.Lovick Admitted From: Home Disposition: Home  Discharge Diagnoses:  Principal Problem:   Giant cell arteritis ([place]) Active Problems:   Visual changes   Tachycardia   Benign essential HTN   Protein-calorie malnutrition, severe   Normocytic anemia   Melanoma ([place])   Penile bleeding   Hospital Course Summary: 86 y.o.M with history ofmelanoma s/p surgery in 01/2019, HTN, recent diagnosis of atrial fibrillation, bilateral clubfoot surgery who presented with sudden onset of bilateral visual loss. Patient has also had headache with tenderness in his bilateral temporal areas along with difficulty chewing food. Symptoms have been going on for a few days. 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  ED Course: Afebrile, felt to have atrial fibrillation with rapid ventricular response. BP 141/65 pulse 120 respirate 28 oxygen sats 97% on room air. Urinalysis negative except for RBC of 21-50 and large hemoglobin. Chest x-ray NL. ESR 73,CRP 13, WBC 8.1, Hgb 11.4. BMP normal except for albumin of 2.7. COVID-19 is negative. MRI of the orbits ordered. Neurology consulted by ER who recommended high-dose steroids. Given a dose of Solu-Medrol and being admitted to the hospital for treatment.  Hospital course: Patient admitted to [facility] and initiated on Cardizem drip/IV steroids. Vision has improved but remains blurry in left > rt eye. ESR CRP trending down, headache resolved. Ophthalmology Dr Satira Sark and Neurology consulted.  Patient underwent bilateral temporal artery biopsy on 04/07/19 and lumbar puncture for CSF analysis on 04/08/19. Cardiology evaluated patient  for tachycardia and A fib was ruled out.   Patient seen by general surgery and underwent temporal artery biopsy.  Assessment/Plan:  Acute vision loss/giant cell arteritis: Temporal artery biopsy results confirmed giant cell arteritis -consistent with clinical picture given temporal tenderness/headache/jaw cluadication and elevated ESR.  Patient received  high dose IV steroids during the hospital course.Inflammatory markers down-trending. Neurology/opthalmology evaluated patient and alternate diagnoses were also entertained initially. Bartonella serology test ordered by neurology resulted negative-patient received empiric doxycycline while here but can be discontinued per neurologyas serology now negative and biopsy confirmed giant cell arteritis.  Per Dr. Mellissa Kohut recommendations, will discharge patient on prednisone 60 mg for 4 to 6 weeks following which he will need a slow taper over 1 to 2 years.  He will follow-up with Dr. Satira Sark in 2 weeks.  He will also follow-up with neurology clinic and will possibly need rheumatology referral as outpatient.  Patient currently reports improvement in vision, still "shaded view like it is evening", right eye better than left.  His near vision appears to be better than far.  Seen by PT and recommended home PT/OT as well as walker for safety.  I did call and discussed with wife in detail regarding prolonged prednisone therapy and need to refill prescriptions when ready to taper down.  Sinus arrhythmia with PACs:  Atrial fib ruled out. Cardiology was consulted and concern for possible new onset A. fib with RVR but cardiology evaluated patient and did not think his cardiac rhythm was consistent with  A. Fib, Eliquis discontinued.cardiology however recommended  to continue Cardizem and beta-blocker for sinus tachycardia with PACs. He denies chest pain/palpitations currently.  His heart rate appears to be fluctuating between 40-90, he may have underlying sick  sinus  syndrome.  Cardiology has signed off, recommend outpatient follow up-may benefit from Holter if he has recurrent episodes.   Penile bleeding/normocytic anemia: retic inappropriately low. Superficial blood on surface of stool observed on March 6, suspect hemorrhoids. Hgbstable above 10.  Interestingly, patient was noted to have penile bleeding (drops of bright red blood when walked with PT today and per nurse had small clot in the meatus which was cleaned).  Patient denies noting this in the past however wife stated patient did have similar episode about a month back.  He is recommended outpatient urology follow-up (referral placed).  Urinal at bedside does not show any evidence of gross hematuria.  Melanoma: s/p surgery in 01/2019.  Hypertension: Stable on current medication  Impaired fasting blood glucose: Likely due to steroid. A1c 5  Severe malnutrition:  in context of chronic illness. Underweight with BMI 17.2 kg/m, severe fat depletion, severe muscle depletion. Seen by [facility]etic[facility] nad on  Ensure Enlive (each supplement provides 350kcal and 20 grams of protein), MVI, Magic cup.     Discharge Exam:  Vitals:   04/10/19 1206 04/10/19 1656  BP: 114/64 122/63  Pulse: (!) 51 (!) 42  Resp: 17 17  Temp: (!) 97.5 F (36.4 C) (!) 97.5 F (36.4 C)  SpO2: 100% 97%   Vitals:   04/10/19 0306 04/10/19 0951 04/10/19 1206 04/10/19 1656  BP: 121/76 125/84 114/64 122/63  Pulse: 68 90 (!) 51 (!) 42  Resp: _0 Temp: 97.6 F (36.4 C) (!) 97.5 F (36.4 C) (!) 97.5 F (36.4 C) (!) 97.5 F (36.4 C)  TempSrc: Oral Oral Oral Oral  SpO2: 100% 100% 100% 97%  Weight: 58.2 kg     Height:        General: Pt is alert, awake, not in acute distress, hard of hearing Cardiovascular: RRR, S1/S2 +, no rubs, no gallops Respiratory: CTA bilaterally, no wheezing, no rhonchi Abdominal: Soft, NT, ND, bowel sounds + Extremities: no edema, no cyanosis  Discharge Condition:Stable CODE STATUS:  Full code Diet recommendation: Heart healthy diet Recommendations for Outpatient Follow-up:  1. Follow up with PCP: 1 week 2. Follow up with consultants: Ophthalmology Dr. Satira Sark in 10 to 14 days, neurology clinic in 2 to 3 weeks, cardiology clinic Dr. Burt Knack in 3 weeks and urology as scheduled (referral placed), rheumatology as scheduled (referral to be placed through PCP as outpatient) 3. Please obtain follow up labs including: CBC/BMP/ESR  Home Health services upon discharge: Home PT/OT Equipment/Devices upon discharge: walker with 5 inch wheels   Discharge Instructions:  Discharge Instructions    Ambulatory referral to Cardiology   Complete by: As directed    Hospital f/u for tachycardia --SSS vs Afib   Ambulatory referral to Urology   Complete by: As directed    Penile bleeding   Call MD for:   Complete by: As directed    Worsening vision/headache   Call MD for:  difficulty breathing, headache or visual disturbances   Complete by: As directed    Call MD for:  persistant dizziness or light-headedness   Complete by: As directed    Call MD for:  persistant nausea and vomiting   Complete by: As directed    Call MD for:  severe uncontrolled pain   Complete by: As directed    Call MD for:  temperature >100.4   Complete by: As directed    Diet - low sodium heart healthy   Complete by: As directed  Increase activity slowly   Complete by: As directed      Allergies as of 04/10/2019      Reactions   Tape Other (See Comments)   PATIENT'S SKIN IS THIN AND IT TEARS EASILY      Medication List    STOP taking these medications   cephALEXin 250 MG capsule Commonly known as: KEFLEX   hydrochlorothiazide 25 MG tablet Commonly known as: HYDRODIURIL   traMADol 50 MG tablet Commonly known as: ULTRAM     TAKE these medications   diltiazem 180 MG 24 hr capsule Commonly known as: CARDIZEM CD Take 1 capsule (180 mg total) by mouth daily. Start taking on: April 11, 2019    feeding supplement (ENSURE ENLIVE) Liqd Take 237 mLs by mouth 3 (three) times daily between meals.   metoprolol tartrate 25 MG tablet Commonly known as: LOPRESSOR Take 25 mg by mouth 2 (two) times daily.   multivitamin with minerals Tabs tablet Take 1 tablet by mouth daily.   pantoprazole 20 MG tablet Commonly known as: PROTONIX Take 1 tablet (20 mg total) by mouth daily. Start taking on: April 11, 2019   predniSONE 20 MG tablet Commonly known as: DELTASONE Take 3 tablets (60 mg total) by mouth daily.   terazosin 1 MG capsule Commonly known as: HYTRIN Take 1 mg by mouth at bedtime.            Durable Medical Equipment  (From admission, onward)         Start     Ordered   04/10/19 1604  DME Walker  Once    Question Answer Comment  Walker: With 5 Inch Wheels   Patient needs a walker to treat with the following condition Physical deconditioning      04/10/19 1603         Follow-up Information    [name], MD Follow up.   Specialty: Internal Medicine Contact information: [address] [place] [address] [phone]        [facility].   Specialty: Cardiology Contact information: [address] [place]       Marygrace Drought, MD Follow up in 1 week(s).   Specialty: Ophthalmology Contact information: [place] [address] [facility], [place]. Go on 04/20/2019.   Specialty: General Surgery Why: Your appointment is 3/18 at 10:30am for wound check after your temporal artery biopsy. Please arrive 30 minutes prior to your appointment to check in and fill out paperwork. Bring photo ID and insurance information. Contact information: [address] [place], [place] Follow up.   Specialty: Home Health  Services Why: HHPT/HHOT Contact information: [place] [address] [phone]          Allergies  Allergen Reactions  . Tape Other (See Comments)    PATIENT'S SKIN IS THIN AND IT TEARS EASILY      The results of significant diagnostics from this hospitalization (including imaging, microbiology, ancillary and laboratory) are listed below for reference.    Labs: BNP (last 3 results) No results for input(s): BNP in the last 8760 hours. Basic Metabolic Panel: Recent Labs  Lab 04/06/19 0231 04/07/19 0452 04/08/19 0439 04/09/19 0337 04/10/19 0403  NA 138 140 142 142 144  K  4.2 4.0 4.1 4.5 5.0  CL 96* 96* 100 102 102  CO2 _0 33*  GLUCOSE 174* 161* 166* 198* 181*  BUN 33* 42* 49* 49* 48*  CREATININE 1.04 0.94 0.98 0.93 0.97  CALCIUM 9.3 9.2 9.0 8.7* 8.8*  MG 1.9  --  2.4  --   --    Liver Function Tests: Recent Labs  Lab 04/04/19 1735  AST 30  ALT 22  ALKPHOS 117  BILITOT 0.9  PROT 6.9  ALBUMIN 2.7*   No results for input(s): LIPASE, AMYLASE in the last 168 hours. No results for input(s): AMMONIA in the last 168 hours. CBC: Recent Labs  Lab 04/06/19 0231 04/07/19 0452 04/08/19 0439 04/09/19 0337 04/10/19 0403  WBC 4.8 4.7 4.6 4.0 4.5  NEUTROABS 3.9 4.2 4.0  --   --   HGB 10.6* 10.3* 10.6* 10.2* 10.3*  HCT 32.1* 31.8* 32.7* 31.0* 32.1*  MCV 94.7 96.1 95.1 94.2 96.7  PLT 256 249 238 216 208   Cardiac Enzymes: No results for input(s): CKTOTAL, CKMB, CKMBINDEX, TROPONINI in the last 168 hours. BNP: Invalid input(s): POCBNP CBG: No results for input(s): GLUCAP in the last 168 hours. D-Dimer No results for input(s): DDIMER in the last 72 hours. Hgb A1c No results for input(s): HGBA1C in the last 72 hours. Lipid Profile No results for input(s): CHOL, HDL, LDLCALC, TRIG, CHOLHDL, LDLDIRECT in the last 72 hours. Thyroid function studies No results for input(s): TSH, T4TOTAL, T3FREE, THYROIDAB in the last 72  hours.  Invalid input(s): FREET3 Anemia work up No results for input(s): VITAMINB12, FOLATE, FERRITIN, TIBC, IRON, RETICCTPCT in the last 72 hours. Urinalysis    Component Value Date/Time   COLORURINE YELLOW 04/04/2019 1949   APPEARANCEUR CLEAR 04/04/2019 1949   LABSPEC 1.015 04/04/2019 1949   PHURINE 6.0 04/04/2019 1949   GLUCOSEU NEGATIVE 04/04/2019 1949   HGBUR LARGE (A) 04/04/2019 1949   BILIRUBINUR NEGATIVE 04/04/2019 1949   KETONESUR NEGATIVE 04/04/2019 1949   PROTEINUR NEGATIVE 04/04/2019 1949   UROBILINOGEN 1.0 09/20/2010 1226   NITRITE NEGATIVE 04/04/2019 1949   LEUKOCYTESUR TRACE (A) 04/04/2019 1949   Sepsis Labs Invalid input(s): PROCALCITONIN,  WBC,  LACTICIDVEN Microbiology Recent Results (from the past 240 hour(s))  Urine culture     Status: None   Collection Time: 04/01/19  1:30 PM   Specimen: Urine, Random  Result Value Ref Range Status   Specimen Description URINE, RANDOM  Final   Special Requests NONE  Final   Culture   Final    NO GROWTH Performed at [facility], [place] [address], [place], [place] [address]    Report Status 04/02/2019 FINAL  Final  SARS CORONAVIRUS 2 (TAT 6-24 HRS) Nasopharyngeal Nasopharyngeal Swab     Status: None   Collection Time: 04/04/19  7:23 PM   Specimen: Nasopharyngeal Swab  Result Value Ref Range Status   SARS Coronavirus 2 NEGATIVE NEGATIVE Final    Comment: (NOTE) SARS-CoV-2 target nucleic acids are NOT DETECTED. The SARS-CoV-2 RNA is generally detectable in upper and lower respiratory specimens during the acute phase of infection. Negative results do not preclude SARS-CoV-2 infection, do not rule out co-infections with other pathogens, and should not be used as the sole basis for treatment or other patient management decisions. Negative results must be combined with clinical observations, patient history, and epidemiological information. The expected result is Negative. Fact Sheet for  Patients: SugarRoll.be Fact Sheet for Healthcare Providers: https://www.woods-mathews.com/ This test is not yet approved  or cleared by the [place] and  has been authorized for detection and/or diagnosis of SARS-CoV-2 by FDA under an Emergency Use Authorization (EUA). This EUA will remain  in effect (meaning this test can be used) for the duration of the COVID-19 declaration under Section 56 4(b)(1) of the Act, 21 U.S.C. section 360bbb-3(b)(1), unless the authorization is terminated or revoked sooner. Performed at [facility], [place] [address], [place], [place] [address]   Surgical pcr screen     Status: Abnormal   Collection Time: 04/07/19  1:03 AM   Specimen: Nasal Mucosa; Nasal Swab  Result Value Ref Range Status   MRSA, PCR NEGATIVE NEGATIVE Final   Staphylococcus aureus POSITIVE (A) NEGATIVE Final    Comment: (NOTE) The Xpert SA Assay (FDA approved for NASAL specimens in patients 14 years of age and older), is one component of a comprehensive surveillance program. It is not intended to diagnose infection nor to guide or monitor treatment. Performed at [facility], [place] [address], [place], [place] [address]   CSF culture with Stat gram stain     Status: None (Preliminary result)   Collection Time: 04/08/19 10:09 AM   Specimen: CSF; Cerebrospinal Fluid  Result Value Ref Range Status   Specimen Description CSF  Final   Special Requests NONE  Final   Gram Stain   Final    WBC PRESENT, PREDOMINANTLY MONONUCLEAR NO ORGANISMS SEEN CYTOSPIN SMEAR    Culture   Final    NO GROWTH 2 DAYS Performed at [facility], [place] [address] [place],  [address]    Report Status PENDING  Incomplete    Procedures/Studies: MR BRAIN W WO CONTRAST  Result Date: 04/07/2019 CLINICAL DATA:  Binocular vision loss. Recent diagnosis of melanoma. Suspected arteritis. EXAM: MRI HEAD WITHOUT AND WITH CONTRAST TECHNIQUE:  Multiplanar, multiecho pulse sequences of the brain and surrounding structures were obtained without and with intravenous contrast. CONTRAST:  5.68m GADAVIST GADOBUTROL 1 MMOL/ML IV SOLN COMPARISON:  MR orbits without and with contrast 04/04/2019 FINDINGS: Brain: Mild diffuse dural enhancement remains. Focal dural enhancement over the right frontal lobe measures 9 x 4 x 13 mm, consistent with a small meningioma. A second meningioma is present over the right parietal lobe measuring 13 x 8 x 11 mm. Enhancement along the optic nerve is again noted. Assessment distal enhancement in the orbit is limited without fat saturated images. T1 shortening is again seen within the basal ganglia bilaterally. No acute infarct, hemorrhage, or mass lesion is present. Atrophy is within normal limits for age. The internal auditory canals are within normal limits. The brainstem and cerebellum are within normal limits. Vascular: Flow is present in the major intracranial arteries. Skull and upper cervical spine: The craniocervical junction is normal. Upper cervical spine is within normal limits. Marrow signal is unremarkable. Sinuses/Orbits: Polyp or mucous retention cyst is noted laterally within the left maxillary sinus. Remaining paranasal sinuses are clear. There is some fluid in the mastoid air cells bilaterally. Proximal optic nerve sheath enhancement is again noted. Bilateral lens replacements are present. Globes and orbits are otherwise within limits. IMPRESSION: 1. Diffuse dural enhancement is stable to slightly decreased. 2. Stable appearance of the optic nerve sheath enhancement. This is slightly improved, but remains concerning for arteritis. 3. Focal dural thickening over the right frontal lobe is most consistent with a meningioma. A second meningioma is present over the right parietal lobe. 4. Stable T1 shortening within the basal ganglia bilaterally.  Question hepatic disease. 5. Polyp or mucous retention cyst within the left  maxillary sinus. 6. Minimal fluid in the mastoid air cells bilaterally. No obstructing nasopharyngeal lesion is evident. Electronically Signed   By: San Morelle M.D.   On: 04/07/2019 06:34   DG Chest Port 1 View  Result Date: 04/04/2019 CLINICAL DATA:  86 year old male with cough. EXAM: PORTABLE CHEST 1 VIEW COMPARISON:  Chest radiograph dated 09/17/2010. FINDINGS: Minimal bibasilar atelectasis. No focal consolidation, pleural effusion, or pneumothorax. Stable cardiac silhouette. Atherosclerotic calcification of the aorta. No acute osseous pathology. IMPRESSION: No acute cardiopulmonary process. Electronically Signed   By: Anner Crete M.D.   On: 04/04/2019 19:08   ECHOCARDIOGRAM COMPLETE  Result Date: 04/05/2019    ECHOCARDIOGRAM REPORT   Patient Name:   ESSAM LOWDERMILK Date of Exam: 04/05/2019 Medical Rec #:  [id number]      Height:       72.0 in Accession #:    [id number]     Weight:       128.4 lb Date of Birth:  [birth year]      BSA:          1.765 m Patient Age:    86 years       BP:           119/73 mmHg Patient Gender: M              HR:           83 bpm. Exam Location:  Inpatient Procedure: 2D Echo, Cardiac Doppler and Color Doppler Indications:    Atrial fibrillation 427.31/I48.91  History:        Patient has no prior history of Echocardiogram examinations.                 Risk Factors:Hypertension.  Sonographer:    Clayton Lefort RDCS (AE) Referring Phys: [id number] [place]  1. Left ventricular ejection fraction, by estimation, is 55 to 60%. The left ventricle has normal function. The left ventricle has no regional wall motion abnormalities. Left ventricular diastolic parameters are consistent with Grade I diastolic dysfunction (impaired relaxation).  2. Right ventricular systolic function is normal. The right ventricular size is normal. There is moderately elevated pulmonary artery systolic pressure. The estimated right ventricular systolic pressure is 43.3 mmHg.  3. The mitral  valve is normal in structure and function. Trivial mitral valve regurgitation. No evidence of mitral stenosis.  4. The aortic valve is tricuspid. Aortic valve regurgitation is not visualized. No aortic stenosis is present.  5. Aortic dilatation noted. There is mild dilatation of the ascending aorta measuring 40 mm.  6. The inferior vena cava is dilated in size with <50% respiratory variability, suggesting right atrial pressure of 15 mmHg. FINDINGS  Left Ventricle: Left ventricular ejection fraction, by estimation, is 55 to 60%. The left ventricle has normal function. The left ventricle has no regional wall motion abnormalities. The left ventricular internal cavity size was normal in size. There is  no left ventricular hypertrophy. Left ventricular diastolic parameters are consistent with Grade I diastolic dysfunction (impaired relaxation). Right Ventricle: The right ventricular size is normal. No increase in right ventricular wall thickness. Right ventricular systolic function is normal. There is moderately elevated pulmonary artery systolic pressure. The tricuspid regurgitant velocity is 2.61 m/s, and with an assumed right atrial pressure of 15 mmHg, the estimated right ventricular systolic pressure is 29.5 mmHg. Left Atrium: Left atrial size was normal in size. Right Atrium: Right atrial size  was normal in size. Pericardium: There is no evidence of pericardial effusion. Mitral Valve: The mitral valve is normal in structure and function. Trivial mitral valve regurgitation. No evidence of mitral valve stenosis. Tricuspid Valve: The tricuspid valve is normal in structure. Tricuspid valve regurgitation is trivial. Aortic Valve: The aortic valve is tricuspid. Aortic valve regurgitation is not visualized. No aortic stenosis is present. Pulmonic Valve: The pulmonic valve was normal in structure. Pulmonic valve regurgitation is not visualized. Aorta: The aortic root is normal in size and structure and aortic dilatation  noted. There is mild dilatation of the ascending aorta measuring 40 mm. Venous: The inferior vena cava is dilated in size with less than 50% respiratory variability, suggesting right atrial pressure of 15 mmHg. IAS/Shunts: No atrial level shunt detected by color flow Doppler.  LEFT VENTRICLE PLAX 2D LVIDd:         4.66 cm LVIDs:         3.76 cm LV PW:         0.99 cm LV IVS:        1.06 cm LVOT diam:     2.00 cm LV SV:         52 LV SV Index:   29 LVOT Area:     3.14 cm  RIGHT VENTRICLE             IVC RV Basal diam:  3.55 cm     IVC diam: 2.41 cm RV Mid diam:    2.48 cm RV S prime:     11.00 cm/s TAPSE (M-mode): 1.6 cm LEFT ATRIUM             Index       RIGHT ATRIUM           Index LA diam:        2.80 cm 1.59 cm/m  RA Area:     12.30 cm LA Vol (A2C):   35.4 ml 20.06 ml/m RA Volume:   26.40 ml  14.96 ml/m LA Vol (A4C):   37.2 ml 21.08 ml/m LA Biplane Vol: 38.7 ml 21.93 ml/m  AORTIC VALVE LVOT Vmax:   84.70 cm/s LVOT Vmean:  59.860 cm/s LVOT VTI:    0.164 m  AORTA Ao Root diam: 3.40 cm Ao Asc diam:  4.00 cm TRICUSPID VALVE TR Peak grad:   27.2 mmHg TR Vmax:        261.00 cm/s  SHUNTS Systemic VTI:  0.16 m Systemic Diam: 2.00 cm Loralie Champagne MD Electronically signed by Loralie Champagne MD Signature Date/Time: 04/05/2019/9:44:03 PM    Final    CT Renal Stone Study  Result Date: 04/01/2019 CLINICAL DATA:  86 year old male with hematuria EXAM: CT ABDOMEN AND PELVIS WITHOUT CONTRAST TECHNIQUE: Multidetector CT imaging of the abdomen and pelvis was performed following the standard protocol without IV contrast. COMPARISON:  Prior CT scan of the abdomen and pelvis 09/17/2010 FINDINGS: Lower chest: Mild dependent atelectasis. Minimal subpleural reticulation in the inferior most aspects of the lungs likely represents mild fibrotic change. Mild cardiomegaly. Calcifications noted along the coronary arteries. Calcification also noted within the left inferior papillary muscle. No pericardial effusion. Unremarkable  thoracic esophagus. Hepatobiliary: Normal hepatic contour and morphology. No discrete hepatic lesions. Normal appearance of the gallbladder. No intra or extrahepatic biliary ductal dilatation. Pancreas: Unremarkable. No pancreatic ductal dilatation or surrounding inflammatory changes. Spleen: Normal in size without focal abnormality. Adrenals/Urinary Tract: Unremarkable adrenal glands. No evidence of hydronephrosis, nephrolithiasis or focal renal contour abnormality to suggest  an exophytic mass. Evaluate for enhancing lesions is not possible in the absence of intravenous contrast. Both ureters are also unremarkable. The bladder is within normal limits. Stomach/Bowel: There are a few scattered colonic diverticula but no evidence of active inflammation. No focal bowel wall thickening or evidence of obstruction. Vascular/Lymphatic: Limited evaluation in the absence of intravenous contrast. Atherosclerotic calcifications noted along the abdominal aorta. No evidence of suspicious lymphadenopathy. Reproductive: Marked prostatomegaly. The prostate gland measures 6.0 x 5.2 x 4.6 cm (volume = 75 cm^3) Other: No ascites or abdominal wall hernia. Musculoskeletal: Insert skip bones mild multilevel degenerative changes. IMPRESSION: 1. No nephrolithiasis, hydronephrosis or exophytic renal mass. 2. Marked prostatomegaly. The prostate gland is approximately 75 g in size. This could conceivably represent a source of hematuria. 3. Mild cardiomegaly. 4. Coronary artery calcifications. 5. Mild colonic diverticulosis without evidence of active inflammation. 6. Aortic Atherosclerosis (ICD10-170.0) Electronically Signed   By: Jacqulynn Cadet M.D.   On: 04/01/2019 12:49   MR ORBITS W WO CONTRAST  Result Date: 04/04/2019 CLINICAL DATA:  Headache.  Basilar orbital giant cell arteritis. EXAM: MRI OF THE ORBITS WITHOUT AND WITH CONTRAST TECHNIQUE: Multiplanar, multisequence MR imaging of the orbits was performed both before and after the  administration of intravenous contrast. CONTRAST:  54m GADAVIST GADOBUTROL 1 MMOL/ML IV SOLN COMPARISON:  None. FINDINGS: Bilateral cataract extraction. Normal shape and signal in the globe without mass lesion. No orbital mass or edema. Orbital fat normal. No enhancing mass in the orbit. Optic nerve normal in size and signal. Following contrast infusion there is prominent perineural enhancement of the optic nerve bilaterally. The optic nerve does not enhance significantly. Optic chiasm normal. Cavernous sinus normal. Limited imaging of the brain demonstrates generalized atrophy. No acute infarct on diffusion-weighted imaging. Postcontrast imaging of the brain reveals diffuse dural thickening surrounding both cerebral hemispheres right greater than left. Nodular enhancement of the right frontal dura measuring approximately 11 mm, and nodular enhancement of the right parietal dura measuring approximately 8 x 11 mm. Normal enhancement of the optic chiasm. IMPRESSION: Abnormal perineural enhancement around the optic nerve bilaterally. The nerve itself does not enhance and shows normal signal on T2. This pattern could be seen with vasculitis such as giant cell arteritis, Wegener's granulomatosis, sarcoidosis. In addition, there is abnormal dural thickening and enhancement around both cerebral hemispheres bilaterally, with areas of nodular dural based enhancement in the right frontal right parietal lobe. Differential includes meningioma as well as metastatic disease to the dura. Given the optic nerve Perineuritis, the dural enhancement may also be related to the same process favoring vasculitis. These results were called by telephone at the time of interpretation on 04/04/2019 at 9:21 pm to provider DAVID YAO , who verbally acknowledged these results. Electronically Signed   By: CFranchot GalloM.D.   On: 04/04/2019 21:21    Time coordinating discharge: Over 30 minutes  SIGNED:   NGuilford Shi MD  [facility] 04/10/2019, 5:36 PM

## 2019-04-10 NOTE — TOC Transition Note (Signed)
Transition of Care [facility]) - CM/SW Discharge Note   Patient Details  Name: AYDIEN SUDBECK MRN: [id number] Date of Birth: [birth year]  [facility]) CM/SW Contact:  Zenon Mayo, RN Phone Number: 04/10/2019, 4:47 PM   Clinical Narrative:    NCM offered choice to dlaughter , she chose [facility], referral made to [facility] for HHPT/HHOT, soc will begin 24 to 48 hrs post dc.    Final next level of care: [place] Barriers to Discharge: No Barriers Identified   Patient Goals and CMS Choice Patient states their goals for this hospitalization and ongoing recovery are:: get well CMS Medicare.gov Compare Post Acute Care list provided to:: Patient Represenative (must comment) Choice offered to / list presented to : Adult Children  Discharge Placement                       Discharge Plan and Services                DME Arranged: (NA)         HH Arranged: PT, OT HH Agency: [facility] Date [place]: 04/10/19 Time [place]: [phone] Representative spoke with at [place]: [place] ([place]) Interventions     Readmission Risk Interventions No flowsheet data found.

## 2019-04-10 NOTE — Progress Notes (Signed)
Pt ambulated with PT and noticed drips of blood. Upon examination, blood noted come from penial with clots. MD made aware.

## 2019-04-10 NOTE — Progress Notes (Signed)
Physical Therapy Treatment Patient Details Name: Matthew Hickman MRN: [id number] DOB: 1926/01/15 Today's Date: 04/10/2019    History of Present Illness Pt is 86 yo male admitted with afib RVR.  Pt with PMH including HTN, sking CA, bil clubfoot surgery.    PT Comments    Pt progressing towards goals. Pt continues to be unsteady with decreased safety awareness requiring 24/7 supervision and use of RW for safe mobility. Pt is at a high falls risk. During stair negotiation pt started dripping blood, RN notified and came to assess. Pt with blood dripping from penis, appeared to be clot like. Acute PT to cont to follow.    Follow Up Recommendations  Home health PT;Supervision/Assistance - 24 hour     Equipment Recommendations  Rolling walker with 5" wheels    Recommendations for Other Services       Precautions / Restrictions Precautions Precautions: Fall Restrictions Weight Bearing Restrictions: No    Mobility  Bed Mobility               General bed mobility comments: pt sitting EOB upon PT arrival  Transfers Overall transfer level: Needs assistance Equipment used: Rolling walker (2 wheeled) Transfers: Sit to/from Stand Sit to Stand: Min guard         General transfer comment: cues for safe hand placement, pt reaching for objects to hold onto  Ambulation/Gait Ambulation/Gait assistance: Min guard Gait Distance (Feet): 300 Feet Assistive device: Rolling walker (2 wheeled) Gait Pattern/deviations: Trunk flexed;Wide base of support Gait velocity: decresaed Gait velocity interpretation: <1.31 ft/sec, indicative of household ambulator General Gait Details: pt extremely unsteady and reaching for objects to hold onto without AD, pt aware he needs RW for safe amb. Pt with wide base of support and waddling gait pattern, pt requiring occasional minA for walker management and verbal cues to stand upright   Stairs Stairs: Yes Stairs assistance: Min assist Stair  Management: One rail Left;Alternating pattern;Step to pattern;Forwards Number of Stairs: 10 General stair comments: Pt instructed to do step to for safety however pt desired to do alternating, pt with 2 hands on L hand rail as the lift chair is on the R side   Wheelchair Mobility    Modified Rankin (Stroke Patients Only)       Balance Overall balance assessment: Needs assistance Sitting-balance support: No upper extremity supported;Feet supported Sitting balance-Leahy Scale: Normal Sitting balance - Comments: pt able to don socks while sitting without difficulty   Standing balance support: Single extremity supported Standing balance-Leahy Scale: Poor Standing balance comment: dependent on support for safe standing and amb                            Cognition Arousal/Alertness: Awake/alert Behavior During Therapy: Impulsive(mildly impulsive) Overall Cognitive Status: Impaired/Different from baseline Area of Impairment: Safety/judgement                         Safety/Judgement: Decreased awareness of safety     General Comments: pt aware he needs something to hold onto but moves quickly      Exercises      General Comments General comments (skin integrity, edema, etc.): VSS, on stair pt began to drip blood from penis, RN notified      Pertinent Vitals/Pain Pain Assessment: No/denies pain    Home Living  Prior Function            PT Goals (current goals can now be found in the care plan section) Progress towards PT goals: Progressing toward goals    Frequency    Min 3X/week      PT Plan Current plan remains appropriate    Co-evaluation              AM-PAC PT "6 Clicks" Mobility   Outcome Measure  Help needed turning from your back to your side while in a flat bed without using bedrails?: None Help needed moving from lying on your back to sitting on the side of a flat bed without using  bedrails?: None Help needed moving to and from a bed to a chair (including a wheelchair)?: None Help needed standing up from a chair using your arms (e.g., wheelchair or bedside chair)?: A Little Help needed to walk in hospital room?: A Little Help needed climbing 3-5 steps with a railing? : A Little 6 Click Score: 21    End of Session Equipment Utilized During Treatment: Gait belt Activity Tolerance: Patient tolerated treatment well Patient left: in chair;with call bell/phone within reach;with chair alarm set;with family/visitor present Nurse Communication: Mobility status PT Visit Diagnosis: Unsteadiness on feet (R26.81)     Time: [id number] PT Time Calculation (min) (ACUTE ONLY): 33 min  Charges:  $Gait Training: 23-37 mins                     Kittie Plater, PT, DPT Acute Rehabilitation Services Pager #: [phone] Office #: [phone]    Berline Lopes 04/10/2019, 9:47 AM

## 2019-04-11 LAB — CSF CULTURE W GRAM STAIN: Culture: NO GROWTH

## 2019-04-13 LAB — CD19 AND CD20, FLOW CYTOMETRY

## 2019-04-17 DIAGNOSIS — D649 Anemia, unspecified: Secondary | ICD-10-CM | POA: Diagnosis not present

## 2019-04-17 DIAGNOSIS — N400 Enlarged prostate without lower urinary tract symptoms: Secondary | ICD-10-CM | POA: Diagnosis not present

## 2019-04-17 DIAGNOSIS — R636 Underweight: Secondary | ICD-10-CM | POA: Diagnosis not present

## 2019-04-17 DIAGNOSIS — M315 Giant cell arteritis with polymyalgia rheumatica: Secondary | ICD-10-CM | POA: Diagnosis not present

## 2019-04-17 DIAGNOSIS — Z85820 Personal history of malignant melanoma of skin: Secondary | ICD-10-CM | POA: Diagnosis not present

## 2019-04-17 DIAGNOSIS — I4891 Unspecified atrial fibrillation: Secondary | ICD-10-CM | POA: Diagnosis not present

## 2019-04-17 DIAGNOSIS — H547 Unspecified visual loss: Secondary | ICD-10-CM | POA: Diagnosis not present

## 2019-04-17 DIAGNOSIS — I700 Atherosclerosis of aorta: Secondary | ICD-10-CM | POA: Diagnosis not present

## 2019-04-17 DIAGNOSIS — E43 Unspecified severe protein-calorie malnutrition: Secondary | ICD-10-CM | POA: Diagnosis not present

## 2019-04-17 DIAGNOSIS — D320 Benign neoplasm of cerebral meninges: Secondary | ICD-10-CM | POA: Diagnosis not present

## 2019-04-17 DIAGNOSIS — Z681 Body mass index (BMI) 19 or less, adult: Secondary | ICD-10-CM | POA: Diagnosis not present

## 2019-04-17 DIAGNOSIS — R319 Hematuria, unspecified: Secondary | ICD-10-CM | POA: Diagnosis not present

## 2019-04-17 DIAGNOSIS — Z8505 Personal history of malignant neoplasm of liver: Secondary | ICD-10-CM | POA: Diagnosis not present

## 2019-04-17 DIAGNOSIS — J9811 Atelectasis: Secondary | ICD-10-CM | POA: Diagnosis not present

## 2019-04-17 DIAGNOSIS — Z7952 Long term (current) use of systemic steroids: Secondary | ICD-10-CM | POA: Diagnosis not present

## 2019-04-17 DIAGNOSIS — I119 Hypertensive heart disease without heart failure: Secondary | ICD-10-CM | POA: Diagnosis not present

## 2019-04-17 DIAGNOSIS — I2510 Atherosclerotic heart disease of native coronary artery without angina pectoris: Secondary | ICD-10-CM | POA: Diagnosis not present

## 2019-04-17 DIAGNOSIS — Z9181 History of falling: Secondary | ICD-10-CM | POA: Diagnosis not present

## 2019-04-19 DIAGNOSIS — M316 Other giant cell arteritis: Secondary | ICD-10-CM | POA: Diagnosis not present

## 2019-04-19 DIAGNOSIS — H47013 Ischemic optic neuropathy, bilateral: Secondary | ICD-10-CM | POA: Diagnosis not present

## 2019-04-20 DIAGNOSIS — H547 Unspecified visual loss: Secondary | ICD-10-CM | POA: Diagnosis not present

## 2019-04-20 DIAGNOSIS — M315 Giant cell arteritis with polymyalgia rheumatica: Secondary | ICD-10-CM | POA: Diagnosis not present

## 2019-04-20 DIAGNOSIS — I2510 Atherosclerotic heart disease of native coronary artery without angina pectoris: Secondary | ICD-10-CM | POA: Diagnosis not present

## 2019-04-20 DIAGNOSIS — I700 Atherosclerosis of aorta: Secondary | ICD-10-CM | POA: Diagnosis not present

## 2019-04-20 DIAGNOSIS — I4891 Unspecified atrial fibrillation: Secondary | ICD-10-CM | POA: Diagnosis not present

## 2019-04-20 DIAGNOSIS — I119 Hypertensive heart disease without heart failure: Secondary | ICD-10-CM | POA: Diagnosis not present

## 2019-04-24 DIAGNOSIS — I700 Atherosclerosis of aorta: Secondary | ICD-10-CM | POA: Diagnosis not present

## 2019-04-24 DIAGNOSIS — M315 Giant cell arteritis with polymyalgia rheumatica: Secondary | ICD-10-CM | POA: Diagnosis not present

## 2019-04-24 DIAGNOSIS — I2510 Atherosclerotic heart disease of native coronary artery without angina pectoris: Secondary | ICD-10-CM | POA: Diagnosis not present

## 2019-04-24 DIAGNOSIS — I119 Hypertensive heart disease without heart failure: Secondary | ICD-10-CM | POA: Diagnosis not present

## 2019-04-24 DIAGNOSIS — H547 Unspecified visual loss: Secondary | ICD-10-CM | POA: Diagnosis not present

## 2019-04-24 DIAGNOSIS — I4891 Unspecified atrial fibrillation: Secondary | ICD-10-CM | POA: Diagnosis not present

## 2019-04-27 DIAGNOSIS — I2510 Atherosclerotic heart disease of native coronary artery without angina pectoris: Secondary | ICD-10-CM | POA: Diagnosis not present

## 2019-04-27 DIAGNOSIS — I4891 Unspecified atrial fibrillation: Secondary | ICD-10-CM | POA: Diagnosis not present

## 2019-04-27 DIAGNOSIS — I700 Atherosclerosis of aorta: Secondary | ICD-10-CM | POA: Diagnosis not present

## 2019-04-27 DIAGNOSIS — I119 Hypertensive heart disease without heart failure: Secondary | ICD-10-CM | POA: Diagnosis not present

## 2019-04-27 DIAGNOSIS — M315 Giant cell arteritis with polymyalgia rheumatica: Secondary | ICD-10-CM | POA: Diagnosis not present

## 2019-04-27 DIAGNOSIS — H547 Unspecified visual loss: Secondary | ICD-10-CM | POA: Diagnosis not present

## 2019-05-01 DIAGNOSIS — M315 Giant cell arteritis with polymyalgia rheumatica: Secondary | ICD-10-CM | POA: Diagnosis not present

## 2019-05-01 DIAGNOSIS — I119 Hypertensive heart disease without heart failure: Secondary | ICD-10-CM | POA: Diagnosis not present

## 2019-05-01 DIAGNOSIS — I700 Atherosclerosis of aorta: Secondary | ICD-10-CM | POA: Diagnosis not present

## 2019-05-01 DIAGNOSIS — I2510 Atherosclerotic heart disease of native coronary artery without angina pectoris: Secondary | ICD-10-CM | POA: Diagnosis not present

## 2019-05-01 DIAGNOSIS — I4891 Unspecified atrial fibrillation: Secondary | ICD-10-CM | POA: Diagnosis not present

## 2019-05-01 DIAGNOSIS — H547 Unspecified visual loss: Secondary | ICD-10-CM | POA: Diagnosis not present

## 2019-05-04 DIAGNOSIS — I2510 Atherosclerotic heart disease of native coronary artery without angina pectoris: Secondary | ICD-10-CM | POA: Diagnosis not present

## 2019-05-04 DIAGNOSIS — I119 Hypertensive heart disease without heart failure: Secondary | ICD-10-CM | POA: Diagnosis not present

## 2019-05-04 DIAGNOSIS — H547 Unspecified visual loss: Secondary | ICD-10-CM | POA: Diagnosis not present

## 2019-05-04 DIAGNOSIS — I4891 Unspecified atrial fibrillation: Secondary | ICD-10-CM | POA: Diagnosis not present

## 2019-05-04 DIAGNOSIS — M315 Giant cell arteritis with polymyalgia rheumatica: Secondary | ICD-10-CM | POA: Diagnosis not present

## 2019-05-04 DIAGNOSIS — I700 Atherosclerosis of aorta: Secondary | ICD-10-CM | POA: Diagnosis not present

## 2019-05-09 ENCOUNTER — Ambulatory Visit: Payer: TRICARE For Life (TFL) | Admitting: Physician Assistant

## 2019-05-09 NOTE — Progress Notes (Deleted)
  Cardiology Office Note:    Date:  05/09/2019   ID:  Matthew Hickman, DOB February 27, 1925, MRN [id number]  PCP:  Matthew Gravel, MD  Cardiologist:  No primary care provider on file. *** Electrophysiologist:  None   Referring MD: Matthew Gravel, MD   Chief Complaint:  No chief complaint on file.    Patient Profile:    Matthew Hickman is a 86 y.o. male with:   Sinus tachycardia w/ PACs, PVCs   ?Afib w/ RVR during admx in 3/20201 [facility] Cell Arteritis) >> felt to be Sinus Tach and not AFib (?ectopic atrial rhythm)   Giant cell arteritis   Admx 04/2019; Bx + for GCA; High dose steroids   Melanoma s/p surgery in 01/2019  Hypertension   Bilateral clubfoot s/p surgery  Protein Calorie Malnutrition   Normocytic anemia   Prior CV studies: Echocardiogram 04/05/2019 EF 55-60, no RWMA, Gr 1 DD, RVSP 42.2 mmHg, trivial MR, mild dilation of ascending Aorta (40 mm)  History of Present Illness:    [name] ***  The DICTATELATER SmartLink is not supported in this context. ***   Past Medical History:  Diagnosis Date  . Cancer ([place])    skin  . Hypertension     Current Medications: No outpatient medications have been marked as taking for the 05/09/19 encounter (Appointment) with Matthew Hickman T, PA-C.     Allergies:   Tape   Social History   Tobacco Use  . Smoking status: Never Smoker  . Smokeless tobacco: Never Used  Substance Use Topics  . Alcohol use: Yes    Comment: 1 glass of bourbon per night  . Drug use: Never     Family Hx: The patient's family history includes Heart attack in his father; Heart failure in his father.  ROS   EKGs/Labs/Other Test Reviewed:    EKG:  EKG is *** ordered today.  The ekg ordered today demonstrates ***  Recent Labs: 04/04/2019: ALT 22 04/05/2019: TSH 1.629 04/08/2019: Magnesium 2.4 04/10/2019: BUN 48; Creatinine, Ser 0.97; Hemoglobin 10.3; Platelets 208; Potassium 5.0; Sodium 144   Recent Lipid Panel No results found for: CHOL, TRIG, HDL, CHOLHDL,  LDLCALC, LDLDIRECT  Physical Exam:    VS:  There were no vitals taken for this visit.    Wt Readings from Last 3 Encounters:  04/10/19 128 lb 3.2 oz (58.2 kg)  01/30/19 130 lb (59 kg)  01/24/19 132 lb (59.9 kg)     Physical Exam ***  ASSESSMENT & PLAN:    ***  Dispo:  No follow-ups on file.   Medication Adjustments/Labs and Tests Ordered: Current medicines are reviewed at length with the patient today.  Concerns regarding medicines are outlined above.  Tests Ordered: No orders of the defined types were placed in this encounter.  Medication Changes: No orders of the defined types were placed in this encounter.   Signed, Matthew Dopp, PA-C  05/09/2019 7:41 AM    [facility] [place], [place], [place]  [address] Phone: [phone]; Fax: [phone]

## 2019-05-11 DIAGNOSIS — I2510 Atherosclerotic heart disease of native coronary artery without angina pectoris: Secondary | ICD-10-CM | POA: Diagnosis not present

## 2019-05-11 DIAGNOSIS — H547 Unspecified visual loss: Secondary | ICD-10-CM | POA: Diagnosis not present

## 2019-05-11 DIAGNOSIS — I119 Hypertensive heart disease without heart failure: Secondary | ICD-10-CM | POA: Diagnosis not present

## 2019-05-11 DIAGNOSIS — M315 Giant cell arteritis with polymyalgia rheumatica: Secondary | ICD-10-CM | POA: Diagnosis not present

## 2019-05-11 DIAGNOSIS — I4891 Unspecified atrial fibrillation: Secondary | ICD-10-CM | POA: Diagnosis not present

## 2019-05-11 DIAGNOSIS — I700 Atherosclerosis of aorta: Secondary | ICD-10-CM | POA: Diagnosis not present

## 2019-05-17 DIAGNOSIS — M315 Giant cell arteritis with polymyalgia rheumatica: Secondary | ICD-10-CM | POA: Diagnosis not present

## 2019-05-17 DIAGNOSIS — R636 Underweight: Secondary | ICD-10-CM | POA: Diagnosis not present

## 2019-05-17 DIAGNOSIS — H547 Unspecified visual loss: Secondary | ICD-10-CM | POA: Diagnosis not present

## 2019-05-17 DIAGNOSIS — J9811 Atelectasis: Secondary | ICD-10-CM | POA: Diagnosis not present

## 2019-05-17 DIAGNOSIS — E43 Unspecified severe protein-calorie malnutrition: Secondary | ICD-10-CM | POA: Diagnosis not present

## 2019-05-17 DIAGNOSIS — R319 Hematuria, unspecified: Secondary | ICD-10-CM | POA: Diagnosis not present

## 2019-05-17 DIAGNOSIS — Z9181 History of falling: Secondary | ICD-10-CM | POA: Diagnosis not present

## 2019-05-17 DIAGNOSIS — D320 Benign neoplasm of cerebral meninges: Secondary | ICD-10-CM | POA: Diagnosis not present

## 2019-05-17 DIAGNOSIS — D649 Anemia, unspecified: Secondary | ICD-10-CM | POA: Diagnosis not present

## 2019-05-17 DIAGNOSIS — I2510 Atherosclerotic heart disease of native coronary artery without angina pectoris: Secondary | ICD-10-CM | POA: Diagnosis not present

## 2019-05-17 DIAGNOSIS — I119 Hypertensive heart disease without heart failure: Secondary | ICD-10-CM | POA: Diagnosis not present

## 2019-05-17 DIAGNOSIS — Z85820 Personal history of malignant melanoma of skin: Secondary | ICD-10-CM | POA: Diagnosis not present

## 2019-05-17 DIAGNOSIS — N400 Enlarged prostate without lower urinary tract symptoms: Secondary | ICD-10-CM | POA: Diagnosis not present

## 2019-05-17 DIAGNOSIS — Z681 Body mass index (BMI) 19 or less, adult: Secondary | ICD-10-CM | POA: Diagnosis not present

## 2019-05-17 DIAGNOSIS — I4891 Unspecified atrial fibrillation: Secondary | ICD-10-CM | POA: Diagnosis not present

## 2019-05-17 DIAGNOSIS — Z8505 Personal history of malignant neoplasm of liver: Secondary | ICD-10-CM | POA: Diagnosis not present

## 2019-05-17 DIAGNOSIS — Z7952 Long term (current) use of systemic steroids: Secondary | ICD-10-CM | POA: Diagnosis not present

## 2019-05-17 DIAGNOSIS — I700 Atherosclerosis of aorta: Secondary | ICD-10-CM | POA: Diagnosis not present

## 2019-05-18 DIAGNOSIS — I4891 Unspecified atrial fibrillation: Secondary | ICD-10-CM | POA: Diagnosis not present

## 2019-05-18 DIAGNOSIS — H547 Unspecified visual loss: Secondary | ICD-10-CM | POA: Diagnosis not present

## 2019-05-18 DIAGNOSIS — M315 Giant cell arteritis with polymyalgia rheumatica: Secondary | ICD-10-CM | POA: Diagnosis not present

## 2019-05-18 DIAGNOSIS — I700 Atherosclerosis of aorta: Secondary | ICD-10-CM | POA: Diagnosis not present

## 2019-05-18 DIAGNOSIS — I2510 Atherosclerotic heart disease of native coronary artery without angina pectoris: Secondary | ICD-10-CM | POA: Diagnosis not present

## 2019-05-18 DIAGNOSIS — I119 Hypertensive heart disease without heart failure: Secondary | ICD-10-CM | POA: Diagnosis not present

## 2019-05-19 DIAGNOSIS — M316 Other giant cell arteritis: Secondary | ICD-10-CM | POA: Diagnosis not present

## 2019-05-25 DIAGNOSIS — I4891 Unspecified atrial fibrillation: Secondary | ICD-10-CM | POA: Diagnosis not present

## 2019-05-25 DIAGNOSIS — I700 Atherosclerosis of aorta: Secondary | ICD-10-CM | POA: Diagnosis not present

## 2019-05-25 DIAGNOSIS — H547 Unspecified visual loss: Secondary | ICD-10-CM | POA: Diagnosis not present

## 2019-05-25 DIAGNOSIS — I2510 Atherosclerotic heart disease of native coronary artery without angina pectoris: Secondary | ICD-10-CM | POA: Diagnosis not present

## 2019-05-25 DIAGNOSIS — M315 Giant cell arteritis with polymyalgia rheumatica: Secondary | ICD-10-CM | POA: Diagnosis not present

## 2019-05-25 DIAGNOSIS — I119 Hypertensive heart disease without heart failure: Secondary | ICD-10-CM | POA: Diagnosis not present

## 2019-05-29 ENCOUNTER — Other Ambulatory Visit: Payer: Self-pay

## 2019-05-29 ENCOUNTER — Emergency Department (HOSPITAL_COMMUNITY): Payer: Medicare Other

## 2019-05-29 ENCOUNTER — Inpatient Hospital Stay (HOSPITAL_COMMUNITY)
Admission: EM | Admit: 2019-05-29 | Discharge: 2019-06-07 | DRG: 309 | Disposition: A | Discharge status: Skilled Nursing Facility | Payer: Medicare Other | Attending: Internal Medicine | Admitting: Internal Medicine

## 2019-05-29 ENCOUNTER — Encounter (HOSPITAL_COMMUNITY): Payer: Self-pay

## 2019-05-29 DIAGNOSIS — H539 Unspecified visual disturbance: Secondary | ICD-10-CM | POA: Diagnosis not present

## 2019-05-29 DIAGNOSIS — R531 Weakness: Secondary | ICD-10-CM | POA: Diagnosis not present

## 2019-05-29 DIAGNOSIS — Z20822 Contact with and (suspected) exposure to covid-19: Secondary | ICD-10-CM | POA: Diagnosis present

## 2019-05-29 DIAGNOSIS — D649 Anemia, unspecified: Secondary | ICD-10-CM | POA: Diagnosis not present

## 2019-05-29 DIAGNOSIS — I4891 Unspecified atrial fibrillation: Secondary | ICD-10-CM | POA: Diagnosis not present

## 2019-05-29 DIAGNOSIS — I4819 Other persistent atrial fibrillation: Secondary | ICD-10-CM | POA: Diagnosis not present

## 2019-05-29 DIAGNOSIS — Z85828 Personal history of other malignant neoplasm of skin: Secondary | ICD-10-CM

## 2019-05-29 DIAGNOSIS — F1011 Alcohol abuse, in remission: Secondary | ICD-10-CM | POA: Diagnosis present

## 2019-05-29 DIAGNOSIS — Z91048 Other nonmedicinal substance allergy status: Secondary | ICD-10-CM | POA: Diagnosis not present

## 2019-05-29 DIAGNOSIS — D62 Acute posthemorrhagic anemia: Secondary | ICD-10-CM | POA: Diagnosis not present

## 2019-05-29 DIAGNOSIS — S0990XA Unspecified injury of head, initial encounter: Secondary | ICD-10-CM | POA: Diagnosis not present

## 2019-05-29 DIAGNOSIS — N400 Enlarged prostate without lower urinary tract symptoms: Secondary | ICD-10-CM | POA: Diagnosis present

## 2019-05-29 DIAGNOSIS — D696 Thrombocytopenia, unspecified: Secondary | ICD-10-CM | POA: Diagnosis not present

## 2019-05-29 DIAGNOSIS — I10 Essential (primary) hypertension: Secondary | ICD-10-CM | POA: Diagnosis present

## 2019-05-29 DIAGNOSIS — R0600 Dyspnea, unspecified: Secondary | ICD-10-CM

## 2019-05-29 DIAGNOSIS — M316 Other giant cell arteritis: Secondary | ICD-10-CM | POA: Diagnosis present

## 2019-05-29 DIAGNOSIS — W1830XA Fall on same level, unspecified, initial encounter: Secondary | ICD-10-CM | POA: Diagnosis present

## 2019-05-29 DIAGNOSIS — I340 Nonrheumatic mitral (valve) insufficiency: Secondary | ICD-10-CM | POA: Diagnosis not present

## 2019-05-29 DIAGNOSIS — I9789 Other postprocedural complications and disorders of the circulatory system, not elsewhere classified: Secondary | ICD-10-CM | POA: Diagnosis not present

## 2019-05-29 DIAGNOSIS — Y848 Other medical procedures as the cause of abnormal reaction of the patient, or of later complication, without mention of misadventure at the time of the procedure: Secondary | ICD-10-CM | POA: Diagnosis not present

## 2019-05-29 DIAGNOSIS — Z03818 Encounter for observation for suspected exposure to other biological agents ruled out: Secondary | ICD-10-CM | POA: Diagnosis not present

## 2019-05-29 DIAGNOSIS — I484 Atypical atrial flutter: Secondary | ICD-10-CM | POA: Diagnosis not present

## 2019-05-29 DIAGNOSIS — B965 Pseudomonas (aeruginosa) (mallei) (pseudomallei) as the cause of diseases classified elsewhere: Secondary | ICD-10-CM | POA: Diagnosis present

## 2019-05-29 DIAGNOSIS — W19XXXA Unspecified fall, initial encounter: Secondary | ICD-10-CM | POA: Diagnosis not present

## 2019-05-29 DIAGNOSIS — R52 Pain, unspecified: Secondary | ICD-10-CM | POA: Diagnosis not present

## 2019-05-29 DIAGNOSIS — R0902 Hypoxemia: Secondary | ICD-10-CM | POA: Diagnosis not present

## 2019-05-29 DIAGNOSIS — I4892 Unspecified atrial flutter: Principal | ICD-10-CM | POA: Insufficient documentation

## 2019-05-29 DIAGNOSIS — Z85820 Personal history of malignant melanoma of skin: Secondary | ICD-10-CM | POA: Diagnosis not present

## 2019-05-29 DIAGNOSIS — W19XXXD Unspecified fall, subsequent encounter: Secondary | ICD-10-CM | POA: Diagnosis not present

## 2019-05-29 DIAGNOSIS — R609 Edema, unspecified: Secondary | ICD-10-CM

## 2019-05-29 DIAGNOSIS — D509 Iron deficiency anemia, unspecified: Secondary | ICD-10-CM | POA: Diagnosis present

## 2019-05-29 DIAGNOSIS — I480 Paroxysmal atrial fibrillation: Principal | ICD-10-CM | POA: Diagnosis present

## 2019-05-29 DIAGNOSIS — C439 Malignant melanoma of skin, unspecified: Secondary | ICD-10-CM | POA: Diagnosis not present

## 2019-05-29 DIAGNOSIS — D539 Nutritional anemia, unspecified: Secondary | ICD-10-CM | POA: Diagnosis present

## 2019-05-29 DIAGNOSIS — N390 Urinary tract infection, site not specified: Secondary | ICD-10-CM | POA: Diagnosis present

## 2019-05-29 DIAGNOSIS — R079 Chest pain, unspecified: Secondary | ICD-10-CM | POA: Diagnosis not present

## 2019-05-29 DIAGNOSIS — H9193 Unspecified hearing loss, bilateral: Secondary | ICD-10-CM | POA: Diagnosis not present

## 2019-05-29 DIAGNOSIS — L03114 Cellulitis of left upper limb: Secondary | ICD-10-CM | POA: Diagnosis not present

## 2019-05-29 DIAGNOSIS — D6959 Other secondary thrombocytopenia: Secondary | ICD-10-CM | POA: Diagnosis not present

## 2019-05-29 DIAGNOSIS — R410 Disorientation, unspecified: Secondary | ICD-10-CM | POA: Diagnosis not present

## 2019-05-29 DIAGNOSIS — T461X5A Adverse effect of calcium-channel blockers, initial encounter: Secondary | ICD-10-CM | POA: Diagnosis not present

## 2019-05-29 DIAGNOSIS — R000 Tachycardia, unspecified: Secondary | ICD-10-CM | POA: Diagnosis not present

## 2019-05-29 DIAGNOSIS — I959 Hypotension, unspecified: Secondary | ICD-10-CM | POA: Diagnosis not present

## 2019-05-29 DIAGNOSIS — E43 Unspecified severe protein-calorie malnutrition: Secondary | ICD-10-CM | POA: Diagnosis not present

## 2019-05-29 DIAGNOSIS — R0602 Shortness of breath: Secondary | ICD-10-CM | POA: Diagnosis not present

## 2019-05-29 DIAGNOSIS — M7989 Other specified soft tissue disorders: Secondary | ICD-10-CM | POA: Diagnosis not present

## 2019-05-29 DIAGNOSIS — M2550 Pain in unspecified joint: Secondary | ICD-10-CM | POA: Diagnosis not present

## 2019-05-29 DIAGNOSIS — L02414 Cutaneous abscess of left upper limb: Secondary | ICD-10-CM | POA: Diagnosis not present

## 2019-05-29 DIAGNOSIS — Z7401 Bed confinement status: Secondary | ICD-10-CM | POA: Diagnosis not present

## 2019-05-29 DIAGNOSIS — H9190 Unspecified hearing loss, unspecified ear: Secondary | ICD-10-CM | POA: Diagnosis present

## 2019-05-29 DIAGNOSIS — Z8249 Family history of ischemic heart disease and other diseases of the circulatory system: Secondary | ICD-10-CM | POA: Diagnosis not present

## 2019-05-29 DIAGNOSIS — S51012A Laceration without foreign body of left elbow, initial encounter: Secondary | ICD-10-CM | POA: Diagnosis not present

## 2019-05-29 DIAGNOSIS — I491 Atrial premature depolarization: Secondary | ICD-10-CM | POA: Diagnosis not present

## 2019-05-29 DIAGNOSIS — I471 Supraventricular tachycardia: Secondary | ICD-10-CM | POA: Diagnosis not present

## 2019-05-29 DIAGNOSIS — S299XXA Unspecified injury of thorax, initial encounter: Secondary | ICD-10-CM | POA: Diagnosis not present

## 2019-05-29 LAB — BASIC METABOLIC PANEL
Anion gap: 12 (ref 5–15)
BUN: 33 mg/dL — ABNORMAL HIGH (ref 8–23)
CO2: 25 mmol/L (ref 22–32)
Calcium: 8.5 mg/dL — ABNORMAL LOW (ref 8.9–10.3)
Chloride: 105 mmol/L (ref 98–111)
Creatinine, Ser: 0.95 mg/dL (ref 0.61–1.24)
GFR calc Af Amer: >60 mL/min (ref >60)
GFR calc non Af Amer: >60 mL/min (ref >60)
Glucose, Bld: 96 mg/dL (ref 70–99)
Potassium: 3.9 mmol/L (ref 3.5–5.1)
Sodium: 142 mmol/L (ref 135–145)

## 2019-05-29 LAB — CBC
HCT: 31.1 % — ABNORMAL LOW (ref 39.0–52.0)
Hemoglobin: 10.2 g/dL — ABNORMAL LOW (ref 13.0–17.0)
MCH: 34 pg (ref 26.0–34.0)
MCHC: 32.8 g/dL (ref 30.0–36.0)
MCV: 103.7 fL — ABNORMAL HIGH (ref 80.0–100.0)
Platelets: 68 10*3/uL — ABNORMAL LOW (ref 150–400)
RBC: 3 MIL/uL — ABNORMAL LOW (ref 4.22–5.81)
RDW: 20.1 % — ABNORMAL HIGH (ref 11.5–15.5)
WBC: 10.2 10*3/uL (ref 4.0–10.5)
nRBC: 0.3 % — ABNORMAL HIGH (ref 0.0–0.2)

## 2019-05-29 LAB — RESPIRATORY PANEL BY RT PCR (FLU A&B, COVID)
Influenza A by PCR: NEGATIVE
Influenza B by PCR: NEGATIVE
SARS Coronavirus 2 by RT PCR: NEGATIVE

## 2019-05-29 LAB — URINALYSIS, ROUTINE W REFLEX MICROSCOPIC
Bilirubin Urine: NEGATIVE
Glucose, UA: NEGATIVE mg/dL
Ketones, ur: NEGATIVE mg/dL
Nitrite: NEGATIVE
Protein, ur: 30 mg/dL — AB
Specific Gravity, Urine: 1.015 (ref 1.005–1.030)
WBC, UA: >50 WBC/hpf — ABNORMAL HIGH (ref 0–5)
pH: 7 (ref 5.0–8.0)

## 2019-05-29 LAB — CBG MONITORING, ED: Glucose-Capillary: 218 mg/dL — ABNORMAL HIGH (ref 70–99)

## 2019-05-29 MED ORDER — HEPARIN SODIUM (PORCINE) 5000 UNIT/ML IJ SOLN
5000.0000 [IU] | Freq: Three times a day (TID) | INTRAMUSCULAR | Status: DC
  Administered 2019-05-29: 5000 [IU] via SUBCUTANEOUS
  Filled 2019-05-29: qty 1

## 2019-05-29 MED ORDER — TERAZOSIN HCL 1 MG PO CAPS
1.0000 mg | ORAL_CAPSULE | Freq: Every day | ORAL | Status: DC
  Administered 2019-05-29 – 2019-06-06 (×9): 1 mg via ORAL
  Filled 2019-05-29 (×11): qty 1

## 2019-05-29 MED ORDER — METOPROLOL TARTRATE 25 MG PO TABS
25.0000 mg | ORAL_TABLET | Freq: Two times a day (BID) | ORAL | Status: DC
  Administered 2019-05-29: 25 mg via ORAL
  Filled 2019-05-29: qty 1

## 2019-05-29 MED ORDER — ONDANSETRON HCL 4 MG/2ML IJ SOLN: 4.0000 mg | Freq: Four times a day (QID) | INTRAMUSCULAR | Status: DC | PRN

## 2019-05-29 MED ORDER — METOPROLOL TARTRATE 25 MG PO TABS
25.0000 mg | ORAL_TABLET | Freq: Once | ORAL | Status: DC
  Filled 2019-05-29: qty 1

## 2019-05-29 MED ORDER — SODIUM CHLORIDE 0.9 % IV SOLN: 250.0000 mL | INTRAVENOUS | Status: DC | PRN

## 2019-05-29 MED ORDER — ENSURE ENLIVE PO LIQD
237.0000 mL | Freq: Three times a day (TID) | ORAL | Status: DC
  Administered 2019-05-29 – 2019-06-07 (×22): 237 mL via ORAL
  Filled 2019-05-29: qty 237

## 2019-05-29 MED ORDER — SODIUM CHLORIDE 0.9 % IV SOLN
1.0000 g | INTRAVENOUS | Status: DC
  Administered 2019-05-30 – 2019-06-01 (×3): 1 g via INTRAVENOUS
  Filled 2019-05-29 (×3): qty 10

## 2019-05-29 MED ORDER — ACETAMINOPHEN 325 MG PO TABS: 650.0000 mg | ORAL_TABLET | ORAL | Status: DC | PRN

## 2019-05-29 MED ORDER — SODIUM CHLORIDE 0.9 % IV SOLN
2.0000 g | Freq: Once | INTRAVENOUS | Status: AC
  Administered 2019-05-29: 16:00:00 2 g via INTRAVENOUS
  Filled 2019-05-29: qty 20

## 2019-05-29 MED ORDER — PREDNISONE 20 MG PO TABS
30.0000 mg | ORAL_TABLET | Freq: Two times a day (BID) | ORAL | Status: DC
  Administered 2019-05-29 – 2019-06-07 (×19): 30 mg via ORAL
  Filled 2019-05-29 (×13): qty 1
  Filled 2019-05-29: qty 2
  Filled 2019-05-29 (×5): qty 1

## 2019-05-29 MED ORDER — ADULT MULTIVITAMIN W/MINERALS CH
1.0000 | ORAL_TABLET | Freq: Every day | ORAL | Status: DC
  Administered 2019-05-29 – 2019-06-07 (×10): 1 via ORAL
  Filled 2019-05-29 (×10): qty 1

## 2019-05-29 MED ORDER — SODIUM CHLORIDE 0.9% FLUSH: 3.0000 mL | INTRAVENOUS | Status: DC | PRN

## 2019-05-29 MED ORDER — PANTOPRAZOLE SODIUM 20 MG PO TBEC
20.0000 mg | DELAYED_RELEASE_TABLET | Freq: Every day | ORAL | Status: DC
  Administered 2019-05-29 – 2019-06-07 (×10): 20 mg via ORAL
  Filled 2019-05-29 (×10): qty 1

## 2019-05-29 MED ORDER — METOPROLOL TARTRATE 50 MG PO TABS
50.0000 mg | ORAL_TABLET | Freq: Two times a day (BID) | ORAL | Status: DC
  Administered 2019-05-29 – 2019-05-31 (×5): 50 mg via ORAL
  Filled 2019-05-29 (×2): qty 1
  Filled 2019-05-29: qty 2
  Filled 2019-05-29 (×3): qty 1

## 2019-05-29 MED ORDER — SODIUM CHLORIDE 0.9% FLUSH
3.0000 mL | Freq: Two times a day (BID) | INTRAVENOUS | Status: DC
  Administered 2019-05-30 – 2019-06-07 (×14): 3 mL via INTRAVENOUS

## 2019-05-29 MED ORDER — DILTIAZEM HCL-DEXTROSE 125-5 MG/125ML-% IV SOLN (PREMIX)
5.0000 mg/h | INTRAVENOUS | Status: DC
  Administered 2019-05-29: 15 mg/h via INTRAVENOUS
  Administered 2019-05-29: 13:00:00 5 mg/h via INTRAVENOUS
  Filled 2019-05-29 (×3): qty 125

## 2019-05-29 MED ORDER — DILTIAZEM LOAD VIA INFUSION
10.0000 mg | Freq: Once | INTRAVENOUS | Status: AC
  Administered 2019-05-29: 10 mg via INTRAVENOUS
  Filled 2019-05-29: qty 10

## 2019-05-29 MED ORDER — APIXABAN 5 MG PO TABS
5.0000 mg | ORAL_TABLET | Freq: Two times a day (BID) | ORAL | Status: DC
  Administered 2019-05-29: 5 mg via ORAL
  Filled 2019-05-29 (×3): qty 1

## 2019-05-29 NOTE — ED Notes (Signed)
Wife, peggy and niece [name] would like an update [phone]

## 2019-05-29 NOTE — H&P (Addendum)
History and Physical    Matthew Hickman [id number] DOB: Oct 11, 1925 DOA: 05/29/2019  PCP: [name], MD   Patient coming from: Home  I have personally briefly reviewed patient's old medical records in [place]  Chief Complaint: I fell  HPI: Matthew Hickman is a 86 y.o. male with medical history significant of hypertension, questionable paroxysmal A. fib, newly diagnosed giant cell arteritis with bilateral vision impairment on steroid, hard hearing, presented with fall.  Baseline patient has been healthy active to about 2 months ago developed this deterioration of vision.  Over the weekend, patient started to feel weak generalized, and today he fell while in the bathroom, and bumped his head to the wall, but he denied any lightheadedness or palpitations or chest pains were short of breath before this happened, and he denied any loss of consciousness or worsening of his vision.  Last month, patient was diagnosed with giant cell arteritis, and has been on high-dose of steroid through the month, but patient feel very little improvement with vision, compared to last month. He can only see light and silhouette of big object such as TV set and ceiling light.  Last month on same admission he had episode of A. Fib?, but eventually the diagnosis of A. fib not established, and patient sent home with p.o. Cardizem and metoprolol.  ED Course: CT head shows chronic changes, and patient was found in rapid A. fib.  Review of Systems: As per HPI otherwise 10 point review of systems negative.    Past Medical History:  Diagnosis Date  . Cancer ([place])    skin  . Hypertension     Past Surgical History:  Procedure Laterality Date  . APPENDECTOMY    . ARTERY BIOPSY Bilateral 04/07/2019   Procedure: BIOPSY TEMPORAL ARTERY;  Surgeon: Jesusita Oka, MD;  Location: [place];  Service: General;  Laterality: Bilateral;  . MELANOMA EXCISION Bilateral 01/30/2019   Procedure: WIDE LOCAL EXCISION WITH  ADVANCEMENT FLAP CLOSURE OF MELANOMA LEFT NECK AND RIGHT BACK;  Surgeon: [name], MD;  Location: [place];  Service: General;  Laterality: Bilateral;     reports that he has never smoked. He has never used smokeless tobacco. He reports current alcohol use. He reports that he does not use drugs.  Allergies  Allergen Reactions  . Tape Other (See Comments)    PATIENT'S SKIN IS THIN AND IT TEARS EASILY    Family History  Problem Relation Age of Onset  . Heart attack Father        Died at age 38  . Heart failure Father      Prior to Admission medications   Medication Sig Start Date End Date Taking? Authorizing Provider  diltiazem (CARDIZEM CD) 180 MG 24 hr capsule Take 1 capsule (180 mg total) by mouth daily. 04/11/19   Guilford Shi, MD  feeding supplement, ENSURE ENLIVE, (ENSURE ENLIVE) LIQD Take 237 mLs by mouth 3 (three) times daily between meals. 04/10/19   Guilford Shi, MD  metoprolol tartrate (LOPRESSOR) 25 MG tablet Take 25 mg by mouth 2 (two) times daily.  03/20/16   [provider]  Multiple Vitamin (MULTIVITAMIN WITH MINERALS) TABS tablet Take 1 tablet by mouth daily.    [provider]  pantoprazole (PROTONIX) 20 MG tablet Take 1 tablet (20 mg total) by mouth daily. 04/11/19   Guilford Shi, MD  terazosin (HYTRIN) 1 MG capsule Take 1 mg by mouth at bedtime.    [provider]  Physical Exam: Vitals:   05/29/19 1215 05/29/19 1230 05/29/19 1245 05/29/19 1300  BP: 117/74 119/83 124/84 (!) 150/71  Pulse: (!) 165 (!) 161 (!) 165 (!) 144  Resp: 19 16 19  (!) 34  Temp:      TempSrc:      SpO2: 99% 94% 98% 90%    Constitutional: NAD, calm, comfortable Vitals:   05/29/19 1215 05/29/19 1230 05/29/19 1245 05/29/19 1300  BP: 117/74 119/83 124/84 (!) 150/71  Pulse: (!) 165 (!) 161 (!) 165 (!) 144  Resp: 19 16 19  (!) 34  Temp:      TempSrc:      SpO2: 99% 94% 98% 90%   Eyes: PERRL, lids and conjunctivae normal ENMT: Mucous membranes are  moist. Posterior pharynx clear of any exudate or lesions.Normal dentition.  Neck: normal, supple, no masses, no thyromegaly Respiratory: clear to auscultation bilaterally, no wheezing, no crackles. Normal respiratory effort. No accessory muscle use.  Cardiovascular: Irregular heart rate, tachycardia, no murmurs / rubs / gallops. No extremity edema. 2+ pedal pulses. No carotid bruits.  Abdomen: no tenderness, no masses palpated. No hepatosplenomegaly. Bowel sounds positive.  Musculoskeletal: no clubbing / cyanosis. No joint deformity upper and lower extremities. Good ROM, no contractures. Normal muscle tone.  Skin: no rashes, lesions, ulcers. No induration Neurologic: CN 2-12 grossly intact. Sensation intact, DTR normal. Strength 5/5 in all 4.  Psychiatric: Normal judgment and insight. Alert and oriented x 3. Normal mood.   Labs on Admission: I have personally reviewed following labs and imaging studies  CBC: Recent Labs  Lab 05/29/19 0959  WBC 10.2  HGB 10.2*  HCT 31.1*  MCV 103.7*  PLT 68*   Basic Metabolic Panel: Recent Labs  Lab 05/29/19 0959  NA 142  K 3.9  CL 105  CO2 25  GLUCOSE 96  BUN 33*  CREATININE 0.95  CALCIUM 8.5*   GFR: CrCl cannot be calculated (Unknown ideal weight.). Liver Function Tests: No results for input(s): AST, ALT, ALKPHOS, BILITOT, PROT, ALBUMIN in the last 168 hours. No results for input(s): LIPASE, AMYLASE in the last 168 hours. No results for input(s): AMMONIA in the last 168 hours. Coagulation Profile: No results for input(s): INR, PROTIME in the last 168 hours. Cardiac Enzymes: No results for input(s): CKTOTAL, CKMB, CKMBINDEX, TROPONINI in the last 168 hours. BNP (last 3 results) No results for input(s): PROBNP in the last 8760 hours. HbA1C: No results for input(s): HGBA1C in the last 72 hours. CBG: No results for input(s): GLUCAP in the last 168 hours. Lipid Profile: No results for input(s): CHOL, HDL, LDLCALC, TRIG, CHOLHDL,  LDLDIRECT in the last 72 hours. Thyroid Function Tests: No results for input(s): TSH, T4TOTAL, FREET4, T3FREE, THYROIDAB in the last 72 hours. Anemia Panel: No results for input(s): VITAMINB12, FOLATE, FERRITIN, TIBC, IRON, RETICCTPCT in the last 72 hours. Urine analysis:    Component Value Date/Time   COLORURINE YELLOW 05/29/2019 1211   APPEARANCEUR HAZY (A) 05/29/2019 1211   LABSPEC 1.015 05/29/2019 1211   PHURINE 7.0 05/29/2019 1211   GLUCOSEU NEGATIVE 05/29/2019 1211   HGBUR SMALL (A) 05/29/2019 1211   BILIRUBINUR NEGATIVE 05/29/2019 1211   KETONESUR NEGATIVE 05/29/2019 1211   PROTEINUR 30 (A) 05/29/2019 1211   UROBILINOGEN 1.0 09/20/2010 1226   NITRITE NEGATIVE 05/29/2019 1211   LEUKOCYTESUR LARGE (A) 05/29/2019 1211    Radiological Exams on Admission: DG Elbow Complete Left  Result Date: 05/29/2019 CLINICAL DATA:  Fall, laceration to left elbow EXAM: LEFT ELBOW - COMPLETE 3+  VIEW COMPARISON:  None. FINDINGS: There is no evidence of fracture, dislocation, or joint effusion. There is no evidence of arthropathy or other focal bone abnormality. Soft tissues are unremarkable. IMPRESSION: Negative. Electronically Signed   By: Rolm Baptise M.D.   On: 05/29/2019 10:54   CT Head Wo Contrast  Result Date: 05/29/2019 CLINICAL DATA:  Head trauma.  Mechanical fall. EXAM: CT HEAD WITHOUT CONTRAST TECHNIQUE: Contiguous axial images were obtained from the base of the skull through the vertex without intravenous contrast. COMPARISON:  04/07/2019 MRI head. FINDINGS: Brain: No acute infarct or intracranial hemorrhage. Bilateral basal ganglia calcifications. Re-demonstration of small meningiomas overlying the right frontal and parietal convexities. No midline shift or extra-axial fluid collection. Vascular: No hyperdense vessel. Bilateral carotid siphon and V4 segment atherosclerotic calcifications. Skull: Negative for fracture or focal lesion. Sinuses/Orbits: Bilateral lens replacement. No mastoid  effusion. Small left maxillary sinus mucous retention cyst. Other: None. IMPRESSION: No acute intracranial process. Mild cerebral atrophy. Unchanged small right cerebral convexity meningiomas. Electronically Signed   By: Primitivo Gauze M.D.   On: 05/29/2019 10:57    EKG: Independently reviewed.  A flutter with occasional 2-1 transduction  Assessment/Plan Active Problems:   A-fib (HCC)  A. fib with RVR, with 3-4:1 transduction On Cardizem drip, discussed with on-call cardiologist, who will evaluate patient today Overall poor candidate for systemic anticoagulation given his deterioration of vision, unless he will be living in the supervised condition in the future. Seems like there is a AV blockage causing varying transduction and ventricle rate, consult cardiology to help manage. K 3.9, check Mg  UTI With underlying BPH, Rocephin now Check PVR   Giant cell arteritis Discussed with patient niece, who witnessed patient overall condition deteriorated over last 2 months, and was a family gathering yesterday discussed about palliative care but no conclusion. Continue high dose of steroids, outpatient slow tapering as per ophthalmologist note from last admission PPI for GI prophylaxis  Moderate pulmonary hypertension Clinically seems euvolemic PRN diuresis  DVT prophylaxis: Heparin subcu  code Status: Full code Family Communication: Niece over the phone Disposition Plan: PT evaluation, likely will need more than 2 overnight hospital stay Consults called: Trish from cardiology Admission status: PCU for Cardizem drip   Lequita Halt MD [facility] Pager [phone]    05/29/2019, 1:22 PM

## 2019-05-29 NOTE — Consult Note (Addendum)
Cardiology Consultation:   Patient ID: Matthew Hickman MRN: [id number]; DOB: [birth year]  Admit date: 05/29/2019 Date of Consult: 05/29/2019  Primary Care Provider: [name], MD Primary Cardiologist: Matthew Mocha, MD  Primary Electrophysiologist:  None    Patient Profile:   Matthew Hickman is a 86 y.o. male with a hx of HTN, skin cancer and visual giant cell arteritis and what was thought to be a  Fib 04/05/19 but it was not thought to be possible atrial ectopy. who is being seen today for the evaluation of atrial fib at the request of Matthew Hickman.Marland Kitchen  History of Present Illness:   [name] with above hx and seen by our office for irregular HR but not atrial fib.  Was atrial ectopy.  He was placed on dilt and BB. No need for anticoagulation.   Echo 04/05/19 with EF 55 to 60%, normal LV function and G1 DD.  PA systolic pressure [id number]   Today presented by EMS for weakness and fall.  He had not had the dilt and metoprolol today.  He denies and chest pain or SOB.  He denies syncope, he fell going to [facility] and did hit his head.   No awareness of irregular HR   EKG:  The EKG was personally reviewed and demonstrates:  A fib with RVR at 142  No ST changes Telemetry:  Telemetry was personally reviewed and demonstrates:  Atrial flutter with occ PVC and fairly rate controlled.   Na 142, K+ 3.9, Cr 0.95  Hgb 10.2 HCT 31 plts 68 down from 208 in March CRP 13.1  Sed rate73 COVID neg  pCXR no acute process.  U/a + hgb   BP labile 150/71 to 104/83  Placed on dilt drip and rec'd his lopressor 25 for AM dose   Past Medical History:  Diagnosis Date  . Cancer ([place])    skin  . Hypertension     Past Surgical History:  Procedure Laterality Date  . APPENDECTOMY    . ARTERY BIOPSY Bilateral 04/07/2019   Procedure: BIOPSY TEMPORAL ARTERY;  Surgeon: Matthew Oka, MD;  Location: [facility];  Service: General;  Laterality: Bilateral;  . MELANOMA EXCISION Bilateral 01/30/2019   Procedure: WIDE LOCAL EXCISION  WITH ADVANCEMENT FLAP CLOSURE OF MELANOMA LEFT NECK AND RIGHT BACK;  Surgeon: Matthew Klein, MD;  Location: [facility];  Service: General;  Laterality: Bilateral;     Home Medications:  Prior to Admission medications   Medication Sig Start Date End Date Taking? Authorizing Provider  diltiazem (CARDIZEM CD) 180 MG 24 hr capsule Take 1 capsule (180 mg total) by mouth daily. 04/11/19  Yes Matthew Shi, MD  feeding supplement, ENSURE ENLIVE, (ENSURE ENLIVE) LIQD Take 237 mLs by mouth 3 (three) times daily between meals. Patient taking differently: Take 237 mLs by mouth 2 (two) times daily between meals.  04/10/19  Yes Matthew Shi, MD  metoprolol tartrate (LOPRESSOR) 25 MG tablet Take 25 mg by mouth 2 (two) times daily.  03/20/16  Yes [provider]  Multiple Vitamin (MULTIVITAMIN WITH MINERALS) TABS tablet Take 1 tablet by mouth daily.   Yes [provider]  pantoprazole (PROTONIX) 20 MG tablet Take 1 tablet (20 mg total) by mouth daily. 04/11/19  Yes Matthew Shi, MD  predniSONE (DELTASONE) 20 MG tablet Take 20-40 mg by mouth See admin instructions. Taking 40mg  daily until 06-01-19, then  decrease to 20mg  daily. 05/19/19  Yes [provider]  terazosin (HYTRIN) 1 MG capsule Take 1 mg by  mouth at bedtime.   Yes [provider]    Inpatient Medications: Scheduled Meds: . feeding supplement (ENSURE ENLIVE)  237 mL Oral TID BM  . heparin  5,000 Units Subcutaneous Q8H  . metoprolol tartrate  25 mg Oral BID  . multivitamin with minerals  1 tablet Oral Daily  . pantoprazole  20 mg Oral Daily  . predniSONE  30 mg Oral BID WC  . sodium chloride flush  3 mL Intravenous Q12H  . terazosin  1 mg Oral QHS   Continuous Infusions: . sodium chloride    . [START ON 05/30/2019] cefTRIAXone (ROCEPHIN)  IV    . cefTRIAXone (ROCEPHIN)  IV 2 g (05/29/19 1536)  . diltiazem (CARDIZEM) infusion 5 mg/hr (05/29/19 1245)   PRN Meds: sodium chloride, acetaminophen, ondansetron  (ZOFRAN) IV, sodium chloride flush  Allergies:    Allergies  Allergen Reactions  . Tape Other (See Comments)    PATIENT'S SKIN IS THIN AND IT TEARS EASILY    Social History:   Social History   Socioeconomic History  . Marital status: Married    Spouse name: Not on file  . Number of children: Not on file  . Years of education: Not on file  . Highest education level: Not on file  Occupational History  . Not on file  Tobacco Use  . Smoking status: Never Smoker  . Smokeless tobacco: Never Used  Substance and Sexual Activity  . Alcohol use: Yes    Comment: 1 glass of bourbon per night  . Drug use: Never  . Sexual activity: Not on file  Other Topics Concern  . Not on file  Social History Narrative  . Not on file   Social Determinants of Health   Financial Resource Strain:   . Difficulty of Paying Living Expenses:   Food Insecurity:   . Worried About Charity fundraiser in the Last Year:   . Arboriculturist in the Last Year:   Transportation Needs:   . Film/video editor (Medical):   Marland Kitchen Lack of Transportation (Non-Medical):   Physical Activity:   . Days of Exercise per Week:   . Minutes of Exercise per Session:   Stress:   . Feeling of Stress :   Social Connections:   . Frequency of Communication with Friends and Family:   . Frequency of Social Gatherings with Friends and Family:   . Attends Religious Services:   . Active Member of Clubs or Organizations:   . Attends Archivist Meetings:   Marland Kitchen Marital Status:   Intimate Partner Violence:   . Fear of Current or Ex-Partner:   . Emotionally Abused:   Marland Kitchen Physically Abused:   . Sexually Abused:     Family History:    Family History  Problem Relation Age of Onset  . Heart attack Father        Died at age 98  . Heart failure Father      ROS:  Please see the history of present illness.  General:no colds or fevers, no weight changes Skin:no rashes or ulcers HEENT:no blurred vision, no  congestion CV:see HPI PUL:see HPI GI:no diarrhea constipation or melena, no indigestion GU:no hematuria, no dysuria MS:no joint pain, no claudication Neuro:no syncope, no lightheadedness, fall he denies dizziness  Endo:no diabetes, no thyroid disease  All other ROS reviewed and negative.     Physical Exam/Data:   Vitals:   05/29/19 1300 05/29/19 1315 05/29/19 1330 05/29/19 1500  BP: Marland Kitchen)  150/71 (!) 158/52 114/73 104/83  Pulse: (!) 144 94 (!) 130 (!) 105  Resp: (!) 34 (!) 27 14   Temp:      TempSrc:      SpO2: 90% 97% 97%     Intake/Output Summary (Last 24 hours) at 05/29/2019 1604 Last data filed at 05/29/2019 1444 Gross per 24 hour  Intake --  Output 200 ml  Net -200 ml   Last 3 Weights 04/10/2019 04/09/2019 04/08/2019  Weight (lbs) 128 lb 3.2 oz 126 lb 12.8 oz 128 lb 1.6 oz  Weight (kg) 58.151 kg 57.516 kg 58.106 kg     There is no height or weight on file to calculate BMI.  General:  Frail male, in no acute distress  HEENT: normal though very hard of hearing  Lymph: no adenopathy Neck: no JVD Endocrine:  No thryomegaly Vascular: No carotid bruits; pedal pulses 2+ bilaterally  Cardiac:  irreg irreg; no murmur gallup or rub Lungs:  clear to auscultation bilaterally, no wheezing, rhonchi or rales  Abd: soft, nontender, no hepatomegaly  Ext: 1+ lower ext  edema Musculoskeletal:  No deformities, BUE and BLE strength normal and equal Skin: warm and dry  Neuro:  Alert and oriented X 3  MAE follows commnds no focal abnormalities noted Psych:  Normal affect    Relevant CV Studies: Echo 04/05/19 IMPRESSIONS    1. Left ventricular ejection fraction, by estimation, is 55 to 60%. The  left ventricle has normal function. The left ventricle has no regional  wall motion abnormalities. Left ventricular diastolic parameters are  consistent with Grade I diastolic  dysfunction (impaired relaxation).  2. Right ventricular systolic function is normal. The right ventricular  size is  normal. There is moderately elevated pulmonary artery systolic  pressure. The estimated right ventricular systolic pressure is [id number] mmHg.  3. The mitral valve is normal in structure and function. Trivial mitral  valve regurgitation. No evidence of mitral stenosis.  4. The aortic valve is tricuspid. Aortic valve regurgitation is not  visualized. No aortic stenosis is present.  5. Aortic dilatation noted. There is mild dilatation of the ascending  aorta measuring 40 mm.  6. The inferior vena cava is dilated in size with <50% respiratory  variability, suggesting right atrial pressure of 15 mmHg.   FINDINGS  Left Ventricle: Left ventricular ejection fraction, by estimation, is 55  to 60%. The left ventricle has normal function. The left ventricle has no  regional wall motion abnormalities. The left ventricular internal cavity  size was normal in size. There is  no left ventricular hypertrophy. Left ventricular diastolic parameters  are consistent with Grade I diastolic dysfunction (impaired relaxation).   Right Ventricle: The right ventricular size is normal. No increase in  right ventricular wall thickness. Right ventricular systolic function is  normal. There is moderately elevated pulmonary artery systolic pressure.  The tricuspid regurgitant velocity is  2.61 m/s, and with an assumed right atrial pressure of 15 mmHg, the  estimated right ventricular systolic pressure is [id number] mmHg.   Left Atrium: Left atrial size was normal in size.   Right Atrium: Right atrial size was normal in size.   Pericardium: There is no evidence of pericardial effusion.   Mitral Valve: The mitral valve is normal in structure and function.  Trivial mitral valve regurgitation. No evidence of mitral valve stenosis.   Tricuspid Valve: The tricuspid valve is normal in structure. Tricuspid  valve regurgitation is trivial.   Aortic Valve: The aortic valve is  tricuspid. Aortic valve regurgitation is  not  visualized. No aortic stenosis is present.   Pulmonic Valve: The pulmonic valve was normal in structure. Pulmonic valve  regurgitation is not visualized.   Aorta: The aortic root is normal in size and structure and aortic  dilatation noted. There is mild dilatation of the ascending aorta  measuring 40 mm.   Venous: The inferior vena cava is dilated in size with less than 50%  respiratory variability, suggesting right atrial pressure of 15 mmHg.   Laboratory Data:  High Sensitivity Troponin:  No results for input(s): TROPONINIHS in the last 720 hours.   Chemistry Recent Labs  Lab 05/29/19 0959  NA 142  K 3.9  CL 105  CO2 25  GLUCOSE 96  BUN 33*  CREATININE 0.95  CALCIUM 8.5*  GFRNONAA >60  GFRAA >60  ANIONGAP 12    No results for input(s): PROT, ALBUMIN, AST, ALT, ALKPHOS, BILITOT in the last 168 hours. Hematology Recent Labs  Lab 05/29/19 0959  WBC 10.2  RBC 3.00*  HGB 10.2*  HCT 31.1*  MCV 103.7*  MCH 34.0  MCHC 32.8  RDW 20.1*  PLT 68*   BNPNo results for input(s): BNP, PROBNP in the last 168 hours.  DDimer No results for input(s): DDIMER in the last 168 hours.   Radiology/Studies:  DG Elbow Complete Left  Result Date: 05/29/2019 CLINICAL DATA:  Fall, laceration to left elbow EXAM: LEFT ELBOW - COMPLETE 3+ VIEW COMPARISON:  None. FINDINGS: There is no evidence of fracture, dislocation, or joint effusion. There is no evidence of arthropathy or other focal bone abnormality. Soft tissues are unremarkable. IMPRESSION: Negative. Electronically Signed   By: Rolm Baptise M.D.   On: 05/29/2019 10:54   CT Head Wo Contrast  Result Date: 05/29/2019 CLINICAL DATA:  Head trauma.  Mechanical fall. EXAM: CT HEAD WITHOUT CONTRAST TECHNIQUE: Contiguous axial images were obtained from the base of the skull through the vertex without intravenous contrast. COMPARISON:  04/07/2019 MRI head. FINDINGS: Brain: No acute infarct or intracranial hemorrhage. Bilateral basal ganglia  calcifications. Re-demonstration of small meningiomas overlying the right frontal and parietal convexities. No midline shift or extra-axial fluid collection. Vascular: No hyperdense vessel. Bilateral carotid siphon and V4 segment atherosclerotic calcifications. Skull: Negative for fracture or focal lesion. Sinuses/Orbits: Bilateral lens replacement. No mastoid effusion. Small left maxillary sinus mucous retention cyst. Other: None. IMPRESSION: No acute intracranial process. Mild cerebral atrophy. Unchanged small right cerebral convexity meningiomas. Electronically Signed   By: Primitivo Gauze M.D.   On: 05/29/2019 10:57   DG CHEST PORT 1 VIEW  Result Date: 05/29/2019 CLINICAL DATA:  Pain following fall EXAM: PORTABLE CHEST 1 VIEW COMPARISON:  April 04, 2019 FINDINGS: There is atelectatic change in the left base. Lungs elsewhere are clear. Heart size and pulmonary vascularity are normal. No adenopathy. There is aortic atherosclerosis. No pneumothorax. No appreciable bone lesions. IMPRESSION: Left base atelectasis. Lungs elsewhere clear. Stable cardiac silhouette. No pneumothorax. Aortic Atherosclerosis (ICD10-I70.0). Electronically Signed   By: Lowella Grip III M.D.   On: 05/29/2019 13:27        NO CHEST PAIN  Assessment and Plan:   1. Atrial fib with RVR, no on dilt drip and po lopressor.  Rate in the 90s now  CHA2DS2VASc of 3.  Concern for drop in platelets. Will discuss with Dr. Sallyanne Kuster.  Last echo with normal EF. Recheck plts   Continue IV dilt and increase lopressor to 50 mg BID  2. visual giant cell arteritis.  On steroids 3. Moderate pulmonary HTN, euvolemic       For questions or updates, please contact [place] Please consult [email or web address] for contact info under     Signed, Cecilie Kicks, NP  05/29/2019 4:04 PM  I have seen and examined the patient along with Cecilie Kicks, NP .  I have reviewed the chart, notes and new data.  I agree with PA/NP's note.  Key new  complaints: He is very pleasant and cooperative, but also very hard of hearing and rather vague when asked precise questions about timing of events and frequency of problems, raising a little concerned about memory.  He reports having fallen 3-4 times in the last 7 or 8 weeks.  He denies losing consciousness with any of these falls, but claims that he stumbled or felt weak.  None of these have been associated with head impact or injury or serious bleeding.  He is not aware of palpitations.  He has not had true focal neurological events other than the issues related to his vision.  He bruises very easily, but reports that this is a longstanding problem, for years. Key examination changes: He has a few scattered ecchymoses but does not have petechiae.  His rhythm is irregular and slightly tachycardic but there are no rubs, gallops or murmurs.  He has excellent distal pulses.  No obvious focal neurological events other than hard of hearing. Key new findings / data: His ECG shows very coarse atrial fibrillation, probably some type of left atrial flutter.  His previous ECG showed sinus rhythm with very frequent premature atrial contractions.  He is currently on high-dose intravenous diltiazem and his ventricular rate is still in the low 100s.  He has also been taking oral metoprolol.  Recent echo showed the absence of significant structural heart abnormalities, other than borderline dilation of the ascending aorta and mild pulmonary artery hypertension.  Both atria were described as being normal in size.  Thrombocytopenia is a new problem.  PLAN: As opposed to March, when he has sinus rhythm with PACs, now he clearly has left atrial flutter or coarse atrial fibrillation. Ideally would start him on oral anticoagulants.  We should recheck his platelet counts to make sure that it is not artifactually low.  The bruising might be easily explained by chronic steroid therapy, rather than thrombocytopenia. Increase oral  beta-blocker dose.  With atypical/left atrial flutter, rate controlled might be quite challenging, in comparison with true atrial fibrillation.  Sanda Klein, MD, [place] [phone] 05/29/2019, 5:13 PM

## 2019-05-29 NOTE — ED Provider Notes (Signed)
[facility] EMERGENCY DEPARTMENT Provider Note   CSN: [id number] Arrival date & time: 05/29/19  [id number]     History Chief Complaint  Patient presents with  . Fall    Matthew Hickman is a 86 y.o. male.  HPI Patient presents after a fall.  Reportedly got up to go to the bathroom this morning.  Was unsteady which is somewhat baseline for him and fell.  Hit the back of his head and both elbows.  No loss consciousness.      Past Medical History:  Diagnosis Date  . Cancer ([place])    skin  . Hypertension     Patient Active Problem List   Diagnosis Date Noted  . Visual changes 04/10/2019  . Tachycardia 04/10/2019  . Normocytic anemia 04/10/2019  . Melanoma ([place]) 04/10/2019  . Penile bleeding 04/10/2019  . Protein-calorie malnutrition, severe 04/06/2019  . A-fib ([place]) 04/04/2019  . Giant cell arteritis ([place]) 04/04/2019  . Benign essential HTN 04/04/2019    Past Surgical History:  Procedure Laterality Date  . APPENDECTOMY    . ARTERY BIOPSY Bilateral 04/07/2019   Procedure: BIOPSY TEMPORAL ARTERY;  Surgeon: Jesusita Oka, MD;  Location: [place];  Service: General;  Laterality: Bilateral;  . MELANOMA EXCISION Bilateral 01/30/2019   Procedure: WIDE LOCAL EXCISION WITH ADVANCEMENT FLAP CLOSURE OF MELANOMA LEFT NECK AND RIGHT BACK;  Surgeon: [name], MD;  Location: [facility];  Service: General;  Laterality: Bilateral;       Family History  Problem Relation Age of Onset  . Heart attack Father        Died at age 91  . Heart failure Father     Social History   Tobacco Use  . Smoking status: Never Smoker  . Smokeless tobacco: Never Used  Substance Use Topics  . Alcohol use: Yes    Comment: 1 glass of bourbon per night  . Drug use: Never    Home Medications Prior to Admission medications   Medication Sig Start Date End Date Taking? Authorizing Provider  diltiazem (CARDIZEM CD) 180 MG 24 hr capsule Take 1 capsule (180 mg total) by mouth daily. 04/11/19    [place] Shi, MD  feeding supplement, ENSURE ENLIVE, (ENSURE ENLIVE) LIQD Take 237 mLs by mouth 3 (three) times daily between meals. 04/10/19   [place] Shi, MD  metoprolol tartrate (LOPRESSOR) 25 MG tablet Take 25 mg by mouth 2 (two) times daily.  03/20/16   [provider]  Multiple Vitamin (MULTIVITAMIN WITH MINERALS) TABS tablet Take 1 tablet by mouth daily.    [provider]  pantoprazole (PROTONIX) 20 MG tablet Take 1 tablet (20 mg total) by mouth daily. 04/11/19   [place] Shi, MD  terazosin (HYTRIN) 1 MG capsule Take 1 mg by mouth at bedtime.    [provider]    Allergies    Tape  Review of Systems   Review of Systems  Physical Exam Updated Vital Signs BP (!) 158/52   Pulse 94   Temp 97.7 F (36.5 C) (Oral)   Resp (!) 27   SpO2 97%   Physical Exam  ED Results / Procedures / Treatments   Labs (all labs ordered are listed, but only abnormal results are displayed) Labs Reviewed  URINALYSIS, ROUTINE W REFLEX MICROSCOPIC - Abnormal; Notable for the following components:      Result Value   APPearance HAZY (*)    Hgb urine dipstick SMALL (*)    Protein, ur 30 (*)  Leukocytes,Ua LARGE (*)    WBC, UA >50 (*)    Bacteria, UA FEW (*)    All other components within normal limits  BASIC METABOLIC PANEL - Abnormal; Notable for the following components:   BUN 33 (*)    Calcium 8.5 (*)    All other components within normal limits  CBC - Abnormal; Notable for the following components:   RBC 3.00 (*)    Hemoglobin 10.2 (*)    HCT 31.1 (*)    MCV 103.7 (*)    RDW 20.1 (*)    Platelets 68 (*)    nRBC 0.3 (*)    All other components within normal limits  MAGNESIUM    EKG EKG Interpretation  Date/Time:  Monday May 29 2019 09:46:48 EDT Ventricular Rate:  142 PR Interval:    QRS Duration: 76 QT Interval:  320 QTC Calculation: 492 R Axis:   62 Text Interpretation: Atrial flutter  with rapid V-rate Anteroseptal infarct, age  indeterminate Reconfirmed by Davonna Belling [id number]) on 05/29/2019 12:28:19 PM   Radiology DG Elbow Complete Left  Result Date: 05/29/2019 CLINICAL DATA:  Fall, laceration to left elbow EXAM: LEFT ELBOW - COMPLETE 3+ VIEW COMPARISON:  None. FINDINGS: There is no evidence of fracture, dislocation, or joint effusion. There is no evidence of arthropathy or other focal bone abnormality. Soft tissues are unremarkable. IMPRESSION: Negative. Electronically Signed   By: Rolm Baptise M.D.   On: 05/29/2019 10:54   CT Head Wo Contrast  Result Date: 05/29/2019 CLINICAL DATA:  Head trauma.  Mechanical fall. EXAM: CT HEAD WITHOUT CONTRAST TECHNIQUE: Contiguous axial images were obtained from the base of the skull through the vertex without intravenous contrast. COMPARISON:  04/07/2019 MRI head. FINDINGS: Brain: No acute infarct or intracranial hemorrhage. Bilateral basal ganglia calcifications. Re-demonstration of small meningiomas overlying the right frontal and parietal convexities. No midline shift or extra-axial fluid collection. Vascular: No hyperdense vessel. Bilateral carotid siphon and V4 segment atherosclerotic calcifications. Skull: Negative for fracture or focal lesion. Sinuses/Orbits: Bilateral lens replacement. No mastoid effusion. Small left maxillary sinus mucous retention cyst. Other: None. IMPRESSION: No acute intracranial process. Mild cerebral atrophy. Unchanged small right cerebral convexity meningiomas. Electronically Signed   By: Primitivo Gauze M.D.   On: 05/29/2019 10:57    Procedures Procedures (including critical care time)  Medications Ordered in ED Medications  diltiazem (CARDIZEM) 1 mg/mL load via infusion 10 mg (10 mg Intravenous Bolus from Bag 05/29/19 1247)    And  diltiazem (CARDIZEM) 125 mg in dextrose 5% 125 mL (1 mg/mL) infusion (5 mg/hr Intravenous New Bag/Given 05/29/19 1245)    ED Course  I have reviewed the triage vital signs and the nursing notes.  Pertinent  labs & imaging results that were available during my care of the patient were reviewed by me and considered in my medical decision making (see chart for details).    MDM Rules/Calculators/A&P                      Patient presented after fall.  States he was unsteady.  Has been generally weak for a few days now.  Decreased appetite.  Fell and hit head and elbows.  Skin tear in both elbows but no underlying bony injury.  Head CT reassuring.  However does have tachycardia.  Found to be in atrial flutter with RVR.  Had admission to hospital couple months ago with a tachycardia that was thought to be ectopic atrial this appears to be  flutter however.  States he did not take his medicines this morning.  He is not anticoagulated.  Unsure of when it began.  Has been feeling weak for a few days so cannot say it did not start then.  Not a candidate for ER cardioversion.  CRITICAL CARE Performed by: Davonna Belling Total critical care time: 30 minutes Critical care time was exclusive of separately billable procedures and treating other patients. Critical care was necessary to treat or prevent imminent or life-threatening deterioration. Critical care was time spent personally by me on the following activities: development of treatment plan with patient and/or surrogate as well as nursing, discussions with consultants, evaluation of patient's response to treatment, examination of patient, obtaining history from patient or surrogate, ordering and performing treatments and interventions, ordering and review of laboratory studies, ordering and review of radiographic studies, pulse oximetry and re-evaluation of patient's condition.   Chadsvasc of 3 for age and HTN, but patient came in with a fall and states he is weak at baseline.  Will need a risk-benefit for anticoagulation.  Started on Cardizem drip.  Will discuss with hospitalist for admission.  Final Clinical Impression(s) / ED Diagnoses Final diagnoses:    Atrial flutter with rapid ventricular response [facility])    Rx / DC Orders ED Discharge Orders    None       Davonna Belling, MD 05/29/19 1327

## 2019-05-29 NOTE — ED Triage Notes (Signed)
Per [facility] pt from home, lives with family, fell in bathroom due to weakness, struck both elbows, head and left shin, no injuries noted to head and not on blood thinners. EMS reports smells like possible UTI Pt denies taking Cardizem and Metoprolol today but states he takes every am as prescribed    18G LFA NS 1000 BP 120/64 HR 160  RR 18 100% RA  CBG 128

## 2019-05-30 DIAGNOSIS — D539 Nutritional anemia, unspecified: Secondary | ICD-10-CM

## 2019-05-30 DIAGNOSIS — I4891 Unspecified atrial fibrillation: Secondary | ICD-10-CM | POA: Diagnosis not present

## 2019-05-30 DIAGNOSIS — M316 Other giant cell arteritis: Secondary | ICD-10-CM

## 2019-05-30 DIAGNOSIS — I484 Atypical atrial flutter: Secondary | ICD-10-CM | POA: Diagnosis not present

## 2019-05-30 LAB — CBC
HCT: 28.2 % — ABNORMAL LOW (ref 39.0–52.0)
Hemoglobin: 9.1 g/dL — ABNORMAL LOW (ref 13.0–17.0)
MCH: 32.9 pg (ref 26.0–34.0)
MCHC: 32.3 g/dL (ref 30.0–36.0)
MCV: 101.8 fL — ABNORMAL HIGH (ref 80.0–100.0)
Platelets: 65 10*3/uL — ABNORMAL LOW (ref 150–400)
RBC: 2.77 MIL/uL — ABNORMAL LOW (ref 4.22–5.81)
RDW: 20.4 % — ABNORMAL HIGH (ref 11.5–15.5)
WBC: 9 10*3/uL (ref 4.0–10.5)
nRBC: 0.2 % (ref 0.0–0.2)

## 2019-05-30 LAB — BASIC METABOLIC PANEL
Anion gap: 11 (ref 5–15)
BUN: 34 mg/dL — ABNORMAL HIGH (ref 8–23)
CO2: 27 mmol/L (ref 22–32)
Calcium: 8.6 mg/dL — ABNORMAL LOW (ref 8.9–10.3)
Chloride: 102 mmol/L (ref 98–111)
Creatinine, Ser: 0.84 mg/dL (ref 0.61–1.24)
GFR calc Af Amer: >60 mL/min (ref >60)
GFR calc non Af Amer: >60 mL/min (ref >60)
Glucose, Bld: 166 mg/dL — ABNORMAL HIGH (ref 70–99)
Potassium: 4.2 mmol/L (ref 3.5–5.1)
Sodium: 140 mmol/L (ref 135–145)

## 2019-05-30 LAB — MAGNESIUM: Magnesium: 1.9 mg/dL (ref 1.7–2.4)

## 2019-05-30 LAB — MRSA PCR SCREENING: MRSA by PCR: NEGATIVE

## 2019-05-30 MED ORDER — AMIODARONE HCL IN DEXTROSE 360-4.14 MG/200ML-% IV SOLN
30.0000 mg/h | INTRAVENOUS | Status: DC
  Administered 2019-05-30 – 2019-06-02 (×7): 30 mg/h via INTRAVENOUS
  Filled 2019-05-30 (×6): qty 200

## 2019-05-30 MED ORDER — AMIODARONE HCL IN DEXTROSE 360-4.14 MG/200ML-% IV SOLN
60.0000 mg/h | INTRAVENOUS | Status: DC
  Administered 2019-05-30 (×2): 60 mg/h via INTRAVENOUS
  Filled 2019-05-30 (×2): qty 200

## 2019-05-30 MED ORDER — AMIODARONE LOAD VIA INFUSION
150.0000 mg | Freq: Once | INTRAVENOUS | Status: AC
  Administered 2019-05-30: 150 mg via INTRAVENOUS
  Filled 2019-05-30: qty 83.34

## 2019-05-30 MED ORDER — APIXABAN 2.5 MG PO TABS
2.5000 mg | ORAL_TABLET | Freq: Two times a day (BID) | ORAL | Status: DC
  Administered 2019-05-30 – 2019-06-07 (×17): 2.5 mg via ORAL
  Filled 2019-05-30 (×17): qty 1

## 2019-05-30 NOTE — Plan of Care (Signed)
  Problem: Education: Goal: Knowledge of General Education information will improve Description: Including pain rating scale, medication(s)/side effects and non-pharmacologic comfort measures Outcome: Progressing   Problem: Health Behavior/Discharge Planning: Goal: Ability to manage health-related needs will improve Outcome: Progressing   Problem: Clinical Measurements: Goal: Will remain free from infection Outcome: Progressing   Problem: Activity: Goal: Risk for activity intolerance will decrease Outcome: Progressing   Problem: Nutrition: Goal: Adequate nutrition will be maintained Outcome: Progressing   Problem: Elimination: Goal: Will not experience complications related to bowel motility Outcome: Progressing   Problem: Safety: Goal: Ability to remain free from injury will improve Outcome: Progressing   

## 2019-05-30 NOTE — Evaluation (Signed)
Physical Therapy Evaluation Patient Details Name: Matthew Hickman MRN: [id number] DOB: 1925-06-20 Today's Date: 05/30/2019   History of Present Illness  86 yo admitted after fall at home with weakness. PMHx: HTN, skin CA, atrial ectopy, bil clubfoot surgery, giant cell arteritis, HOH, visual impairment  Clinical Impression  Pt pleasant and willing to mobilize but reports weakness and great difficulty moving. Pt with decreased strength, transfers, balance, gait and functional mobility who will benefit from acute therapy to maximize function and balance to decrease caregiver burden.   HR 99 at rest with rise to 157 with pivot to chair and declined to 122 after 20 sec seated BP 169/129 after transfer    Follow Up Recommendations SNF;Supervision/Assistance - 24 hour    Equipment Recommendations  None recommended by PT    Recommendations for Other Services OT consult     Precautions / Restrictions Precautions Precautions: Fall Precaution Comments: watch HR      Mobility  Bed Mobility Overal bed mobility: Needs Assistance Bed Mobility: Rolling;Sidelying to Sit Rolling: Min assist Sidelying to sit: Mod assist       General bed mobility comments: cues for sequence with assist to roll to side and elevate trunk from surface with increased time and effort  Transfers Overall transfer level: Needs assistance   Transfers: Sit to/from Stand;Stand Pivot Transfers Sit to Stand: Min assist Stand pivot transfers: Mod assist       General transfer comment: cues for hand placement with knee blocked and assist to rise from surface and physical assist to pivot to chair toward right. pt unable to take significant steps and HR up to 157 with pivot  Ambulation/Gait             General Gait Details: unable  Stairs            Wheelchair Mobility    Modified Rankin (Stroke Patients Only)       Balance Overall balance assessment: Needs assistance;History of  Falls Sitting-balance support: Feet supported Sitting balance-Leahy Scale: Fair Sitting balance - Comments: guarding for stability EOB     Standing balance-Leahy Scale: Poor Standing balance comment: pt requires physical assist to maintain standing                             Pertinent Vitals/Pain Pain Assessment: No/denies pain    Home Living Family/patient expects to be discharged to:: Private residence Living Arrangements: Spouse/significant other;Children Available Help at Discharge: Family;Available PRN/intermittently Type of Home: House Home Access: Level entry     Home Layout: Multi-level Home Equipment: Grab bars - tub/shower;Grab bars - toilet;Walker - 2 wheels;Cane - single point      Prior Function Level of Independence: Independent with assistive device(s)         Comments: pt reports he was driving and not using anything to walk with until 2 months ago. since that time progressive visual loss and was using RW at home and assisting wife who is an amputee. son lives in the basement level but has disabilities himself     Hand Dominance        Extremity/Trunk Assessment   Upper Extremity Assessment Upper Extremity Assessment: Generalized weakness    Lower Extremity Assessment Lower Extremity Assessment: Generalized weakness    Cervical / Trunk Assessment Cervical / Trunk Assessment: Kyphotic  Communication   Communication: HOH  Cognition Arousal/Alertness: Awake/alert Behavior During Therapy: WFL for tasks assessed/performed Overall Cognitive Status: Impaired/Different from baseline Area  of Impairment: Orientation;Memory;Following commands;Safety/judgement                 Orientation Level: Disoriented to;Time   Memory: Decreased short-term memory Following Commands: Follows one step commands consistently;Follows one step commands with increased time Safety/Judgement: Decreased awareness of deficits            General  Comments      Exercises General Exercises - Lower Extremity Long Arc Quad: AROM;Both;Seated;10 reps Hip Flexion/Marching: AROM;Both;Seated;10 reps   Assessment/Plan    PT Assessment Patient needs continued PT services  PT Problem List Decreased strength;Decreased mobility;Decreased activity tolerance;Decreased balance;Decreased knowledge of use of DME;Cardiopulmonary status limiting activity       PT Treatment Interventions DME instruction;Therapeutic exercise;Gait training;Balance training;Functional mobility training;Therapeutic activities;Patient/family education    PT Goals (Current goals can be found in the Care Plan section)  Acute Rehab PT Goals Patient Stated Goal: be able to walk and get stronger PT Goal Formulation: With patient Time For Goal Achievement: 06/13/19 Potential to Achieve Goals: Fair    Frequency Min 3X/week   Barriers to discharge Decreased caregiver support      Co-evaluation               AM-PAC PT "6 Clicks" Mobility  Outcome Measure Help needed turning from your back to your side while in a flat bed without using bedrails?: A Little Help needed moving from lying on your back to sitting on the side of a flat bed without using bedrails?: A Lot Help needed moving to and from a bed to a chair (including a wheelchair)?: A Lot Help needed standing up from a chair using your arms (e.g., wheelchair or bedside chair)?: A Little Help needed to walk in hospital room?: Total Help needed climbing 3-5 steps with a railing? : Total 6 Click Score: 12    End of Session Equipment Utilized During Treatment: Gait belt Activity Tolerance: Patient tolerated treatment well Patient left: in chair;with call bell/phone within reach;with chair alarm set Nurse Communication: Mobility status;Precautions PT Visit Diagnosis: Difficulty in walking, not elsewhere classified (R26.2);Other abnormalities of gait and mobility (R26.89);Muscle weakness (generalized)  (M62.81);History of falling (Z91.81)    Time: [id number] PT Time Calculation (min) (ACUTE ONLY): 24 min   Charges:   PT Evaluation $PT Eval Moderate Complexity: 1 Mod PT Treatments $Therapeutic Activity: 8-22 mins        Dayshaun Whobrey P, PT Acute Rehabilitation Services Pager: [phone] Office: [phone]   Theodora Lalanne B Jullien Granquist 05/30/2019, 1:12 PM

## 2019-05-30 NOTE — Plan of Care (Signed)

## 2019-05-30 NOTE — Progress Notes (Signed)
Progress Note  Patient Name: Matthew Hickman Date of Encounter: 05/30/2019  Primary Cardiologist: Sherren Mocha, MD   Subjective   Denies dyspnea or dizziness. BP is low (90s/40s) and diltiazem stopped. Ventricular rate 100 during sleep, 120-140 sitting in chair.  Inpatient Medications    Scheduled Meds: . amiodarone  150 mg Intravenous Once  . apixaban  5 mg Oral BID  . feeding supplement (ENSURE ENLIVE)  237 mL Oral TID BM  . metoprolol tartrate  25 mg Oral Once  . metoprolol tartrate  50 mg Oral BID  . multivitamin with minerals  1 tablet Oral Daily  . pantoprazole  20 mg Oral Daily  . predniSONE  30 mg Oral BID WC  . sodium chloride flush  3 mL Intravenous Q12H  . terazosin  1 mg Oral QHS   Continuous Infusions: . sodium chloride    . amiodarone     Followed by  . amiodarone    . cefTRIAXone (ROCEPHIN)  IV    . diltiazem (CARDIZEM) infusion Stopped (05/30/19 0418)   PRN Meds: sodium chloride, acetaminophen, ondansetron (ZOFRAN) IV, sodium chloride flush   Vital Signs    Vitals:   05/30/19 0100 05/30/19 0200 05/30/19 0439 05/30/19 0502  BP: (!) 91/52 (!) 92/49 (!) 93/54 (!) 100/49  Pulse: 89 76 92 (!) 105  Resp: (!) 23 20 (!) 26 20  Temp:   98.7 F (37.1 C) 98 F (36.7 C)  TempSrc:   Oral Oral  SpO2:   96% 95%  Weight:    56.4 kg    Intake/Output Summary (Last 24 hours) at 05/30/2019 0844 Last data filed at 05/29/2019 1444 Gross per 24 hour  Intake --  Output 200 ml  Net -200 ml   Last 3 Weights 05/30/2019 04/10/2019 04/09/2019  Weight (lbs) 124 lb 5.4 oz 128 lb 3.2 oz 126 lb 12.8 oz  Weight (kg) 56.4 kg 58.151 kg 57.516 kg      Telemetry    Atypical atrial flutter - Personally Reviewed  ECG    No new tracing - Personally Reviewed  Physical Exam  Elderly and frail GEN: No acute distress.   Neck: No JVD Cardiac: RRR, no murmurs, rubs, or gallops.  Respiratory: Clear to auscultation bilaterally. GI: Soft, nontender, non-distended  MS: No  edema; No deformity. Neuro:  Nonfocal  Psych: Normal affect   Labs    High Sensitivity Troponin:  No results for input(s): TROPONINIHS in the last 720 hours.    Chemistry Recent Labs  Lab 05/29/19 0959 05/30/19 0341  NA 142 140  K 3.9 4.2  CL 105 102  CO2 25 27  GLUCOSE 96 166*  BUN 33* 34*  CREATININE 0.95 0.84  CALCIUM 8.5* 8.6*  GFRNONAA >60 >60  GFRAA >60 >60  ANIONGAP 12 11     Hematology Recent Labs  Lab 05/29/19 0959 05/30/19 0341  WBC 10.2 9.0  RBC 3.00* 2.77*  HGB 10.2* 9.1*  HCT 31.1* 28.2*  MCV 103.7* 101.8*  MCH 34.0 32.9  MCHC 32.8 32.3  RDW 20.1* 20.4*  PLT 68* 65*    BNPNo results for input(s): BNP, PROBNP in the last 168 hours.   DDimer No results for input(s): DDIMER in the last 168 hours.   Radiology    DG Elbow Complete Left  Result Date: 05/29/2019 CLINICAL DATA:  Fall, laceration to left elbow EXAM: LEFT ELBOW - COMPLETE 3+ VIEW COMPARISON:  None. FINDINGS: There is no evidence of fracture, dislocation, or joint effusion.  There is no evidence of arthropathy or other focal bone abnormality. Soft tissues are unremarkable. IMPRESSION: Negative. Electronically Signed   By: Rolm Baptise M.D.   On: 05/29/2019 10:54   CT Head Wo Contrast  Result Date: 05/29/2019 CLINICAL DATA:  Head trauma.  Mechanical fall. EXAM: CT HEAD WITHOUT CONTRAST TECHNIQUE: Contiguous axial images were obtained from the base of the skull through the vertex without intravenous contrast. COMPARISON:  04/07/2019 MRI head. FINDINGS: Brain: No acute infarct or intracranial hemorrhage. Bilateral basal ganglia calcifications. Re-demonstration of small meningiomas overlying the right frontal and parietal convexities. No midline shift or extra-axial fluid collection. Vascular: No hyperdense vessel. Bilateral carotid siphon and V4 segment atherosclerotic calcifications. Skull: Negative for fracture or focal lesion. Sinuses/Orbits: Bilateral lens replacement. No mastoid effusion.  Small left maxillary sinus mucous retention cyst. Other: None. IMPRESSION: No acute intracranial process. Mild cerebral atrophy. Unchanged small right cerebral convexity meningiomas. Electronically Signed   By: Primitivo Gauze M.D.   On: 05/29/2019 10:57   DG CHEST PORT 1 VIEW  Result Date: 05/29/2019 CLINICAL DATA:  Pain following fall EXAM: PORTABLE CHEST 1 VIEW COMPARISON:  April 04, 2019 FINDINGS: There is atelectatic change in the left base. Lungs elsewhere are clear. Heart size and pulmonary vascularity are normal. No adenopathy. There is aortic atherosclerosis. No pneumothorax. No appreciable bone lesions. IMPRESSION: Left base atelectasis. Lungs elsewhere clear. Stable cardiac silhouette. No pneumothorax. Aortic Atherosclerosis (ICD10-I70.0). Electronically Signed   By: Lowella Grip III M.D.   On: 05/29/2019 13:27    Cardiac Studies   Echo 04/05/19 IMPRESSIONS    1. Left ventricular ejection fraction, by estimation, is 55 to 60%. The  left ventricle has normal function. The left ventricle has no regional  wall motion abnormalities. Left ventricular diastolic parameters are  consistent with Grade I diastolic  dysfunction (impaired relaxation).  2. Right ventricular systolic function is normal. The right ventricular  size is normal. There is moderately elevated pulmonary artery systolic  pressure. The estimated right ventricular systolic pressure is 86.3 mmHg.  3. The mitral valve is normal in structure and function. Trivial mitral  valve regurgitation. No evidence of mitral stenosis.  4. The aortic valve is tricuspid. Aortic valve regurgitation is not  visualized. No aortic stenosis is present.  5. Aortic dilatation noted. There is mild dilatation of the ascending  aorta measuring 40 mm.  6. The inferior vena cava is dilated in size with <50% respiratory  variability, suggesting right atrial pressure of 15 mmHg.    Patient Profile     86 y.o. male with HTN, giant  cell arteritis with visual involvement, presenting with atypical (left atrial) flutter with RVR  Assessment & Plan    Hypotensive with diltiazem, poor rate control, will start IV amiodarone. No signs of CHF at this time. On apixaban, so far no bleeding issues. Thrombocytopenia confirmed, also has macrocytic anemia with recent normal B12 level. May have myelodysplasia, consider Hematology consultation. GCA appears to be currently quiescent with low ESR last month.  May need TEE guided DC cardioversion if we cannot provide good rate control with meds.     For questions or updates, please contact [place] [facility] Please consult [email or web address] for contact info under        Signed, Sanda Klein, MD  05/30/2019, 8:44 AM

## 2019-05-30 NOTE — Progress Notes (Addendum)
PROGRESS NOTE  Matthew Hickman [id number] DOB: [birth year] DOA: 05/29/2019 PCP: [name], MD  HPI/Recap of past 24 hours: Matthew Hickman is a 86 y.o. male with medical history significant of hypertension, questionable paroxysmal A. fib, newly diagnosed giant cell arteritis with bilateral vision impairment on steroid, hard hearing, presented with fall.  Baseline patient has been healthy active to about 2 months ago developed this deterioration of vision.  Over the weekend, patient started to feel weak generalized, and today he fell while in the bathroom, and bumped his head to the wall, but he denied any lightheadedness or palpitations or chest pains were short of breath before this happened, and he denied any loss of consciousness or worsening of his vision.  Last month, patient was diagnosed with giant cell arteritis, and has been on high-dose of steroid through the month, but patient feel very little improvement with vision, compared to last month. He can only see light and silhouette of big object such as TV set and ceiling light.  Last month on same admission he had episode of A. Fib?, but eventually the diagnosis of A. fib not established, and patient sent home with p.o. Cardizem and metoprolol.  05/30/19: Seen and examined.  Very hard of hearing.  Denies any chest pain or palpitations.  Seen by cardiology.  Appreciate recommendations.    Assessment/Plan: Active Problems:   A-fib (HCC)  A. flutter with RVR Personally reviewed twelve-lead EKG done on admission which shows a flutter rate of 142 Seen by cardiology Hypotensive on Cardizem drip which was stopped, IV amiodarone started Also oral beta-blocker Lopressor 50 mg twice daily Eliquis for CVA prevention Continue to closely monitor vital signs Denies any anginal symptoms Per cardiology may need TEE guided cardioversion if fails conservative management Last 2D echo on 04/05/19> normal LVEF 55 to 60% with grade 1 diastolic  dysfunction.  Hypotensive, likely iatrogenic in the setting of diltiazem drip along with other meds. Diltiazem held and IV amiodarone started Maintain MAP greater than 65  Presumed UTI Presented with pyuria Urine culture ordered and pending Continue empiric antibiotics, Rocephin day #1  Acute thrombocytopenia in the setting of acute illness Plt 65K from 68K Continue to closely monitor No overt bleeding  Giant cell arteritis Continue prednisone home regimen Continue PPI for GI prophylaxis  Moderate pulmonary hypertension Euvolemic on exam Management per cardiology's recommendation  Macrocytic anemia Baseline hemoglobin 11 Hemoglobin 9.1 MCV of 101 No overt bleeding  BPH Continue Terazosin.   Monitor urine output  DVT prophylaxis:  Eliquis Code Status: Full code Family Communication:  Will call family if okay with the patient.  Disposition Plan:  Patient is from home.  Anticipate discharge to home in the next 48 to 72 hours or when cardiology signs off.  Barrier to discharge: Ongoing management for A. fib/A-flutter with RVR.  Consults called: cardiology       Objective: Vitals:   05/30/19 0502 05/30/19 0800 05/30/19 0801 05/30/19 0929  BP: (!) 100/49 (!) 98/46  (!) 104/48  Pulse: (!) 105 (!) 113 (!) 110 (!) 110  Resp: 20 20  20   Temp: 98 F (36.7 C) 97.9 F (36.6 C)    TempSrc: Oral Oral    SpO2: 95% 95%    Weight: 56.4 kg       Intake/Output Summary (Last 24 hours) at 05/30/2019 1341 Last data filed at 05/29/2019 1444 Gross per 24 hour  Intake --  Output 100 ml  Net -100 ml   [employer]  05/30/19 0502  Weight: 56.4 kg    Exam:  . General: 86 y.o. year-old male well developed well nourished in no acute distress.  Very hard of hearing.  Alert and interactive. . Cardiovascular: Irregular rate and rhythm with no rubs or gallops.   Marland Kitchen Respiratory: Clear to auscultation with no wheezes or rales.  . Abdomen: Soft nontender nondistended with  normal bowel sounds. . Musculoskeletal: Trace lower extremity edema bilaterally . Psychiatry: Mood is appropriate for condition and setting   Data Reviewed: CBC: Recent Labs  Lab 05/29/19 0959 05/30/19 0341  WBC 10.2 9.0  HGB 10.2* 9.1*  HCT 31.1* 28.2*  MCV 103.7* 101.8*  PLT 68* 65*   Basic Metabolic Panel: Recent Labs  Lab 05/29/19 0959 05/30/19 0341  NA 142 140  K 3.9 4.2  CL 105 102  CO2 25 27  GLUCOSE 96 166*  BUN 33* 34*  CREATININE 0.95 0.84  CALCIUM 8.5* 8.6*  MG  --  1.9   GFR: Estimated Creatinine Clearance: 45.7 mL/min (by C-G formula based on SCr of 0.84 mg/dL). Liver Function Tests: No results for input(s): AST, ALT, ALKPHOS, BILITOT, PROT, ALBUMIN in the last 168 hours. No results for input(s): LIPASE, AMYLASE in the last 168 hours. No results for input(s): AMMONIA in the last 168 hours. Coagulation Profile: No results for input(s): INR, PROTIME in the last 168 hours. Cardiac Enzymes: No results for input(s): CKTOTAL, CKMB, CKMBINDEX, TROPONINI in the last 168 hours. BNP (last 3 results) No results for input(s): PROBNP in the last 8760 hours. HbA1C: No results for input(s): HGBA1C in the last 72 hours. CBG: Recent Labs  Lab 05/29/19 1918  GLUCAP 218*   Lipid Profile: No results for input(s): CHOL, HDL, LDLCALC, TRIG, CHOLHDL, LDLDIRECT in the last 72 hours. Thyroid Function Tests: No results for input(s): TSH, T4TOTAL, FREET4, T3FREE, THYROIDAB in the last 72 hours. Anemia Panel: No results for input(s): VITAMINB12, FOLATE, FERRITIN, TIBC, IRON, RETICCTPCT in the last 72 hours. Urine analysis:    Component Value Date/Time   COLORURINE YELLOW 05/29/2019 1211   APPEARANCEUR HAZY (A) 05/29/2019 1211   LABSPEC 1.015 05/29/2019 1211   PHURINE 7.0 05/29/2019 1211   GLUCOSEU NEGATIVE 05/29/2019 1211   HGBUR SMALL (A) 05/29/2019 1211   BILIRUBINUR NEGATIVE 05/29/2019 1211   KETONESUR NEGATIVE 05/29/2019 1211   PROTEINUR 30 (A) 05/29/2019  1211   UROBILINOGEN 1.0 09/20/2010 1226   NITRITE NEGATIVE 05/29/2019 1211   LEUKOCYTESUR LARGE (A) 05/29/2019 1211   Sepsis Labs: @LABRCNTIP (procalcitonin:4,lacticidven:4)  ) Recent Results (from the past 240 hour(s))  Respiratory Panel by RT PCR (Flu A&B, Covid) - Nasopharyngeal Swab     Status: None   Collection Time: 05/29/19  1:58 PM   Specimen: Nasopharyngeal Swab  Result Value Ref Range Status   SARS Coronavirus 2 by RT PCR NEGATIVE NEGATIVE Final    Comment: (NOTE) SARS-CoV-2 target nucleic acids are NOT DETECTED. The SARS-CoV-2 RNA is generally detectable in upper respiratoy specimens during the acute phase of infection. The lowest concentration of SARS-CoV-2 viral copies this assay can detect is 131 copies/mL. A negative result does not preclude SARS-Cov-2 infection and should not be used as the sole basis for treatment or other patient management decisions. A negative result may occur with  improper specimen collection/handling, submission of specimen other than nasopharyngeal swab, presence of viral mutation(s) within the areas targeted by this assay, and inadequate number of viral copies (<131 copies/mL). A negative result must be combined with clinical observations, patient history,  and epidemiological information. The expected result is Negative. Fact Sheet for Patients:  PinkCheek.be Fact Sheet for Healthcare Providers:  GravelBags.it This test is not yet ap proved or cleared by the [place] FDA and  has been authorized for detection and/or diagnosis of SARS-CoV-2 by FDA under an Emergency Use Authorization (EUA). This EUA will remain  in effect (meaning this test can be used) for the duration of the COVID-19 declaration under Section 564(b)(1) of the Act, 21 U.S.C. section 360bbb-3(b)(1), unless the authorization is terminated or revoked sooner.    Influenza A by PCR NEGATIVE NEGATIVE Final    Influenza B by PCR NEGATIVE NEGATIVE Final    Comment: (NOTE) The Xpert Xpress SARS-CoV-2/FLU/RSV assay is intended as an aid in  the diagnosis of influenza from Nasopharyngeal swab specimens and  should not be used as a sole basis for treatment. Nasal washings and  aspirates are unacceptable for Xpert Xpress SARS-CoV-2/FLU/RSV  testing. Fact Sheet for Patients: PinkCheek.be Fact Sheet for Healthcare Providers: GravelBags.it This test is not yet approved or cleared by the [place] FDA and  has been authorized for detection and/or diagnosis of SARS-CoV-2 by  FDA under an Emergency Use Authorization (EUA). This EUA will remain  in effect (meaning this test can be used) for the duration of the  Covid-19 declaration under Section 564(b)(1) of the Act, 21  U.S.C. section 360bbb-3(b)(1), unless the authorization is  terminated or revoked. Performed at [facility], [place] [address], [place], [place] [address]   MRSA PCR Screening     Status: None   Collection Time: 05/30/19  5:43 AM   Specimen: Nasal Mucosa; Nasopharyngeal  Result Value Ref Range Status   MRSA by PCR NEGATIVE NEGATIVE Final    Comment:        The GeneXpert MRSA Assay (FDA approved for NASAL specimens only), is one component of a comprehensive MRSA colonization surveillance program. It is not intended to diagnose MRSA infection nor to guide or monitor treatment for MRSA infections. Performed at [facility], [place] [address], [place], [place] [address]       Studies: No results found.  Scheduled Meds: . apixaban  2.5 mg Oral BID  . feeding supplement (ENSURE ENLIVE)  237 mL Oral TID BM  . metoprolol tartrate  25 mg Oral Once  . metoprolol tartrate  50 mg Oral BID  . multivitamin with minerals  1 tablet Oral Daily  . pantoprazole  20 mg Oral Daily  . predniSONE  30 mg Oral BID WC  . sodium chloride flush  3 mL Intravenous Q12H  .  terazosin  1 mg Oral QHS    Continuous Infusions: . sodium chloride    . amiodarone 60 mg/hr (05/30/19 0854)   Followed by  . amiodarone    . cefTRIAXone (ROCEPHIN)  IV 1 g (05/30/19 1255)  . diltiazem (CARDIZEM) infusion Stopped (05/30/19 0418)     LOS: 1 day     Kayleen Memos, MD [facility] Pager [phone]  If 7PM-7AM, please contact night-coverage [email or web address] Password [facility] 05/30/2019, 1:41 PM

## 2019-05-31 DIAGNOSIS — D539 Nutritional anemia, unspecified: Secondary | ICD-10-CM | POA: Diagnosis not present

## 2019-05-31 DIAGNOSIS — I4891 Unspecified atrial fibrillation: Secondary | ICD-10-CM | POA: Diagnosis not present

## 2019-05-31 DIAGNOSIS — M316 Other giant cell arteritis: Secondary | ICD-10-CM | POA: Diagnosis not present

## 2019-05-31 DIAGNOSIS — I484 Atypical atrial flutter: Secondary | ICD-10-CM | POA: Diagnosis not present

## 2019-05-31 LAB — CBC
HCT: 32.1 % — ABNORMAL LOW (ref 39.0–52.0)
Hemoglobin: 10.4 g/dL — ABNORMAL LOW (ref 13.0–17.0)
MCH: 32.9 pg (ref 26.0–34.0)
MCHC: 32.4 g/dL (ref 30.0–36.0)
MCV: 101.6 fL — ABNORMAL HIGH (ref 80.0–100.0)
Platelets: 83 10*3/uL — ABNORMAL LOW (ref 150–400)
RBC: 3.16 MIL/uL — ABNORMAL LOW (ref 4.22–5.81)
RDW: 20.3 % — ABNORMAL HIGH (ref 11.5–15.5)
WBC: 11.9 10*3/uL — ABNORMAL HIGH (ref 4.0–10.5)
nRBC: 0.2 % (ref 0.0–0.2)

## 2019-05-31 LAB — BASIC METABOLIC PANEL
Anion gap: 10 (ref 5–15)
BUN: 39 mg/dL — ABNORMAL HIGH (ref 8–23)
CO2: 28 mmol/L (ref 22–32)
Calcium: 8.6 mg/dL — ABNORMAL LOW (ref 8.9–10.3)
Chloride: 100 mmol/L (ref 98–111)
Creatinine, Ser: 0.84 mg/dL (ref 0.61–1.24)
GFR calc Af Amer: >60 mL/min (ref >60)
GFR calc non Af Amer: >60 mL/min (ref >60)
Glucose, Bld: 174 mg/dL — ABNORMAL HIGH (ref 70–99)
Potassium: 4.3 mmol/L (ref 3.5–5.1)
Sodium: 138 mmol/L (ref 135–145)

## 2019-05-31 LAB — SAVE SMEAR(SSMR), FOR PROVIDER SLIDE REVIEW

## 2019-05-31 MED ORDER — DIGOXIN 125 MCG PO TABS
0.1250 mg | ORAL_TABLET | Freq: Every day | ORAL | Status: DC
  Administered 2019-06-01 – 2019-06-02 (×2): 0.125 mg via ORAL
  Filled 2019-05-31 (×3): qty 1

## 2019-05-31 MED ORDER — DILTIAZEM HCL-DEXTROSE 125-5 MG/125ML-% IV SOLN (PREMIX)
5.0000 mg/h | INTRAVENOUS | Status: DC
  Administered 2019-06-01: 15 mg/h via INTRAVENOUS
  Administered 2019-06-01 (×2): 10 mg/h via INTRAVENOUS
  Filled 2019-05-31 (×4): qty 125

## 2019-05-31 MED ORDER — DIGOXIN 125 MCG PO TABS
0.2500 mg | ORAL_TABLET | ORAL | Status: AC
  Administered 2019-05-31 (×3): 0.25 mg via ORAL
  Filled 2019-05-31 (×3): qty 2

## 2019-05-31 NOTE — Plan of Care (Signed)
?  Problem: Education: ?Goal: Knowledge of General Education information will improve ?Description: Including pain rating scale, medication(s)/side effects and non-pharmacologic comfort measures ?Outcome: Progressing ?  ?Problem: Clinical Measurements: ?Goal: Cardiovascular complication will be avoided ?Outcome: Progressing ?  ?Problem: Nutrition: ?Goal: Adequate nutrition will be maintained ?Outcome: Progressing ?  ?Problem: Pain Managment: ?Goal: General experience of comfort will improve ?Outcome: Progressing ?  ?Problem: Safety: ?Goal: Ability to remain free from injury will improve ?Outcome: Progressing ?  ?Problem: Skin Integrity: ?Goal: Risk for impaired skin integrity will decrease ?Outcome: Progressing ?  ?

## 2019-05-31 NOTE — Progress Notes (Signed)
Ok to d'c cardizem continuous IV infusion per Dr. Irene Pap

## 2019-05-31 NOTE — Progress Notes (Signed)
Initial Nutrition Assessment  RD working remotely.  DOCUMENTATION CODES:   Not applicable, suspect malnutrition but unable to confirm without NFPE  INTERVENTION:   - Continue Ensure Enlive po TID, each supplement provides 350 kcal and 20 grams of protein  - Magic Cup TID with meals, each supplement provides 290 kcal and 9 grams of protein  - Continue MVI with minerals daily  - Downgrade diet to Dysphagia 3 (advanced mechanical soft) for ease of intake  NUTRITION DIAGNOSIS:   Increased nutrient needs related to acute illness as evidenced by estimated needs.  GOAL:   Patient will meet greater than or equal to 90% of their needs  MONITOR:   PO intake, Supplement acceptance, Weight trends, I & O's  REASON FOR ASSESSMENT:   Malnutrition Screening Tool    ASSESSMENT:   86 year old male who presented on 4/26 after a fall. PMH of HTN, newly diagnosed giant cell arteritis with bilateral vision impairment, skin cancer.   Pt is on a Heart Healthy diet. No PO intake documented since admission.  Pt accepting 100% of Ensure Enlive supplements per [id number] documentation. Will continue with current regimen. Will also add Magic Cups with meals.  Unable to reach pt via phone call to room. Reviewed RD note from previous admission (04/05/19). Pt diagnosed with severe malnutrition in the context of chronic illness at that time. Noted pt reported a decreased appetite over the last 1-2 years and reported difficulty chewing solid foods. RD will downgrade diet to Dysphagia 3.  During previous admission, pt reported UBW as 145 lbs and that he last weighed this 2 years ago. Pt endorsed a gradual weight loss over time.  Reviewed weight history in chart. Pt with a 1.3 kg weight loss since 01/24/19. This is a 2.2% weight loss which is not significant for timeframe. However, pt with deep pitting edema to BLE which may be masking weight loss.  Suspect severe malnutrition persists but unable to confirm  without NFPE.  Medications reviewed and include: Ensure Enlive TID, MVI with minerals, protonix, prednisone, amiodarone, IV abx  Labs reviewed: hemoglobin 9.1  UOP: 700 ml x 24 hours  NUTRITION - FOCUSED PHYSICAL EXAM:  Unable to complete at this time. RD working remotely.  Diet Order:   Diet Order            DIET DYS 3 Room service appropriate? Yes; Fluid consistency: Thin  Diet effective now              EDUCATION NEEDS:   No education needs have been identified at this time  Skin:  Skin Assessment: Reviewed RN Assessment  Last BM:  05/28/19  Height:   Ht Readings from Last 1 Encounters:  05/30/19 5\' 8"  (1.727 m)    Weight:   Wt Readings from Last 1 Encounters:  05/31/19 58.6 kg    Ideal Body Weight:  70 kg  BMI:  Body mass index is 19.64 kg/m.  Estimated Nutritional Needs:   Kcal:  1600-1800  Protein:  75-90 grams  Fluid:  1.6-1.8 L    Gaynell Face, MS, RD, LDN Inpatient Clinical Dietitian Pager: [phone] Weekend/After Hours: [phone]

## 2019-05-31 NOTE — Progress Notes (Signed)
PROGRESS NOTE  Matthew Hickman [id number] DOB: [birth year] DOA: 05/29/2019 PCP: [name], MD  HPI/Recap of past 24 hours: Matthew Hickman is a 86 y.o. male with medical history significant of hypertension, paroxysmal A. fib, newly diagnosed giant cell arteritis with bilateral vision impairment on steroid, hard hearing, presented with fall.  Last month, he was diagnosed with giant cell arteritis, and has been on high-dose of steroid through the month, poor eyesight.  Last month on same admission he had episode of A. Fib?, but eventually the diagnosis of A. fib was not established, and sent home with p.o. Cardizem and metoprolol.  ED Course: CT head shows chronic changes, and patient was found in rapid A. fib.  Cardiology consulted and TR[facility] asked to admit.  05/31/19: Seen and examined.  He has no new complaints.  He denies chest pain, palpitations or dyspnea.  Cardiology following.    Assessment/Plan: Active Problems:   A-fib (HCC)  A. flutter with RVR Personally reviewed twelve-lead EKG done on admission which shows a flutter rate of 142 Currently on Cardizem drip, amiodarone drip and digoxin p.o. 0.25 mg every 2 hours x 3 doses Also oral beta-blocker Lopressor 50 mg twice daily Eliquis for CVA prevention Continue to closely monitor vital signs Denies any anginal symptoms Per cardiology may need TEE guided cardioversion if fails conservative management Last 2D echo on 04/05/19> normal LVEF 55 to 60% with grade 1 diastolic dysfunction.  Resolved hypotensive, likely iatrogenic Maintain MAP greater than 65 Continue to monitor vital signs  Presumed UTI Presented with pyuria Urine culture in process Continue empiric antibiotics, Rocephin day #2  Acute thrombocytopenia in the setting of acute illness, improving Platelets 83K Continue to closely monitor No overt bleeding  Giant cell arteritis Continue prednisone home regimen Continue PPI for GI prophylaxis  Moderate pulmonary  hypertension Euvolemic on exam Management per cardiology's recommendation  Macrocytic anemia Baseline hemoglobin 11 Hemoglobin 9.1> 10.4 MCV of 101 No overt bleeding  BPH Continue Terazosin.   Monitor urine output  DVT prophylaxis:  Eliquis Code Status: Full code Family Communication:  Will call family if okay with the patient.  Disposition Plan:  Patient is from home.  Anticipate discharge to home in the next 48 to 72 hours or when cardiology signs off.  Barrier to discharge: Ongoing management for A. fib/A-flutter with RVR with possible TEE directed cardioversion tomorrow.  Consults called: cardiology       Objective: Vitals:   05/31/19 0424 05/31/19 0441 05/31/19 0746 05/31/19 1206  BP: 112/65  108/72 112/61  Pulse: (!) 127   (!) 113  Resp: (!) 23 (!) 21 20 20   Temp: 97.6 F (36.4 C)  98 F (36.7 C) 98.2 F (36.8 C)  TempSrc: Oral  Oral Oral  SpO2: 92%  98% 94%  Weight: 58.6 kg     Height:        Intake/Output Summary (Last 24 hours) at 05/31/2019 1550 Last data filed at 05/31/2019 0426 Gross per 24 hour  Intake 781.56 ml  Output 500 ml  Net 281.56 ml   Filed Weights   05/30/19 0502 05/31/19 0424  Weight: 56.4 kg 58.6 kg    Exam:  . General: 86 y.o. year-old male well-developed well-nourished in no acute distress.  Very hard of hearing.  Alert and interactive.   . Cardiovascular: Irregular rate and rhythm no rubs or gallops. Marland Kitchen Respiratory: Mild rales at bases no wheezing noted.   . Abdomen: Soft nontender normal bowel sounds present. . Musculoskeletal:  Trace lower extremity edema bilaterally.   Marland Kitchen Psychiatry: Mood is appropriate for condition and setting.  Data Reviewed: CBC: Recent Labs  Lab 05/29/19 0959 05/30/19 0341 05/31/19 0810  WBC 10.2 9.0 11.9*  HGB 10.2* 9.1* 10.4*  HCT 31.1* 28.2* 32.1*  MCV 103.7* 101.8* 101.6*  PLT 68* 65* 83*   Basic Metabolic Panel: Recent Labs  Lab 05/29/19 0959 05/30/19 0341 05/31/19 0235  NA 142 140  138  K 3.9 4.2 4.3  CL 105 102 100  CO2 25 27 28   GLUCOSE 96 166* 174*  BUN 33* 34* 39*  CREATININE 0.95 0.84 0.84  CALCIUM 8.5* 8.6* 8.6*  MG  --  1.9  --    GFR: Estimated Creatinine Clearance: 47.5 mL/min (by C-G formula based on SCr of 0.84 mg/dL). Liver Function Tests: No results for input(s): AST, ALT, ALKPHOS, BILITOT, PROT, ALBUMIN in the last 168 hours. No results for input(s): LIPASE, AMYLASE in the last 168 hours. No results for input(s): AMMONIA in the last 168 hours. Coagulation Profile: No results for input(s): INR, PROTIME in the last 168 hours. Cardiac Enzymes: No results for input(s): CKTOTAL, CKMB, CKMBINDEX, TROPONINI in the last 168 hours. BNP (last 3 results) No results for input(s): PROBNP in the last 8760 hours. HbA1C: No results for input(s): HGBA1C in the last 72 hours. CBG: Recent Labs  Lab 05/29/19 1918  GLUCAP 218*   Lipid Profile: No results for input(s): CHOL, HDL, LDLCALC, TRIG, CHOLHDL, LDLDIRECT in the last 72 hours. Thyroid Function Tests: No results for input(s): TSH, T4TOTAL, FREET4, T3FREE, THYROIDAB in the last 72 hours. Anemia Panel: No results for input(s): VITAMINB12, FOLATE, FERRITIN, TIBC, IRON, RETICCTPCT in the last 72 hours. Urine analysis:    Component Value Date/Time   COLORURINE YELLOW 05/29/2019 1211   APPEARANCEUR HAZY (A) 05/29/2019 1211   LABSPEC 1.015 05/29/2019 1211   PHURINE 7.0 05/29/2019 1211   GLUCOSEU NEGATIVE 05/29/2019 1211   HGBUR SMALL (A) 05/29/2019 1211   BILIRUBINUR NEGATIVE 05/29/2019 1211   KETONESUR NEGATIVE 05/29/2019 1211   PROTEINUR 30 (A) 05/29/2019 1211   UROBILINOGEN 1.0 09/20/2010 1226   NITRITE NEGATIVE 05/29/2019 1211   LEUKOCYTESUR LARGE (A) 05/29/2019 1211   Sepsis Labs: @LABRCNTIP (procalcitonin:4,lacticidven:4)  ) Recent Results (from the past 240 hour(s))  Respiratory Panel by RT PCR (Flu A&B, Covid) - Nasopharyngeal Swab     Status: None   Collection Time: 05/29/19  1:58 PM    Specimen: Nasopharyngeal Swab  Result Value Ref Range Status   SARS Coronavirus 2 by RT PCR NEGATIVE NEGATIVE Final    Comment: (NOTE) SARS-CoV-2 target nucleic acids are NOT DETECTED. The SARS-CoV-2 RNA is generally detectable in upper respiratoy specimens during the acute phase of infection. The lowest concentration of SARS-CoV-2 viral copies this assay can detect is 131 copies/mL. A negative result does not preclude SARS-Cov-2 infection and should not be used as the sole basis for treatment or other patient management decisions. A negative result may occur with  improper specimen collection/handling, submission of specimen other than nasopharyngeal swab, presence of viral mutation(s) within the areas targeted by this assay, and inadequate number of viral copies (<131 copies/mL). A negative result must be combined with clinical observations, patient history, and epidemiological information. The expected result is Negative. Fact Sheet for Patients:  PinkCheek.be Fact Sheet for Healthcare Providers:  GravelBags.it This test is not yet ap proved or cleared by the [place] FDA and  has been authorized for detection and/or diagnosis of SARS-CoV-2 by FDA  under an Emergency Use Authorization (EUA). This EUA will remain  in effect (meaning this test can be used) for the duration of the COVID-19 declaration under Section 564(b)(1) of the Act, 21 U.S.C. section 360bbb-3(b)(1), unless the authorization is terminated or revoked sooner.    Influenza A by PCR NEGATIVE NEGATIVE Final   Influenza B by PCR NEGATIVE NEGATIVE Final    Comment: (NOTE) The Xpert Xpress SARS-CoV-2/FLU/RSV assay is intended as an aid in  the diagnosis of influenza from Nasopharyngeal swab specimens and  should not be used as a sole basis for treatment. Nasal washings and  aspirates are unacceptable for Xpert Xpress SARS-CoV-2/FLU/RSV  testing. Fact Sheet  for Patients: PinkCheek.be Fact Sheet for Healthcare Providers: GravelBags.it This test is not yet approved or cleared by the [place] FDA and  has been authorized for detection and/or diagnosis of SARS-CoV-2 by  FDA under an Emergency Use Authorization (EUA). This EUA will remain  in effect (meaning this test can be used) for the duration of the  Covid-19 declaration under Section 564(b)(1) of the Act, 21  U.S.C. section 360bbb-3(b)(1), unless the authorization is  terminated or revoked. Performed at [facility], [place] [address] [place], [place] [address]   MRSA PCR Screening     Status: None   Collection Time: 05/30/19  5:43 AM   Specimen: Nasal Mucosa; Nasopharyngeal  Result Value Ref Range Status   MRSA by PCR NEGATIVE NEGATIVE Final    Comment:        The GeneXpert MRSA Assay (FDA approved for NASAL specimens only), is one component of a comprehensive MRSA colonization surveillance program. It is not intended to diagnose MRSA infection nor to guide or monitor treatment for MRSA infections. Performed at [facility], [place] [address] [place], [place] [address]       Studies: No results found.  Scheduled Meds: . apixaban  2.5 mg Oral BID  . [START ON 06/01/2019] digoxin  0.125 mg Oral Daily  . digoxin  0.25 mg Oral Q2H  . feeding supplement (ENSURE ENLIVE)  237 mL Oral TID BM  . metoprolol tartrate  25 mg Oral Once  . metoprolol tartrate  50 mg Oral BID  . multivitamin with minerals  1 tablet Oral Daily  . pantoprazole  20 mg Oral Daily  . predniSONE  30 mg Oral BID WC  . sodium chloride flush  3 mL Intravenous Q12H  . terazosin  1 mg Oral QHS    Continuous Infusions: . sodium chloride    . amiodarone 30 mg/hr (05/31/19 0834)  . cefTRIAXone (ROCEPHIN)  IV 1 g (05/31/19 1425)  . diltiazem (CARDIZEM) infusion 7.5 mg/hr (05/31/19 1221)     LOS: 2 days     Kayleen Memos, MD [facility] Pager [phone]  If 7PM-7AM, please contact night-coverage [email or web address] Password [email or web address] 05/31/2019, 3:50 PM

## 2019-05-31 NOTE — Progress Notes (Signed)
OT Cancellation Note  Patient Details Name: Matthew Hickman MRN: [id number] DOB: Jan 29, 1926   Cancelled Treatment:     Spoke with RN, states patient is tachy with HR into 150s. Will check back as schedule allows.  Delbert Phenix OT OT office: [phone]   Rosemary Holms 05/31/2019, 9:35 AM

## 2019-05-31 NOTE — Progress Notes (Signed)
Progress Note  Patient Name: Matthew Hickman Date of Encounter: 05/31/2019  Primary Cardiologist: Sherren Mocha, MD   Subjective   No complaints at rest. Mildly hypotensive, diltiazem stopped and he promptly became very tachycardic. Diltiazem resumed, now VR 105-110, atypical atrial flutter.  Inpatient Medications    Scheduled Meds: . apixaban  2.5 mg Oral BID  . [START ON 06/01/2019] digoxin  0.125 mg Oral Daily  . digoxin  0.25 mg Oral Q2H  . feeding supplement (ENSURE ENLIVE)  237 mL Oral TID BM  . metoprolol tartrate  25 mg Oral Once  . metoprolol tartrate  50 mg Oral BID  . multivitamin with minerals  1 tablet Oral Daily  . pantoprazole  20 mg Oral Daily  . predniSONE  30 mg Oral BID WC  . sodium chloride flush  3 mL Intravenous Q12H  . terazosin  1 mg Oral QHS   Continuous Infusions: . sodium chloride    . amiodarone 30 mg/hr (05/31/19 0834)  . cefTRIAXone (ROCEPHIN)  IV 1 g (05/30/19 1255)  . diltiazem (CARDIZEM) infusion Stopped (05/31/19 1010)   PRN Meds: sodium chloride, acetaminophen, ondansetron (ZOFRAN) IV, sodium chloride flush   Vital Signs    Vitals:   05/31/19 0027 05/31/19 0424 05/31/19 0441 05/31/19 0746  BP:  112/65  108/72  Pulse:  (!) 127    Resp: 19 (!) 23 (!) 21 20  Temp:  97.6 F (36.4 C)  98 F (36.7 C)  TempSrc:  Oral  Oral  SpO2:  92%  98%  Weight:  58.6 kg    Height:        Intake/Output Summary (Last 24 hours) at 05/31/2019 1110 Last data filed at 05/31/2019 0426 Gross per 24 hour  Intake 781.56 ml  Output 500 ml  Net 281.56 ml   Last 3 Weights 05/31/2019 05/30/2019 04/10/2019  Weight (lbs) 129 lb 3 oz 124 lb 5.4 oz 128 lb 3.2 oz  Weight (kg) 58.6 kg 56.4 kg 58.151 kg      Telemetry    Atypical atrial flutter with RVR - Personally Reviewed  ECG    No new tracing - Personally Reviewed  Physical Exam  Lean, appears comfortable in bed GEN: No acute distress.   Neck: No JVD Cardiac: irregular, no murmurs, rubs, or  gallops.  Respiratory: Clear to auscultation bilaterally. GI: Soft, nontender, non-distended  MS: No edema; No deformity. Neuro:  Nonfocal. Poor visual acuity. Very HOH. Psych: Normal affect   Labs    High Sensitivity Troponin:  No results for input(s): TROPONINIHS in the last 720 hours.    Chemistry Recent Labs  Lab 05/29/19 0959 05/30/19 0341 05/31/19 0235  NA 142 140 138  K 3.9 4.2 4.3  CL 105 102 100  CO2 25 27 28   GLUCOSE 96 166* 174*  BUN 33* 34* 39*  CREATININE 0.95 0.84 0.84  CALCIUM 8.5* 8.6* 8.6*  GFRNONAA >60 >60 >60  GFRAA >60 >60 >60  ANIONGAP 12 11 10      Hematology Recent Labs  Lab 05/29/19 0959 05/30/19 0341 05/31/19 0810  WBC 10.2 9.0 11.9*  RBC 3.00* 2.77* 3.16*  HGB 10.2* 9.1* 10.4*  HCT 31.1* 28.2* 32.1*  MCV 103.7* 101.8* 101.6*  MCH 34.0 32.9 32.9  MCHC 32.8 32.3 32.4  RDW 20.1* 20.4* 20.3*  PLT 68* 65* 83*    BNPNo results for input(s): BNP, PROBNP in the last 168 hours.   DDimer No results for input(s): DDIMER in the last 168 hours.  Radiology    DG CHEST PORT 1 VIEW  Result Date: 05/29/2019 CLINICAL DATA:  Pain following fall EXAM: PORTABLE CHEST 1 VIEW COMPARISON:  April 04, 2019 FINDINGS: There is atelectatic change in the left base. Lungs elsewhere are clear. Heart size and pulmonary vascularity are normal. No adenopathy. There is aortic atherosclerosis. No pneumothorax. No appreciable bone lesions. IMPRESSION: Left base atelectasis. Lungs elsewhere clear. Stable cardiac silhouette. No pneumothorax. Aortic Atherosclerosis (ICD10-I70.0). Electronically Signed   By: Lowella Grip III M.D.   On: 05/29/2019 13:27    Cardiac Studies   Echo 04/05/19  1. Left ventricular ejection fraction, by estimation, is 55 to 60%. The  left ventricle has normal function. The left ventricle has no regional  wall motion abnormalities. Left ventricular diastolic parameters are  consistent with Grade I diastolic  dysfunction (impaired  relaxation).  2. Right ventricular systolic function is normal. The right ventricular  size is normal. There is moderately elevated pulmonary artery systolic  pressure. The estimated right ventricular systolic pressure is [id number] mmHg.  3. The mitral valve is normal in structure and function. Trivial mitral  valve regurgitation. No evidence of mitral stenosis.  4. The aortic valve is tricuspid. Aortic valve regurgitation is not  visualized. No aortic stenosis is present.  5. Aortic dilatation noted. There is mild dilatation of the ascending  aorta measuring 40 mm.  6. The inferior vena cava is dilated in size with <50% respiratory  variability, suggesting right atrial pressure of 15 mmHg.   Patient Profile     86 y.o. male with HTN, giant cell arteritis with visual involvement, presenting with atypical (left atrial) flutter with RVR  Assessment & Plan    Will add digoxin. Continue diltiazem until ventricular rates are <100, unless he has symptomatic hypotension.  If we cannot achieve rate control with meds, consider TEE-cardioversion tomorrow. On apixaban, no bleeding. Still moderately thrombocytopenic, but plt count slightly better. Long term, I a worry about the safety of anticoagulation due to his poor eyesight and risk of falls.     For questions or updates, please contact [place] Please consult [email or web address] for contact info under        Signed, Sanda Klein, MD  05/31/2019, 11:10 AM

## 2019-05-31 NOTE — Progress Notes (Signed)
cardizem drip increased by 2.5 ml/hr per order parameters----rate is now running at 7.5

## 2019-05-31 NOTE — Progress Notes (Signed)
cardizem drip prev stopped---amio drip continuing IV infusion, patient still tachy 150's even 45 min after metoprolol administration---Dr. [place] Crane advised, cardiology advised, cardizem drip will be restarted

## 2019-06-01 DIAGNOSIS — M316 Other giant cell arteritis: Secondary | ICD-10-CM | POA: Diagnosis not present

## 2019-06-01 DIAGNOSIS — D539 Nutritional anemia, unspecified: Secondary | ICD-10-CM | POA: Diagnosis not present

## 2019-06-01 DIAGNOSIS — I484 Atypical atrial flutter: Secondary | ICD-10-CM | POA: Diagnosis not present

## 2019-06-01 DIAGNOSIS — I4891 Unspecified atrial fibrillation: Secondary | ICD-10-CM | POA: Diagnosis not present

## 2019-06-01 LAB — CBC
HCT: 27.7 % — ABNORMAL LOW (ref 39.0–52.0)
Hemoglobin: 9.2 g/dL — ABNORMAL LOW (ref 13.0–17.0)
MCH: 33 pg (ref 26.0–34.0)
MCHC: 33.2 g/dL (ref 30.0–36.0)
MCV: 99.3 fL (ref 80.0–100.0)
Platelets: 71 10*3/uL — ABNORMAL LOW (ref 150–400)
RBC: 2.79 MIL/uL — ABNORMAL LOW (ref 4.22–5.81)
RDW: 20.1 % — ABNORMAL HIGH (ref 11.5–15.5)
WBC: 9.6 10*3/uL (ref 4.0–10.5)
nRBC: 0.2 % (ref 0.0–0.2)

## 2019-06-01 LAB — BASIC METABOLIC PANEL
Anion gap: 9 (ref 5–15)
BUN: 40 mg/dL — ABNORMAL HIGH (ref 8–23)
CO2: 27 mmol/L (ref 22–32)
Calcium: 8.7 mg/dL — ABNORMAL LOW (ref 8.9–10.3)
Chloride: 101 mmol/L (ref 98–111)
Creatinine, Ser: 0.77 mg/dL (ref 0.61–1.24)
GFR calc Af Amer: >60 mL/min (ref >60)
GFR calc non Af Amer: >60 mL/min (ref >60)
Glucose, Bld: 201 mg/dL — ABNORMAL HIGH (ref 70–99)
Potassium: 4.3 mmol/L (ref 3.5–5.1)
Sodium: 137 mmol/L (ref 135–145)

## 2019-06-01 MED ORDER — METOPROLOL TARTRATE 50 MG PO TABS
75.0000 mg | ORAL_TABLET | Freq: Two times a day (BID) | ORAL | Status: DC
  Administered 2019-06-01 – 2019-06-05 (×10): 75 mg via ORAL
  Filled 2019-06-01 (×9): qty 1

## 2019-06-01 MED ORDER — CIPROFLOXACIN HCL 500 MG PO TABS
500.0000 mg | ORAL_TABLET | Freq: Two times a day (BID) | ORAL | Status: AC
  Administered 2019-06-01 – 2019-06-06 (×10): 500 mg via ORAL
  Filled 2019-06-01 (×10): qty 1

## 2019-06-01 NOTE — Progress Notes (Addendum)
RN notified by central telemetry of pt having long pauses. Charge nurse and rapid response RN notified. Rhythm continues to be A. flutter. A. Flutter 3:1. Will continue to monitor.

## 2019-06-01 NOTE — Evaluation (Signed)
Occupational Therapy Evaluation Patient Details Name: Matthew Hickman MRN: [id number] DOB: 1925/10/27 Today's Date: 06/01/2019    History of Present Illness 86 yo admitted after fall at home with weakness. PMHx: HTN, skin CA, atrial ectopy, bil clubfoot surgery, giant cell arteritis, HOH, visual impairment   Clinical Impression   PTA pt living with wife and son, was helping take care of wife who is amputee. At time of eval, pt completed bed mobility at min A and sit <> stands at min A +2 with RW. Pt completed functional mobility at min A with RW to bathroom for toilet transfer. Pt had BM with peak HR to 157 bpm. Min A needed to complete peri care. Pt then returned to chair and HR recovered quickly. Given current status, recommend SNF at d/c to continue to progress BADL safely prior to returning home. Will continue to follow per POC listed below.     Follow Up Recommendations  SNF    Equipment Recommendations  None recommended by OT    Recommendations for Other Services       Precautions / Restrictions Precautions Precautions: Fall Precaution Comments: watch HR Restrictions Weight Bearing Restrictions: No      Mobility Bed Mobility Overal bed mobility: Needs Assistance Bed Mobility: Supine to Sit     Supine to sit: Min assist     General bed mobility comments: assist to elevate trunk from surface with HOB 35 degrees, cues for sequence  Transfers Overall transfer level: Needs assistance   Transfers: Sit to/from Stand Sit to Stand: Min assist;+2 safety/equipment         General transfer comment: cues for hand placement with assist to rise from bed and toilet    Balance Overall balance assessment: Needs assistance;History of Falls Sitting-balance support: Feet supported Sitting balance-Leahy Scale: Fair Sitting balance - Comments: guarding for static stability EOB; posterior lean when donning sock   Standing balance support: Single extremity supported;During  functional activity Standing balance-Leahy Scale: Poor Standing balance comment: bil UE support for standing                           ADL either performed or assessed with clinical judgement   ADL Overall ADL's : Needs assistance/impaired Eating/Feeding: Set up;Sitting Eating/Feeding Details (indicate cue type and reason): set up with chair for lunch Grooming: Minimal assistance;Standing   Upper Body Bathing: Minimal assistance;Sitting   Lower Body Bathing: Moderate assistance;Sit to/from stand;Sitting/lateral leans   Upper Body Dressing : Minimal assistance;Sitting   Lower Body Dressing: Moderate assistance;Sit to/from stand;Sitting/lateral leans Lower Body Dressing Details (indicate cue type and reason): to pull on socks while sitting EOB. Pt needing assist for sitting balance to manage posterior lean and to pull up remainder of sock over heel Toilet Transfer: Moderate assistance;Regular Materials engineer Details (indicate cue type and reason): mod A to power into standing from low toilet Toileting- Clothing Manipulation and Hygiene: Minimal assistance;Sitting/lateral lean;Sit to/from stand Toileting - Clothing Manipulation Details (indicate cue type and reason): pt able to complete 90% of peri care with lateral leans. Min A needed to complete task thoroughly Tub/ Shower Transfer: Minimal assistance;Ambulation;Shower seat;Rolling walker;Grab bars   Functional mobility during ADLs: Minimal assistance;Cueing for safety;Rolling walker       Vision   Additional Comments: reports increased visual decline in past year     Perception     Praxis      Pertinent Vitals/Pain Pain Assessment: No/denies pain  Hand Dominance     Extremity/Trunk Assessment Upper Extremity Assessment Upper Extremity Assessment: Generalized weakness   Lower Extremity Assessment Lower Extremity Assessment: Generalized weakness       Communication  Communication Communication: HOH   Cognition Arousal/Alertness: Awake/alert Behavior During Therapy: WFL for tasks assessed/performed Overall Cognitive Status: Impaired/Different from baseline Area of Impairment: Orientation;Memory;Following commands;Safety/judgement                 Orientation Level: Disoriented to;Time   Memory: Decreased short-term memory Following Commands: Follows one step commands consistently;Follows one step commands with increased time Safety/Judgement: Decreased awareness of deficits     General Comments: increased time and cueing for basic mobility tasks   General Comments       Exercises General Exercises - Lower Extremity Short Arc Quad: AROM;Both;Seated;5 reps   Shoulder Instructions      Home Living Family/patient expects to be discharged to:: Private residence Living Arrangements: Spouse/significant other;Children Available Help at Discharge: Family;Available PRN/intermittently Type of Home: House Home Access: Level entry     Home Layout: Multi-level Alternate Level Stairs-Number of Steps: flight with stair lift   Bathroom Shower/Tub: Occupational psychologist: Standard     Home Equipment: Grab bars - tub/shower;Grab bars - toilet;Walker - 2 wheels;Cane - single point          Prior Functioning/Environment Level of Independence: Independent with assistive device(s)        Comments: pt reports he was driving and not using anything to walk with until 2 months ago. since that time progressive visual loss and was using RW at home and assisting wife who is an amputee. son lives in the basement level but has disabilities himself        OT Problem List: Decreased strength;Decreased knowledge of use of DME or AE;Decreased knowledge of precautions;Decreased activity tolerance;Cardiopulmonary status limiting activity;Impaired balance (sitting and/or standing)      OT Treatment/Interventions: Self-care/ADL  training;Therapeutic exercise;Patient/family education;Balance training;Energy conservation;Therapeutic activities;DME and/or AE instruction    OT Goals(Current goals can be found in the care plan section) Acute Rehab OT Goals Patient Stated Goal: be able to walk and get stronger OT Goal Formulation: With patient Time For Goal Achievement: 06/15/19  OT Frequency: Min 2X/week   Barriers to D/C:            Co-evaluation PT/OT/SLP Co-Evaluation/Treatment: Yes Reason for Co-Treatment: For patient/therapist safety;To address functional/ADL transfers PT goals addressed during session: Mobility/safety with mobility;Proper use of DME;Strengthening/ROM OT goals addressed during session: ADL's and self-care;Proper use of Adaptive equipment and DME;Strengthening/ROM      AM-PAC OT "6 Clicks" Daily Activity     Outcome Measure Help from another person eating meals?: None Help from another person taking care of personal grooming?: A Little Help from another person toileting, which includes using toliet, bedpan, or urinal?: A Lot Help from another person bathing (including washing, rinsing, drying)?: A Lot Help from another person to put on and taking off regular upper body clothing?: A Little Help from another person to put on and taking off regular lower body clothing?: A Lot 6 Click Score: 16   End of Session Equipment Utilized During Treatment: Gait belt;Rolling walker Nurse Communication: Mobility status  Activity Tolerance: Patient tolerated treatment well Patient left: in chair;with call bell/phone within reach;with chair alarm set  OT Visit Diagnosis: Other abnormalities of gait and mobility (R26.89);History of falling (Z91.81);Unsteadiness on feet (R26.81);Muscle weakness (generalized) (M62.81)  Time: [id number] OT Time Calculation (min): 37 min Charges:  OT General Charges $OT Visit: 1 Visit OT Evaluation $OT Eval Moderate Complexity: [place], MSOT,  OTR/L [place] [facility] Office Number: [phone] Pager: [phone]  Zenovia Jarred 06/01/2019, 2:17 PM

## 2019-06-01 NOTE — Progress Notes (Signed)
Foam dressing to both elbows changed saturated with serosanguinous drainage.

## 2019-06-01 NOTE — Plan of Care (Signed)
  Problem: Clinical Measurements: Goal: Cardiovascular complication will be avoided Outcome: Progressing   Problem: Nutrition: Goal: Adequate nutrition will be maintained Outcome: Progressing   Problem: Coping: Goal: Level of anxiety will decrease Outcome: Progressing   Problem: Pain Managment: Goal: General experience of comfort will improve Outcome: Progressing   Problem: Safety: Goal: Ability to remain free from injury will improve Outcome: Progressing   Problem: Skin Integrity: Goal: Risk for impaired skin integrity will decrease Outcome: Progressing

## 2019-06-01 NOTE — Progress Notes (Signed)
RN paged Cardiology MD regarding a. Flutter rhythm and pauses. Cardiology MD updated and notified. No new orders given.  Will continue to monitor.

## 2019-06-01 NOTE — Progress Notes (Signed)
Physical Therapy Treatment Patient Details Name: Matthew Hickman MRN: [id number] DOB: 1925/02/24 Today's Date: 06/01/2019    History of Present Illness 86 yo admitted after fall at home with weakness. PMHx: HTN, skin CA, atrial ectopy, bil clubfoot surgery, giant cell arteritis, HOH, visual impairment    PT Comments    Pt initially not excited about mobility but willing to attempt after education. Pt reports feeling weaker than on last visit but able to progress to in room gait, getting to bathroom and HR maintained around 127 with activity 80 at rest. SpO2 89-95% on RA. Pt with peak to 157 after BM and walk out of bathroom with return to 97 end of session in chair.     Follow Up Recommendations  SNF;Supervision/Assistance - 24 hour     Equipment Recommendations  None recommended by PT    Recommendations for Other Services       Precautions / Restrictions Precautions Precautions: Fall Precaution Comments: watch HR Restrictions Weight Bearing Restrictions: No    Mobility  Bed Mobility Overal bed mobility: Needs Assistance Bed Mobility: Supine to Sit     Supine to sit: Min assist     General bed mobility comments: assist to elevate trunk from surface with HOB 35 degrees, cues for sequence  Transfers Overall transfer level: Needs assistance   Transfers: Sit to/from Stand Sit to Stand: Min assist;+2 safety/equipment         General transfer comment: cues for hand placement with assist to rise from bed and toilet  Ambulation/Gait Ambulation/Gait assistance: Min assist;+2 safety/equipment Gait Distance (Feet): 20 Feet Assistive device: Rolling walker (2 wheeled) Gait Pattern/deviations: Step-through pattern;Decreased stride length;Trunk flexed   Gait velocity interpretation: <1.8 ft/sec, indicate of risk for recurrent falls General Gait Details: cues for posture, direction and safety with +2 fo lines and safety, pt then walked 4' to bathroom door and chair limited  by HR rise to 157   Stairs             Wheelchair Mobility    Modified Rankin (Stroke Patients Only)       Balance Overall balance assessment: Needs assistance;History of Falls   Sitting balance-Leahy Scale: Fair Sitting balance - Comments: guarding for stability EOB     Standing balance-Leahy Scale: Poor Standing balance comment: bil UE support for standing                            Cognition Arousal/Alertness: Awake/alert Behavior During Therapy: WFL for tasks assessed/performed Overall Cognitive Status: Impaired/Different from baseline Area of Impairment: Orientation;Memory;Following commands;Safety/judgement                 Orientation Level: Disoriented to;Time   Memory: Decreased short-term memory Following Commands: Follows one step commands consistently;Follows one step commands with increased time Safety/Judgement: Decreased awareness of deficits            Exercises General Exercises - Lower Extremity Short Arc Quad: AROM;Both;Seated;5 reps    General Comments        Pertinent Vitals/Pain Pain Assessment: No/denies pain    Home Living                      Prior Function            PT Goals (current goals can now be found in the care plan section) Progress towards PT goals: Progressing toward goals    Frequency    Min 3X/week  PT Plan Current plan remains appropriate    Co-evaluation PT/OT/SLP Co-Evaluation/Treatment: Yes Reason for Co-Treatment: For patient/therapist safety PT goals addressed during session: Mobility/safety with mobility;Proper use of DME;Strengthening/ROM        AM-PAC PT "6 Clicks" Mobility   Outcome Measure  Help needed turning from your back to your side while in a flat bed without using bedrails?: A Little Help needed moving from lying on your back to sitting on the side of a flat bed without using bedrails?: A Little Help needed moving to and from a bed to a chair  (including a wheelchair)?: A Little Help needed standing up from a chair using your arms (e.g., wheelchair or bedside chair)?: A Little Help needed to walk in hospital room?: A Lot Help needed climbing 3-5 steps with a railing? : A Lot 6 Click Score: 16    End of Session Equipment Utilized During Treatment: Gait belt Activity Tolerance: Patient tolerated treatment well Patient left: in chair;with call bell/phone within reach;with chair alarm set Nurse Communication: Mobility status;Precautions PT Visit Diagnosis: Difficulty in walking, not elsewhere classified (R26.2);Other abnormalities of gait and mobility (R26.89);Muscle weakness (generalized) (M62.81);History of falling (Z91.81)     Time: [id number] PT Time Calculation (min) (ACUTE ONLY): 36 min  Charges:  $Gait Training: 8-22 mins                     Bayard Males, PT Acute Rehabilitation Services Pager: [phone] Office: [phone]    Khalen Styer B Jovonda Selner 06/01/2019, 11:29 AM

## 2019-06-01 NOTE — Anesthesia Preprocedure Evaluation (Addendum)
Anesthesia Evaluation  Patient identified by MRN, date of birth, ID band Patient awake    Reviewed: Allergy & Precautions, H&P , NPO status , Patient's Chart, lab work & pertinent test results  Airway Mallampati: I  TM Distance: >3 FB Neck ROM: Full    Dental no notable dental hx. (+) Poor Dentition, Chipped, Missing, Dental Advisory Given   Pulmonary neg pulmonary ROS,    Pulmonary exam normal breath sounds clear to auscultation       Cardiovascular Exercise Tolerance: Good hypertension, Pt. on medications and Pt. on home beta blockers negative cardio ROS Normal cardiovascular exam+ dysrhythmias Atrial Fibrillation  Rhythm:Irregular Rate:Abnormal  04/05/2019 ECHO: EF 55-60%, grade 1 DD, valves OK   Neuro/Psych negative neurological ROS  negative psych ROS   GI/Hepatic negative GI ROS, Neg liver ROS,   Endo/Other  negative endocrine ROS  Renal/GU negative Renal ROS  negative genitourinary   Musculoskeletal negative musculoskeletal ROS (+)   Abdominal   Peds negative pediatric ROS (+)  Hematology negative hematology ROS (+) Blood dyscrasia, anemia ,   Anesthesia Other Findings   Reproductive/Obstetrics negative OB ROS                           Anesthesia Physical Anesthesia Plan  ASA: IV  Anesthesia Plan: General   Post-op Pain Management:    Induction: Intravenous  PONV Risk Score and Plan: 2  Airway Management Planned: Nasal Cannula, Simple Face Mask and Mask  Additional Equipment:   Intra-op Plan:   Post-operative Plan: Extubation in OR  Informed Consent: I have reviewed the patients History and Physical, chart, labs and discussed the procedure including the risks, benefits and alternatives for the proposed anesthesia with the patient or authorized representative who has indicated his/her understanding and acceptance.       Plan Discussed with: CRNA, Surgeon and  Anesthesiologist  Anesthesia Plan Comments: (  )        Anesthesia Quick Evaluation

## 2019-06-01 NOTE — Progress Notes (Signed)
Progress Note  Patient Name: Matthew Hickman Date of Encounter: 06/01/2019  Primary Cardiologist: Sherren Mocha, MD   Subjective   Remains in atrial flutter with rapid ventricular response despite treatment with intravenous amiodarone, oral metoprolol and digoxin.  Blood pressure borderline low. Thankfully he is pretty asymptomatic at rest.  Heart rate 100-110 at rest, increases to 140-150 with minimal activity.  Inpatient Medications    Scheduled Meds: . apixaban  2.5 mg Oral BID  . digoxin  0.125 mg Oral Daily  . feeding supplement (ENSURE ENLIVE)  237 mL Oral TID BM  . metoprolol tartrate  75 mg Oral BID  . multivitamin with minerals  1 tablet Oral Daily  . pantoprazole  20 mg Oral Daily  . predniSONE  30 mg Oral BID WC  . sodium chloride flush  3 mL Intravenous Q12H  . terazosin  1 mg Oral QHS   Continuous Infusions: . sodium chloride    . amiodarone 30 mg/hr (06/01/19 0848)  . cefTRIAXone (ROCEPHIN)  IV 1 g (05/31/19 1425)  . diltiazem (CARDIZEM) infusion 10 mg/hr (06/01/19 0800)   PRN Meds: sodium chloride, acetaminophen, ondansetron (ZOFRAN) IV, sodium chloride flush   Vital Signs    Vitals:   06/01/19 0355 06/01/19 0642 06/01/19 0730 06/01/19 0907  BP: (!) 114/56  114/62   Pulse: (!) 59  65 98  Resp: 20  20   Temp: 97.6 F (36.4 C)  (!) 97.5 F (36.4 C)   TempSrc: Oral  Other (Comment)   SpO2: 92%  95%   Weight:  53.9 kg    Height:        Intake/Output Summary (Last 24 hours) at 06/01/2019 0932 Last data filed at 06/01/2019 0800 Gross per 24 hour  Intake 2364.75 ml  Output 850 ml  Net 1514.75 ml   Last 3 Weights 06/01/2019 05/31/2019 05/30/2019  Weight (lbs) 118 lb 13.3 oz 129 lb 3 oz 124 lb 5.4 oz  Weight (kg) 53.9 kg 58.6 kg 56.4 kg      Telemetry    Atypical atrial flutter with RVR- Personally Reviewed  ECG    No new tracing- Personally Reviewed  Physical Exam  Appears comfortable lying with head of bed elevated at about 20 degrees GEN:  No acute distress.   Neck: No JVD Cardiac:  Rapid irregular rhythm, no murmurs, rubs, or gallops.  Respiratory: Clear to auscultation bilaterally. GI: Soft, nontender, non-distended  MS: No edema; No deformity. Neuro:  Nonfocal  Psych: Normal affect   Labs    High Sensitivity Troponin:  No results for input(s): TROPONINIHS in the last 720 hours.    Chemistry Recent Labs  Lab 05/30/19 0341 05/31/19 0235 06/01/19 0248  NA 140 138 137  K 4.2 4.3 4.3  CL 102 100 101  CO2 27 28 27   GLUCOSE 166* 174* 201*  BUN 34* 39* 40*  CREATININE 0.84 0.84 0.77  CALCIUM 8.6* 8.6* 8.7*  GFRNONAA >60 >60 >60  GFRAA >60 >60 >60  ANIONGAP 11 10 9      Hematology Recent Labs  Lab 05/30/19 0341 05/31/19 0810 06/01/19 0248  WBC 9.0 11.9* 9.6  RBC 2.77* 3.16* 2.79*  HGB 9.1* 10.4* 9.2*  HCT 28.2* 32.1* 27.7*  MCV 101.8* 101.6* 99.3  MCH 32.9 32.9 33.0  MCHC 32.3 32.4 33.2  RDW 20.4* 20.3* 20.1*  PLT 65* 83* 71*    BNPNo results for input(s): BNP, PROBNP in the last 168 hours.   DDimer No results for input(s): DDIMER  in the last 168 hours.   Radiology    No results found.  Cardiac Studies   Echo 04/05/19  1. Left ventricular ejection fraction, by estimation, is 55 to 60%. The  left ventricle has normal function. The left ventricle has no regional  wall motion abnormalities. Left ventricular diastolic parameters are  consistent with Grade I diastolic  dysfunction (impaired relaxation).  2. Right ventricular systolic function is normal. The right ventricular  size is normal. There is moderately elevated pulmonary artery systolic  pressure. The estimated right ventricular systolic pressure is [id number] mmHg.  3. The mitral valve is normal in structure and function. Trivial mitral  valve regurgitation. No evidence of mitral stenosis.  4. The aortic valve is tricuspid. Aortic valve regurgitation is not  visualized. No aortic stenosis is present.  5. Aortic dilatation noted. There  is mild dilatation of the ascending  aorta measuring 40 mm.  6. The inferior vena cava is dilated in size with <50% respiratory  variability, suggesting right atrial pressure of 15 mmHg.   Patient Profile     86 y.o. male with hypertension giant cell arteritis with visual involvement, presenting with atypical atrial flutter with rapid ventricular response, difficult to rate control, moderate thrombocytopenia without bleeding complications  Assessment & Plan    Despite treatment with amiodarone and 2 other rate control medications, remains quite tachycardic.  Increase metoprolol to 75 mg twice daily today.  Keep on IV amiodarone 1 more day.  Tentatively scheduled for TEE-cardioversion tomorrow at 0830 hrs. This procedure has been fully reviewed with the patient and written informed consent has been obtained. Hemoglobin and platelet count is stable.  Macrocytosis with normal vitamin B12 level suggests possible myelodysplasia.  Consider hematology consult.      For questions or updates, please contact [place] Please consult [email or web address] for contact info under        Signed, Sanda Klein, MD  06/01/2019, 9:32 AM

## 2019-06-01 NOTE — Progress Notes (Signed)
PROGRESS NOTE  Matthew Hickman [id number] DOB: 1925/05/13 DOA: 05/29/2019 PCP: [name], MD  HPI/Recap of past 24 hours: Matthew Hickman is a 86 y.o. male with medical history significant of hypertension, paroxysmal A. fib, newly diagnosed giant cell arteritis with bilateral vision impairment on steroid, hard hearing, presented with fall.  Last month, he was diagnosed with giant cell arteritis, and has been on high-dose of steroid through the month, poor eyesight.  Last month on same admission he had episode of A. Fib?, but eventually the diagnosis of A. fib was not established, and sent home with p.o. Cardizem and metoprolol.  ED Course: CT head shows chronic changes, and patient was found in rapid A. fib.  Cardiology consulted and [facility] asked to admit.  06/01/19: seen and examined.  He denies any chest pain, palpitations or dyspnea.  Denies any nausea or dizziness.  Currently on amiodarone drip, diltiazem drip and oral digoxin.  Has completed 3 doses of 0.25 mg every 2 hours on 05/31/2019.  His heart rate fluctuates 80-140's.  Metoprolol dose increased to 75 mg twice daily.  Per cardiology we will keep on IV amiodarone for 1 more day.  Tentative schedule for TEE cardioversion tomorrow at 8:30 AM.   Assessment/Plan: Active Problems:   A-fib (HCC)  A. flutter with RVR Personally reviewed twelve-lead EKG done on admission which shows a flutter rate of 142 Currently on Cardizem drip, amiodarone drip and digoxin p.o. 0.125 mg daily. Completed 3 doses of 0.25 mg every 2 hours of digoxin on 05/31/2019.  Continues to have a flutter with rapid ventricular response P.o. Lopressor dose increased to 75 mg twice daily. Plan for TEE directed cardioversion tomorrow. Continue amiodarone drip and diltiazem drip. Continue Eliquis for CVA prevention Last 2D echo on 04/05/19> normal LVEF 55 to 60% with grade 1 diastolic dysfunction.  Resolved hypotensive, likely iatrogenic Maintain MAP greater than  65 Continue to monitor vital signs  Pseudomonas aeruginosa UTI Presented with pyuria Urine culture growing 60,000 colonies of Pseudomonas aeruginosa, awaiting sensitivities report Received 3 days of Rocephin. Switch to Cipro x5 days.  Acute thrombocytopenia in the setting of acute illness Platelets 83K>> 71K No overt bleeding  Giant cell arteritis Continue prednisone home regimen Continue PPI for GI prophylaxis  Moderate pulmonary hypertension Euvolemic on exam Management per cardiology's recommendation  Macrocytic anemia with acute blood loss Baseline hemoglobin 11 Hemoglobin 9.1> 10.4>> 9.2 MCV of 101>> 99.2 Possibly dilutional No overt bleeding  BPH Continue Terazosin.   Monitor urine output  DVT prophylaxis:  Eliquis Code Status: Full code Family Communication:  Will call family if okay with the patient.  Disposition Plan:  Patient is from home.  Anticipate discharge to home in the next 48 hours or when cardiology signs off.  Barrier to discharge: Ongoing management for A. fib/A-flutter with RVR with possible TEE directed cardioversion tomorrow.  Consults called: cardiology       Objective: Vitals:   06/01/19 0642 06/01/19 0730 06/01/19 0907 06/01/19 1110  BP:  114/62    Pulse:  65 98 (!) 117  Resp:  20    Temp:  (!) 97.5 F (36.4 C)    TempSrc:  Other (Comment)    SpO2:  95%  95%  Weight: 53.9 kg     Height:        Intake/Output Summary (Last 24 hours) at 06/01/2019 1211 Last data filed at 06/01/2019 1100 Gross per 24 hour  Intake 2691.43 ml  Output 850 ml  Net 1841.43  ml   Filed Weights   05/30/19 0502 05/31/19 0424 06/01/19 0642  Weight: 56.4 kg 58.6 kg 53.9 kg    Exam:  . General: 86 y.o. year-old male well-developed well-nourished no acute stress.  Alert and interactive.  Very hard of hearing.   . Cardiovascular: Irregular rate and rhythm no rubs or gallops.   Marland Kitchen Respiratory: Mild rales at bases no wheezing noted.   . Abdomen: Soft  Nontender Normal Bowel Sounds Present. . Musculoskeletal: Trace lower extremity edema bilaterally.   Marland Kitchen Psychiatry: Mood is appropriate for condition and setting.  Data Reviewed: CBC: Recent Labs  Lab 05/29/19 0959 05/30/19 0341 05/31/19 0810 06/01/19 0248  WBC 10.2 9.0 11.9* 9.6  HGB 10.2* 9.1* 10.4* 9.2*  HCT 31.1* 28.2* 32.1* 27.7*  MCV 103.7* 101.8* 101.6* 99.3  PLT 68* 65* 83* 71*   Basic Metabolic Panel: Recent Labs  Lab 05/29/19 0959 05/30/19 0341 05/31/19 0235 06/01/19 0248  NA 142 140 138 137  K 3.9 4.2 4.3 4.3  CL 105 102 100 101  CO2 25 27 28 27   GLUCOSE 96 166* 174* 201*  BUN 33* 34* 39* 40*  CREATININE 0.95 0.84 0.84 0.77  CALCIUM 8.5* 8.6* 8.6* 8.7*  MG  --  1.9  --   --    GFR: Estimated Creatinine Clearance: 45.9 mL/min (by C-G formula based on SCr of 0.77 mg/dL). Liver Function Tests: No results for input(s): AST, ALT, ALKPHOS, BILITOT, PROT, ALBUMIN in the last 168 hours. No results for input(s): LIPASE, AMYLASE in the last 168 hours. No results for input(s): AMMONIA in the last 168 hours. Coagulation Profile: No results for input(s): INR, PROTIME in the last 168 hours. Cardiac Enzymes: No results for input(s): CKTOTAL, CKMB, CKMBINDEX, TROPONINI in the last 168 hours. BNP (last 3 results) No results for input(s): PROBNP in the last 8760 hours. HbA1C: No results for input(s): HGBA1C in the last 72 hours. CBG: Recent Labs  Lab 05/29/19 1918  GLUCAP 218*   Lipid Profile: No results for input(s): CHOL, HDL, LDLCALC, TRIG, CHOLHDL, LDLDIRECT in the last 72 hours. Thyroid Function Tests: No results for input(s): TSH, T4TOTAL, FREET4, T3FREE, THYROIDAB in the last 72 hours. Anemia Panel: No results for input(s): VITAMINB12, FOLATE, FERRITIN, TIBC, IRON, RETICCTPCT in the last 72 hours. Urine analysis:    Component Value Date/Time   COLORURINE YELLOW 05/29/2019 1211   APPEARANCEUR HAZY (A) 05/29/2019 1211   LABSPEC 1.015 05/29/2019 1211    PHURINE 7.0 05/29/2019 1211   GLUCOSEU NEGATIVE 05/29/2019 1211   HGBUR SMALL (A) 05/29/2019 1211   BILIRUBINUR NEGATIVE 05/29/2019 1211   KETONESUR NEGATIVE 05/29/2019 1211   PROTEINUR 30 (A) 05/29/2019 1211   UROBILINOGEN 1.0 09/20/2010 1226   NITRITE NEGATIVE 05/29/2019 1211   LEUKOCYTESUR LARGE (A) 05/29/2019 1211   Sepsis Labs: @LABRCNTIP (procalcitonin:4,lacticidven:4)  ) Recent Results (from the past 240 hour(s))  Respiratory Panel by RT PCR (Flu A&B, Covid) - Nasopharyngeal Swab     Status: None   Collection Time: 05/29/19  1:58 PM   Specimen: Nasopharyngeal Swab  Result Value Ref Range Status   SARS Coronavirus 2 by RT PCR NEGATIVE NEGATIVE Final    Comment: (NOTE) SARS-CoV-2 target nucleic acids are NOT DETECTED. The SARS-CoV-2 RNA is generally detectable in upper respiratoy specimens during the acute phase of infection. The lowest concentration of SARS-CoV-2 viral copies this assay can detect is 131 copies/mL. A negative result does not preclude SARS-Cov-2 infection and should not be used as the sole basis for  treatment or other patient management decisions. A negative result may occur with  improper specimen collection/handling, submission of specimen other than nasopharyngeal swab, presence of viral mutation(s) within the areas targeted by this assay, and inadequate number of viral copies (<131 copies/mL). A negative result must be combined with clinical observations, patient history, and epidemiological information. The expected result is Negative. Fact Sheet for Patients:  PinkCheek.be Fact Sheet for Healthcare Providers:  GravelBags.it This test is not yet ap proved or cleared by the [place] FDA and  has been authorized for detection and/or diagnosis of SARS-CoV-2 by FDA under an Emergency Use Authorization (EUA). This EUA will remain  in effect (meaning this test can be used) for the duration of  the COVID-19 declaration under Section 564(b)(1) of the Act, 21 U.S.C. section 360bbb-3(b)(1), unless the authorization is terminated or revoked sooner.    Influenza A by PCR NEGATIVE NEGATIVE Final   Influenza B by PCR NEGATIVE NEGATIVE Final    Comment: (NOTE) The Xpert Xpress SARS-CoV-2/FLU/RSV assay is intended as an aid in  the diagnosis of influenza from Nasopharyngeal swab specimens and  should not be used as a sole basis for treatment. Nasal washings and  aspirates are unacceptable for Xpert Xpress SARS-CoV-2/FLU/RSV  testing. Fact Sheet for Patients: PinkCheek.be Fact Sheet for Healthcare Providers: GravelBags.it This test is not yet approved or cleared by the [place] FDA and  has been authorized for detection and/or diagnosis of SARS-CoV-2 by  FDA under an Emergency Use Authorization (EUA). This EUA will remain  in effect (meaning this test can be used) for the duration of the  Covid-19 declaration under Section 564(b)(1) of the Act, 21  U.S.C. section 360bbb-3(b)(1), unless the authorization is  terminated or revoked. Performed at [facility], [place] [address] [place], [place] [address]   MRSA PCR Screening     Status: None   Collection Time: 05/30/19  5:43 AM   Specimen: Nasal Mucosa; Nasopharyngeal  Result Value Ref Range Status   MRSA by PCR NEGATIVE NEGATIVE Final    Comment:        The GeneXpert MRSA Assay (FDA approved for NASAL specimens only), is one component of a comprehensive MRSA colonization surveillance program. It is not intended to diagnose MRSA infection nor to guide or monitor treatment for MRSA infections. Performed at [facility], [place] [address], [place], [place] [address]   Culture, Urine     Status: Abnormal (Preliminary result)   Collection Time: 05/31/19  6:23 AM   Specimen: Urine, Random  Result Value Ref Range Status   Specimen Description URINE, RANDOM   Final   Special Requests   Final    NONE Performed at [facility], [place] [address] [place], [place] [address]    Culture 60,000 COLONIES/mL GRAM NEGATIVE RODS (A)  Final   Report Status PENDING  Incomplete      Studies: No results found.  Scheduled Meds: . apixaban  2.5 mg Oral BID  . digoxin  0.125 mg Oral Daily  . feeding supplement (ENSURE ENLIVE)  237 mL Oral TID BM  . metoprolol tartrate  75 mg Oral BID  . multivitamin with minerals  1 tablet Oral Daily  . pantoprazole  20 mg Oral Daily  . predniSONE  30 mg Oral BID WC  . sodium chloride flush  3 mL Intravenous Q12H  . terazosin  1 mg Oral QHS    Continuous Infusions: . sodium chloride    .  amiodarone 30 mg/hr (06/01/19 1100)  . cefTRIAXone (ROCEPHIN)  IV 1 g (05/31/19 1425)  . diltiazem (CARDIZEM) infusion 10 mg/hr (06/01/19 1100)     LOS: 3 days     Kayleen Memos, MD [facility] Pager [phone]  If 7PM-7AM, please contact night-coverage [email or web address] Password [facility] 06/01/2019, 12:11 PM

## 2019-06-01 NOTE — H&P (View-Only) (Signed)
Progress Note  Patient Name: Matthew Hickman Date of Encounter: 06/01/2019  Primary Cardiologist: Sherren Mocha, MD   Subjective   Remains in atrial flutter with rapid ventricular response despite treatment with intravenous amiodarone, oral metoprolol and digoxin.  Blood pressure borderline low. Thankfully he is pretty asymptomatic at rest.  Heart rate 100-110 at rest, increases to 140-150 with minimal activity.  Inpatient Medications    Scheduled Meds: . apixaban  2.5 mg Oral BID  . digoxin  0.125 mg Oral Daily  . feeding supplement (ENSURE ENLIVE)  237 mL Oral TID BM  . metoprolol tartrate  75 mg Oral BID  . multivitamin with minerals  1 tablet Oral Daily  . pantoprazole  20 mg Oral Daily  . predniSONE  30 mg Oral BID WC  . sodium chloride flush  3 mL Intravenous Q12H  . terazosin  1 mg Oral QHS   Continuous Infusions: . sodium chloride    . amiodarone 30 mg/hr (06/01/19 0848)  . cefTRIAXone (ROCEPHIN)  IV 1 g (05/31/19 1425)  . diltiazem (CARDIZEM) infusion 10 mg/hr (06/01/19 0800)   PRN Meds: sodium chloride, acetaminophen, ondansetron (ZOFRAN) IV, sodium chloride flush   Vital Signs    Vitals:   06/01/19 0355 06/01/19 0642 06/01/19 0730 06/01/19 0907  BP: (!) 114/56  114/62   Pulse: (!) 59  65 98  Resp: 20  20   Temp: 97.6 F (36.4 C)  (!) 97.5 F (36.4 C)   TempSrc: Oral  Other (Comment)   SpO2: 92%  95%   Weight:  53.9 kg    Height:        Intake/Output Summary (Last 24 hours) at 06/01/2019 0932 Last data filed at 06/01/2019 0800 Gross per 24 hour  Intake 2364.75 ml  Output 850 ml  Net 1514.75 ml   Last 3 Weights 06/01/2019 05/31/2019 05/30/2019  Weight (lbs) 118 lb 13.3 oz 129 lb 3 oz 124 lb 5.4 oz  Weight (kg) 53.9 kg 58.6 kg 56.4 kg      Telemetry    Atypical atrial flutter with RVR- Personally Reviewed  ECG    No new tracing- Personally Reviewed  Physical Exam  Appears comfortable lying with head of bed elevated at about 20 degrees GEN:  No acute distress.   Neck: No JVD Cardiac:  Rapid irregular rhythm, no murmurs, rubs, or gallops.  Respiratory: Clear to auscultation bilaterally. GI: Soft, nontender, non-distended  MS: No edema; No deformity. Neuro:  Nonfocal  Psych: Normal affect   Labs    High Sensitivity Troponin:  No results for input(s): TROPONINIHS in the last 720 hours.    Chemistry Recent Labs  Lab 05/30/19 0341 05/31/19 0235 06/01/19 0248  NA 140 138 137  K 4.2 4.3 4.3  CL 102 100 101  CO2 27 28 27   GLUCOSE 166* 174* 201*  BUN 34* 39* 40*  CREATININE 0.84 0.84 0.77  CALCIUM 8.6* 8.6* 8.7*  GFRNONAA >60 >60 >60  GFRAA >60 >60 >60  ANIONGAP 11 10 9      Hematology Recent Labs  Lab 05/30/19 0341 05/31/19 0810 06/01/19 0248  WBC 9.0 11.9* 9.6  RBC 2.77* 3.16* 2.79*  HGB 9.1* 10.4* 9.2*  HCT 28.2* 32.1* 27.7*  MCV 101.8* 101.6* 99.3  MCH 32.9 32.9 33.0  MCHC 32.3 32.4 33.2  RDW 20.4* 20.3* 20.1*  PLT 65* 83* 71*    BNPNo results for input(s): BNP, PROBNP in the last 168 hours.   DDimer No results for input(s): DDIMER  in the last 168 hours.   Radiology    No results found.  Cardiac Studies   Echo 04/05/19  1. Left ventricular ejection fraction, by estimation, is 55 to 60%. The  left ventricle has normal function. The left ventricle has no regional  wall motion abnormalities. Left ventricular diastolic parameters are  consistent with Grade I diastolic  dysfunction (impaired relaxation).  2. Right ventricular systolic function is normal. The right ventricular  size is normal. There is moderately elevated pulmonary artery systolic  pressure. The estimated right ventricular systolic pressure is [id number] mmHg.  3. The mitral valve is normal in structure and function. Trivial mitral  valve regurgitation. No evidence of mitral stenosis.  4. The aortic valve is tricuspid. Aortic valve regurgitation is not  visualized. No aortic stenosis is present.  5. Aortic dilatation noted. There  is mild dilatation of the ascending  aorta measuring 40 mm.  6. The inferior vena cava is dilated in size with <50% respiratory  variability, suggesting right atrial pressure of 15 mmHg.   Patient Profile     86 y.o. male with hypertension giant cell arteritis with visual involvement, presenting with atypical atrial flutter with rapid ventricular response, difficult to rate control, moderate thrombocytopenia without bleeding complications  Assessment & Plan    Despite treatment with amiodarone and 2 other rate control medications, remains quite tachycardic.  Increase metoprolol to 75 mg twice daily today.  Keep on IV amiodarone 1 more day.  Tentatively scheduled for TEE-cardioversion tomorrow at 0830 hrs. This procedure has been fully reviewed with the patient and written informed consent has been obtained. Hemoglobin and platelet count is stable.  Macrocytosis with normal vitamin B12 level suggests possible myelodysplasia.  Consider hematology consult.      For questions or updates, please contact [place] Please consult [email or web address] for contact info under        Signed, Sanda Klein, MD  06/01/2019, 9:32 AM

## 2019-06-02 ENCOUNTER — Inpatient Hospital Stay (HOSPITAL_COMMUNITY): Payer: Medicare Other

## 2019-06-02 ENCOUNTER — Encounter (HOSPITAL_COMMUNITY)
Admission: EM | Disposition: A | Discharge status: Skilled Nursing Facility | Payer: Self-pay | Source: Home / Self Care | Attending: Internal Medicine

## 2019-06-02 ENCOUNTER — Inpatient Hospital Stay (HOSPITAL_COMMUNITY): Payer: Medicare Other | Admitting: Anesthesiology

## 2019-06-02 ENCOUNTER — Encounter (HOSPITAL_COMMUNITY): Payer: Self-pay | Admitting: Internal Medicine

## 2019-06-02 DIAGNOSIS — I4891 Unspecified atrial fibrillation: Secondary | ICD-10-CM

## 2019-06-02 DIAGNOSIS — D539 Nutritional anemia, unspecified: Secondary | ICD-10-CM | POA: Diagnosis not present

## 2019-06-02 DIAGNOSIS — I340 Nonrheumatic mitral (valve) insufficiency: Secondary | ICD-10-CM | POA: Diagnosis not present

## 2019-06-02 DIAGNOSIS — I484 Atypical atrial flutter: Secondary | ICD-10-CM | POA: Diagnosis not present

## 2019-06-02 DIAGNOSIS — M316 Other giant cell arteritis: Secondary | ICD-10-CM | POA: Diagnosis not present

## 2019-06-02 HISTORY — PX: CARDIOVERSION: SHX1299

## 2019-06-02 HISTORY — PX: TEE WITHOUT CARDIOVERSION: SHX5443

## 2019-06-02 LAB — URINE CULTURE: Culture: 60000 — AB

## 2019-06-02 LAB — BASIC METABOLIC PANEL
Anion gap: 8 (ref 5–15)
BUN: 40 mg/dL — ABNORMAL HIGH (ref 8–23)
CO2: 28 mmol/L (ref 22–32)
Calcium: 8.5 mg/dL — ABNORMAL LOW (ref 8.9–10.3)
Chloride: 101 mmol/L (ref 98–111)
Creatinine, Ser: 0.86 mg/dL (ref 0.61–1.24)
GFR calc Af Amer: >60 mL/min (ref >60)
GFR calc non Af Amer: >60 mL/min (ref >60)
Glucose, Bld: 184 mg/dL — ABNORMAL HIGH (ref 70–99)
Potassium: 4.2 mmol/L (ref 3.5–5.1)
Sodium: 137 mmol/L (ref 135–145)

## 2019-06-02 LAB — CBC
HCT: 28 % — ABNORMAL LOW (ref 39.0–52.0)
Hemoglobin: 9.6 g/dL — ABNORMAL LOW (ref 13.0–17.0)
MCH: 34.3 pg — ABNORMAL HIGH (ref 26.0–34.0)
MCHC: 34.3 g/dL (ref 30.0–36.0)
MCV: 100 fL (ref 80.0–100.0)
Platelets: 73 10*3/uL — ABNORMAL LOW (ref 150–400)
RBC: 2.8 MIL/uL — ABNORMAL LOW (ref 4.22–5.81)
RDW: 19.9 % — ABNORMAL HIGH (ref 11.5–15.5)
WBC: 7.6 10*3/uL (ref 4.0–10.5)
nRBC: 0 % (ref 0.0–0.2)

## 2019-06-02 SURGERY — ECHOCARDIOGRAM, TRANSESOPHAGEAL
Anesthesia: General

## 2019-06-02 MED ORDER — AMIODARONE HCL 200 MG PO TABS
400.0000 mg | ORAL_TABLET | Freq: Every day | ORAL | Status: DC
  Administered 2019-06-02 – 2019-06-03 (×2): 400 mg via ORAL
  Filled 2019-06-02 (×2): qty 2

## 2019-06-02 MED ORDER — PROPOFOL 500 MG/50ML IV EMUL
INTRAVENOUS | Status: DC | PRN
  Administered 2019-06-02: 75 ug/kg/min via INTRAVENOUS

## 2019-06-02 MED ORDER — SODIUM CHLORIDE 0.9 % IV SOLN
INTRAVENOUS | Status: AC | PRN
  Administered 2019-06-02: 500 mL via INTRAMUSCULAR

## 2019-06-02 MED ORDER — SODIUM CHLORIDE 0.9 % IV SOLN: INTRAVENOUS | Status: DC

## 2019-06-02 MED ORDER — VANCOMYCIN HCL 1250 MG/250ML IV SOLN
1250.0000 mg | Freq: Once | INTRAVENOUS | Status: AC
  Administered 2019-06-02: 1250 mg via INTRAVENOUS
  Filled 2019-06-02: qty 250

## 2019-06-02 MED ORDER — AMIODARONE HCL 200 MG PO TABS: 200.0000 mg | ORAL_TABLET | Freq: Every day | ORAL | Status: DC

## 2019-06-02 MED ORDER — SODIUM CHLORIDE 0.9 % IV SOLN: INTRAVENOUS | Status: DC | PRN

## 2019-06-02 MED ORDER — PROPOFOL 10 MG/ML IV BOLUS
INTRAVENOUS | Status: DC | PRN
  Administered 2019-06-02 (×2): 10 mg via INTRAVENOUS
  Administered 2019-06-02: 20 mg via INTRAVENOUS
  Administered 2019-06-02: 10 mg via INTRAVENOUS

## 2019-06-02 MED ORDER — VANCOMYCIN HCL 750 MG/150ML IV SOLN
750.0000 mg | INTRAVENOUS | Status: DC
  Administered 2019-06-03 – 2019-06-04 (×2): 750 mg via INTRAVENOUS
  Filled 2019-06-02 (×2): qty 150

## 2019-06-02 NOTE — Progress Notes (Signed)
PT Cancellation Note  Patient Details Name: Matthew Hickman MRN: [id number] DOB: [birth year]   Cancelled Treatment:    Reason Eval/Treat Not Completed: Patient at procedure or test/unavailable   Matthew Hickman 06/02/2019, 7:34 AM Bayard Males, PT Acute Rehabilitation Services Pager: [phone] Office: [phone]

## 2019-06-02 NOTE — Progress Notes (Signed)
Pharmacy Antibiotic Note  Matthew Hickman is a 86 y.o. male admitted on 05/29/2019.  Pharmacy consulted to start vancomycin for cellulitis in left forearm. Plan to continue IV antibiotics for 2-3 days then switch to po. If longer duration of IV antibiotics are changed will need to consider switching to zyvox d/t shortage of vancomycin level reagent.   Patient currently afebrile, wbc 7.6, scr 0.86. Patient also with pan sensitive pseudomonal infection being treated with cipro.   Vancomycin 750 mg IV Q 24  hrs. Goal AUC 400-550. Expected AUC: 451 SCr used: 0.86   Plan: Vancomycin 1250mg  x1 then 750mg  q24 hours  Height: 5\' 8"  (172.7 cm) Weight: 56.5 kg (124 lb 9 oz) IBW/kg (Calculated) : 68.4  Temp (24hrs), Avg:97.6 F (36.4 C), Min:97.4 F (36.3 C), Max:98 F (36.7 C)  Recent Labs  Lab 05/29/19 0959 05/30/19 0341 05/31/19 0235 05/31/19 0810 06/01/19 0248 06/02/19 0210  WBC 10.2 9.0  --  11.9* 9.6 7.6  CREATININE 0.95 0.84 0.84  --  0.77 0.86    Estimated Creatinine Clearance: 44.7 mL/min (by C-G formula based on SCr of 0.86 mg/dL).    Allergies  Allergen Reactions  . Tape Other (See Comments)    PATIENT'S SKIN IS THIN AND IT TEARS EASILY   Erin Hearing PharmD., BCPS Clinical Pharmacist 06/02/2019 11:49 AM

## 2019-06-02 NOTE — Interval H&P Note (Signed)
History and Physical Interval Note:  06/02/2019 7:39 AM  Matthew Hickman  has presented today for surgery, with the diagnosis of A flutter.  The various methods of treatment have been discussed with the patient and family. After consideration of risks, benefits and other options for treatment, the patient has consented to  Procedure(s): TRANSESOPHAGEAL ECHOCARDIOGRAM (TEE) (N/A) CARDIOVERSION (N/A) as a surgical intervention.  The patient's history has been reviewed, patient examined, no change in status, stable for surgery.  I have reviewed the patient's chart and labs.  Questions were answered to the patient's satisfaction.     Donato Heinz

## 2019-06-02 NOTE — Progress Notes (Signed)
Back from endoscopy by wheelchair awake and alert. 

## 2019-06-02 NOTE — Discharge Instructions (Addendum)
Information on my medicine - ELIQUIS (apixaban)  Why was Eliquis prescribed for you? Eliquis was prescribed for you to reduce the risk of a blood clot forming that can cause a stroke if you have a medical condition called atrial fibrillation (a type of irregular heartbeat).  What do You need to know about Eliquis ? Take your Eliquis TWICE DAILY - one tablet in the morning and one tablet in the evening with or without food. If you have difficulty swallowing the tablet whole please discuss with your pharmacist how to take the medication safely.  Take Eliquis exactly as prescribed by your doctor and DO NOT stop taking Eliquis without talking to the doctor who prescribed the medication.  Stopping may increase your risk of developing a stroke.  Refill your prescription before you run out.  After discharge, you should have regular check-up appointments with your healthcare provider that is prescribing your Eliquis.  In the future your dose may need to be changed if your kidney function or weight changes by a significant amount or as you get older.  What do you do if you miss a dose? If you miss a dose, take it as soon as you remember on the same day and resume taking twice daily.  Do not take more than one dose of ELIQUIS at the same time to make up a missed dose.  Important Safety Information A possible side effect of Eliquis is bleeding. You should call your healthcare provider right away if you experience any of the following: ? Bleeding from an injury or your nose that does not stop. ? Unusual colored urine (red or dark brown) or unusual colored stools (red or black). ? Unusual bruising for unknown reasons. ? A serious fall or if you hit your head (even if there is no bleeding).  Some medicines may interact with Eliquis and might increase your risk of bleeding or clotting while on Eliquis. To help avoid this, consult your healthcare provider or pharmacist prior to using any new  prescription or non-prescription medications, including herbals, vitamins, non-steroidal anti-inflammatory drugs (NSAIDs) and supplements.  This website has more information on Eliquis (apixaban): http://www.eliquis.com/eliquis/home      Atrial Flutter  Atrial flutter is a type of abnormal heart rhythm (arrhythmia). The heart has an electrical system that tells it how to beat. In atrial flutter, the signals move rapidly in the top chambers of the heart (the atria). This makes your heart beat very fast. Atrial flutter can come and go, or it can be permanent. The goal of treatment is to prevent blood clots from forming, control your heart rate, or restore your heartbeat to a normal rhythm. If this condition is not treated, it can cause serious problems, such as a weakened heart muscle (cardiomyopathy) or a stroke. What are the causes? This condition is often caused by conditions that damage the heart's electrical system. These include:  Heart conditions and heart surgery. These include heart attacks and open-heart surgery.  Lung problems, such as COPD or a blood clot in the lung (pulmonary embolism, or PE).  Poorly controlled high blood pressure (hypertension).  Overactive thyroid (hyperthyroidism).  Diabetes. In some cases, the cause of this condition is not known. What increases the risk? You are more likely to develop this condition if:  You are an elderly adult.  You are a man.  You are overweight (obese).  You have obstructive sleep apnea.  You have a family history of atrial flutter.  You have diabetes.  You  drink a lot of alcohol, especially binge drinking.  You use drugs, including cannabis.  You smoke. What are the signs or symptoms? Symptoms of this condition include:  A feeling that your heart is pounding or racing (palpitations).  Shortness of breath.  Chest pain.  Feeling dizzy or light-headed.  Fainting.  Low blood pressure  (hypotension).  Fatigue.  Tiring easily during exercise or activity. In some cases, there are no symptoms. How is this diagnosed? This condition may be diagnosed with:  An electrocardiogram (ECG) to check electrical signals of the heart.  An ambulatory cardiac monitor to record your heart's activity for a few days.  An echocardiogram to create pictures of your heart.  A transesophageal echocardiogram (TEE) to create even better pictures of your heart.  A stress test to check your blood supply while you exercise.  Imaging tests, such as a CT scan or chest X-ray.  Blood tests. How is this treated? Treatment depends on underlying conditions and how you feel when you experience atrial flutter. This condition may be treated with:  Medicines to prevent blood clots or to treat heart rate or heart rhythm problems.  Electrical cardioversion to reset the heart's rhythm.  Ablation to remove the heart tissue that sends abnormal signals.  Left atrial appendage closure to seal the area where blood clots can form. In some cases, underlying conditions will be treated. Follow these instructions at home: Medicines  Take over-the-counter and prescription medicines only as told by your health care provider.  Do not take any new medicines without talking to your health care provider.  If you are taking blood thinners: ? Talk with your health care provider before you take any medicines that contain aspirin or NSAIDs, such as ibuprofen. These medicines increase your risk for dangerous bleeding. ? Take your medicine exactly as told, at the same time every day. ? Avoid activities that could cause injury or bruising, and follow instructions about how to prevent falls. ? Wear a medical alert bracelet or carry a card that lists what medicines you take. Lifestyle  Eat heart-healthy foods. Talk with a dietitian to make an eating plan that is right for you.  Do not use any products that contain  nicotine or tobacco, such as cigarettes, e-cigarettes, and chewing tobacco. If you need help quitting, ask your health care provider.  Do not drink alcohol.  Do not use drugs, including cannabis.  Lose weight if you are overweight or obese.  Exercise regularly as instructed by your health care provider. General instructions  Do not use diet pills unless your health care provider approves. Diet pills may make heart problems worse.  If you have obstructive sleep apnea, manage your condition as told by your health care provider.  Keep all follow-up visits as told by your health care provider. This is important. Contact a health care provider if you:  Notice a change in the rate, rhythm, or strength of your heartbeat.  Are taking a blood thinner and you notice more bruising.  Have a sudden change in weight.  Tire more easily when you exercise or do heavy work. Get help right away if you have:  Pain or pressure in your chest.  Shortness of breath.  Fainting.  Increasing sweating with no known cause.  Side effects of blood thinners, such as blood in your vomit, stool, or urine, or bleeding that cannot stop.  Any symptoms of a stroke. "BE FAST" is an easy way to remember the main warning signs of  a stroke: ? B - Balance. Signs are dizziness, sudden trouble walking, or loss of balance. ? E - Eyes. Signs are trouble seeing or a sudden change in vision. ? F - Face. Signs are sudden weakness or numbness of the face, or the face or eyelid drooping on one side. ? A - Arms. Signs are weakness or numbness in an arm. This happens suddenly and usually on one side of the body. ? S - Speech. Signs are sudden trouble speaking, slurred speech, or trouble understanding what people say. ? T - Time. Time to call emergency services. Write down what time symptoms started.  Other signs of a stroke, such as: ? A sudden, severe headache with no known cause. ? Nausea or  vomiting. ? Seizure.  These symptoms may represent a serious problem that is an emergency. Do not wait to see if the symptoms will go away. Get medical help right away. Call your local emergency services (911 in the [place] Do not drive yourself to the hospital. Summary  Atrial flutter is an abnormal heart rhythm that can give you symptoms of palpitations, shortness of breath, or fatigue.  Atrial flutter is often treated with medicines to keep your heart in a normal rhythm and to prevent a stroke.  Get help right away if you cannot catch your breath, or have chest pain or pressure.  Get help right away if you have signs or symptoms of a stroke. This information is not intended to replace advice given to you by your health care provider. Make sure you discuss any questions you have with your health care provider. Document Revised: 07/13/2018 Document Reviewed: 07/13/2018 Elsevier Patient Education  [place].

## 2019-06-02 NOTE — Progress Notes (Signed)
PROGRESS NOTE  PANKAJ HOUCHEN [id number] DOB: 12/07/1925 DOA: 05/29/2019 PCP: [name], MD  HPI/Recap of past 24 hours: Matthew Hickman is a 86 y.o. male with medical history significant of hypertension, paroxysmal A. fib, newly diagnosed giant cell arteritis with bilateral vision impairment on steroid, hard hearing, presented with fall.  Last month, he was diagnosed with giant cell arteritis, and has been on high-dose of steroid through the month, poor eyesight.  Last month on same admission he had episode of A. Fib?, but eventually the diagnosis of A. fib was not established, and sent home with p.o. Cardizem and metoprolol.  ED Course: CT head shows chronic changes, and patient was found in rapid A. fib.  Cardiology consulted and [facility] asked to admit.  Currently on amiodarone drip, diltiazem drip and oral digoxin.  Has completed 3 doses of 0.25 mg every 2 hours on 05/31/2019.  Metoprolol dose increased to 75 mg twice daily.  TEE cardioversion completed 06/02/19.  06/02/19: Seen and examined.  Reports left forearm tenderness on palpation, also noted to be erythematous and warm, some purulence present.  Will obtain an x-ray and treat empirically for purulent cellulitis.  Due to chronic steroid use with immunosuppression and purulence, will treat with IV antibiotics vancomycin to cover MRSA.   Assessment/Plan: Active Problems:   A-fib (HCC)  A. flutter with RVR post TEE guided cardioversion on 06/02/19 by Dr. Gilman Schmidt Rate fluctuates between the 40s and 105, noted to be in sinus rhythm with sinus arrhythmia. Currently on diltiazem.,  Amiodarone switched to oral Also on Lopressor 75 mg twice daily Continue to closely monitor on telemetry Continue Eliquis for CVA prevention Last 2D echo on 04/05/19> normal LVEF 55 to 60% with grade 1 diastolic dysfunction.  Left forearm purulent cellulitis Will obtain an x-ray of the left forearm. We will treat empirically with IV vancomycin due to chronic use  of steroids with immunosuppression and purulence IV vancomycin day #1; will treat with IV antibiotics for 2 to 3 days, once improved we will switch to oral antibiotics for total of 10-14 days No evidence of systemic infection at this time.  Continue to monitor.  Resolved hypotensive, likely iatrogenic Blood pressure is at goal.  Pseudomonas aeruginosa UTI Presented with pyuria Urine culture growing 60,000 colonies of Pseudomonas aeruginosa, pansensitive.  Received 3 days of Rocephin. Switch to Cipro x5 days, day number 2 out of 5.  Acute thrombocytopenia in the setting of acute illness Platelets 83K>> 71K>> 73K Will need to follow-up with hematology outpatient  Giant cell arteritis Continue prednisone home regimen Continue PPI for GI prophylaxis  Moderate pulmonary hypertension Euvolemic on exam Management per cardiology's recommendation  Macrocytic anemia with acute blood loss Baseline hemoglobin 11 Hemoglobin 9.1> 10.4>> 9.2>> 9.6 MCV of 101>> 99.2  BPH Continue Terazosin.   Monitor urine output No evidence of acute renal failure.  Physical debility PT OT assessment recommended SNF TOC consulted to assist with SNF placement. Continue PT OT with assistance and fall precautions.  DVT prophylaxis:  Eliquis Code Status: Full code Family Communication:  Will call family if okay with the patient.  Disposition Plan:  Patient is from home.  Anticipate discharge to home in the next 48 hours or when cardiology signs off.  Barrier to discharge: Ongoing management for A. fib/A-flutter, and left upper extremity cellulitis.    Consults called: cardiology       Objective: Vitals:   06/02/19 0835 06/02/19 0841 06/02/19 0900 06/02/19 0933  BP: (!) 105/39 113/76  Pulse: 91 94  (!) 101  Resp: 20 17    Temp:   (!) 97.4 F (36.3 C)   TempSrc:   Oral   SpO2: 100% 93%    Weight:      Height:        Intake/Output Summary (Last 24 hours) at 06/02/2019 0953 Last data  filed at 06/02/2019 [id number] Gross per 24 hour  Intake 1531.83 ml  Output 825 ml  Net 706.83 ml   Filed Weights   05/31/19 0424 06/01/19 0642 06/02/19 0418  Weight: 58.6 kg 53.9 kg 56.5 kg    Exam:  . General: 86 y.o. year-old male well-developed well-nourished no acute distress.  Alert and interactive.  Very hard of hearing.   . Cardiovascular: Regular rate and rhythm no rubs or gallops. Marland Kitchen Respiratory: Clear to auscultation no wheezes no rales. . Abdomen: Soft nontender nondistended normal bowel sounds present.   . Musculoskeletal: Left upper extremity, forearm with warmth, tenderness with palpation, purulence and swelling noted.   Marland Kitchen Psychiatry: Mood is appropriate for condition and setting.  Data Reviewed: CBC: Recent Labs  Lab 05/29/19 0959 05/30/19 0341 05/31/19 0810 06/01/19 0248 06/02/19 0210  WBC 10.2 9.0 11.9* 9.6 7.6  HGB 10.2* 9.1* 10.4* 9.2* 9.6*  HCT 31.1* 28.2* 32.1* 27.7* 28.0*  MCV 103.7* 101.8* 101.6* 99.3 100.0  PLT 68* 65* 83* 71* 73*   Basic Metabolic Panel: Recent Labs  Lab 05/29/19 0959 05/30/19 0341 05/31/19 0235 06/01/19 0248 06/02/19 0210  NA 142 140 138 137 137  K 3.9 4.2 4.3 4.3 4.2  CL 105 102 100 101 101  CO2 25 27 28 27 28   GLUCOSE 96 166* 174* 201* 184*  BUN 33* 34* 39* 40* 40*  CREATININE 0.95 0.84 0.84 0.77 0.86  CALCIUM 8.5* 8.6* 8.6* 8.7* 8.5*  MG  --  1.9  --   --   --    GFR: Estimated Creatinine Clearance: 44.7 mL/min (by C-G formula based on SCr of 0.86 mg/dL). Liver Function Tests: No results for input(s): AST, ALT, ALKPHOS, BILITOT, PROT, ALBUMIN in the last 168 hours. No results for input(s): LIPASE, AMYLASE in the last 168 hours. No results for input(s): AMMONIA in the last 168 hours. Coagulation Profile: No results for input(s): INR, PROTIME in the last 168 hours. Cardiac Enzymes: No results for input(s): CKTOTAL, CKMB, CKMBINDEX, TROPONINI in the last 168 hours. BNP (last 3 results) No results for input(s): PROBNP  in the last 8760 hours. HbA1C: No results for input(s): HGBA1C in the last 72 hours. CBG: Recent Labs  Lab 05/29/19 1918  GLUCAP 218*   Lipid Profile: No results for input(s): CHOL, HDL, LDLCALC, TRIG, CHOLHDL, LDLDIRECT in the last 72 hours. Thyroid Function Tests: No results for input(s): TSH, T4TOTAL, FREET4, T3FREE, THYROIDAB in the last 72 hours. Anemia Panel: No results for input(s): VITAMINB12, FOLATE, FERRITIN, TIBC, IRON, RETICCTPCT in the last 72 hours. Urine analysis:    Component Value Date/Time   COLORURINE YELLOW 05/29/2019 1211   APPEARANCEUR HAZY (A) 05/29/2019 1211   LABSPEC 1.015 05/29/2019 1211   PHURINE 7.0 05/29/2019 1211   GLUCOSEU NEGATIVE 05/29/2019 1211   HGBUR SMALL (A) 05/29/2019 1211   BILIRUBINUR NEGATIVE 05/29/2019 1211   KETONESUR NEGATIVE 05/29/2019 1211   PROTEINUR 30 (A) 05/29/2019 1211   UROBILINOGEN 1.0 09/20/2010 1226   NITRITE NEGATIVE 05/29/2019 1211   LEUKOCYTESUR LARGE (A) 05/29/2019 1211   Sepsis Labs: @LABRCNTIP (procalcitonin:4,lacticidven:4)  ) Recent Results (from the past 240 hour(s))  Respiratory Panel  by RT PCR (Flu A&B, Covid) - Nasopharyngeal Swab     Status: None   Collection Time: 05/29/19  1:58 PM   Specimen: Nasopharyngeal Swab  Result Value Ref Range Status   SARS Coronavirus 2 by RT PCR NEGATIVE NEGATIVE Final    Comment: (NOTE) SARS-CoV-2 target nucleic acids are NOT DETECTED. The SARS-CoV-2 RNA is generally detectable in upper respiratoy specimens during the acute phase of infection. The lowest concentration of SARS-CoV-2 viral copies this assay can detect is 131 copies/mL. A negative result does not preclude SARS-Cov-2 infection and should not be used as the sole basis for treatment or other patient management decisions. A negative result may occur with  improper specimen collection/handling, submission of specimen other than nasopharyngeal swab, presence of viral mutation(s) within the areas targeted by  this assay, and inadequate number of viral copies (<131 copies/mL). A negative result must be combined with clinical observations, patient history, and epidemiological information. The expected result is Negative. Fact Sheet for Patients:  PinkCheek.be Fact Sheet for Healthcare Providers:  GravelBags.it This test is not yet ap proved or cleared by the [place] FDA and  has been authorized for detection and/or diagnosis of SARS-CoV-2 by FDA under an Emergency Use Authorization (EUA). This EUA will remain  in effect (meaning this test can be used) for the duration of the COVID-19 declaration under Section 564(b)(1) of the Act, 21 U.S.C. section 360bbb-3(b)(1), unless the authorization is terminated or revoked sooner.    Influenza A by PCR NEGATIVE NEGATIVE Final   Influenza B by PCR NEGATIVE NEGATIVE Final    Comment: (NOTE) The Xpert Xpress SARS-CoV-2/FLU/RSV assay is intended as an aid in  the diagnosis of influenza from Nasopharyngeal swab specimens and  should not be used as a sole basis for treatment. Nasal washings and  aspirates are unacceptable for Xpert Xpress SARS-CoV-2/FLU/RSV  testing. Fact Sheet for Patients: PinkCheek.be Fact Sheet for Healthcare Providers: GravelBags.it This test is not yet approved or cleared by the [place] FDA and  has been authorized for detection and/or diagnosis of SARS-CoV-2 by  FDA under an Emergency Use Authorization (EUA). This EUA will remain  in effect (meaning this test can be used) for the duration of the  Covid-19 declaration under Section 564(b)(1) of the Act, 21  U.S.C. section 360bbb-3(b)(1), unless the authorization is  terminated or revoked. Performed at [facility], [place] [address], [place], [place] [address]   MRSA PCR Screening     Status: None   Collection Time: 05/30/19  5:43 AM   Specimen:  Nasal Mucosa; Nasopharyngeal  Result Value Ref Range Status   MRSA by PCR NEGATIVE NEGATIVE Final    Comment:        The GeneXpert MRSA Assay (FDA approved for NASAL specimens only), is one component of a comprehensive MRSA colonization surveillance program. It is not intended to diagnose MRSA infection nor to guide or monitor treatment for MRSA infections. Performed at [facility], [place] [address], [place], [place] [address]   Culture, Urine     Status: Abnormal   Collection Time: 05/31/19  6:23 AM   Specimen: Urine, Random  Result Value Ref Range Status   Specimen Description URINE, RANDOM  Final   Special Requests   Final    NONE Performed at [facility], [place] [address] [place], [place] [address]    Culture 60,000 COLONIES/mL PSEUDOMONAS AERUGINOSA (A)  Final   Report Status 06/02/2019 FINAL  Final  Organism ID, Bacteria PSEUDOMONAS AERUGINOSA (A)  Final      Susceptibility   Pseudomonas aeruginosa - MIC*    CEFTAZIDIME 2 SENSITIVE Sensitive     CIPROFLOXACIN <=0.25 SENSITIVE Sensitive     GENTAMICIN 2 SENSITIVE Sensitive     IMIPENEM 2 SENSITIVE Sensitive     PIP/TAZO <=4 SENSITIVE Sensitive     CEFEPIME 4 SENSITIVE Sensitive     * 60,000 COLONIES/mL PSEUDOMONAS AERUGINOSA      Studies: No results found.  Scheduled Meds: . apixaban  2.5 mg Oral BID  . ciprofloxacin  500 mg Oral BID  . digoxin  0.125 mg Oral Daily  . feeding supplement (ENSURE ENLIVE)  237 mL Oral TID BM  . metoprolol tartrate  75 mg Oral BID  . multivitamin with minerals  1 tablet Oral Daily  . pantoprazole  20 mg Oral Daily  . predniSONE  30 mg Oral BID WC  . sodium chloride flush  3 mL Intravenous Q12H  . terazosin  1 mg Oral QHS    Continuous Infusions: . sodium chloride    . amiodarone 30 mg/hr (06/02/19 0939)  . diltiazem (CARDIZEM) infusion Stopped (06/01/19 2345)     LOS: 4 days     Kayleen Memos, MD [facility] Pager [phone]  If 7PM-7AM,  please contact night-coverage [email or web address] Password [facility] 06/02/2019, 9:53 AM

## 2019-06-02 NOTE — Progress Notes (Signed)
Patient noted to have red swollen area on left forearm on arrival to endoscopy recovery. Gauze dressing in place. Dressing removed. Healing puncture area noted under dressing. Area noted to be 6cm length and 5.5cm width. Area is red, warm to touch and indurated. 1cm x1cm area in center is open with yellow drainage. Primary RN notified of this finding during report. She reports known skin tear on left forearm. Advised this need to be assessed by primary team. Text page sent to rounding MD to notify.

## 2019-06-02 NOTE — Anesthesia Procedure Notes (Signed)
Procedure Name: MAC Date/Time: 06/02/2019 7:35 AM Performed by: Harden Mo, CRNA Pre-anesthesia Checklist: Patient identified, Emergency Drugs available, Suction available and Patient being monitored Patient Re-evaluated:Patient Re-evaluated prior to induction Oxygen Delivery Method: Nasal cannula Preoxygenation: Pre-oxygenation with 100% oxygen Induction Type: IV induction Placement Confirmation: positive ETCO2 and breath sounds checked- equal and bilateral Dental Injury: Teeth and Oropharynx as per pre-operative assessment

## 2019-06-02 NOTE — Progress Notes (Signed)
Pt is in the procedure room during shift change.

## 2019-06-02 NOTE — Progress Notes (Signed)
Progress Note  Patient Name: Matthew Hickman Date of Encounter: 06/02/2019  Primary Cardiologist: Sherren Mocha, MD   Subjective   Seen shortly after cardioversion.  Now in sinus rhythm with very frequent PACs.  Still little groggy, but easily awoken and conversant.  Inpatient Medications    Scheduled Meds: . apixaban  2.5 mg Oral BID  . ciprofloxacin  500 mg Oral BID  . digoxin  0.125 mg Oral Daily  . feeding supplement (ENSURE ENLIVE)  237 mL Oral TID BM  . metoprolol tartrate  75 mg Oral BID  . multivitamin with minerals  1 tablet Oral Daily  . pantoprazole  20 mg Oral Daily  . predniSONE  30 mg Oral BID WC  . sodium chloride flush  3 mL Intravenous Q12H  . terazosin  1 mg Oral QHS   Continuous Infusions: . sodium chloride    . amiodarone 30 mg/hr (06/02/19 0939)  . diltiazem (CARDIZEM) infusion Stopped (06/01/19 2345)   PRN Meds: sodium chloride, acetaminophen, ondansetron (ZOFRAN) IV, sodium chloride flush   Vital Signs    Vitals:   06/02/19 0835 06/02/19 0841 06/02/19 0900 06/02/19 0933  BP: (!) 105/39 113/76    Pulse: 91 94  (!) 101  Resp: 20 17    Temp:   (!) 97.4 F (36.3 C)   TempSrc:   Oral   SpO2: 100% 93%    Weight:      Height:        Intake/Output Summary (Last 24 hours) at 06/02/2019 0948 Last data filed at 06/02/2019 0811 Gross per 24 hour  Intake 1531.83 ml  Output 825 ml  Net 706.83 ml   Last 3 Weights 06/02/2019 06/01/2019 05/31/2019  Weight (lbs) 124 lb 9 oz 118 lb 13.3 oz 129 lb 3 oz  Weight (kg) 56.5 kg 53.9 kg 58.6 kg      Telemetry    Atypical atrial flutter with RVR overnight, now in sinus rhythm with very frequent PACs after cardioversion- Personally Reviewed  ECG    Sinus rhythm with very frequent PACs, unusual "spiky" P wave morphology, nonspecific T wave flattening - Personally Reviewed  Physical Exam  Sleepy, but easy to awake, very lean GEN: No acute distress.   Neck: No JVD Cardiac: RRR baseline with very frequent  ectopy, no murmurs, rubs, or gallops.  Respiratory: Clear to auscultation bilaterally. GI: Soft, nontender, non-distended  MS: No edema; No deformity. Neuro:  Nonfocal  Psych: Normal affect   Labs    High Sensitivity Troponin:  No results for input(s): TROPONINIHS in the last 720 hours.    Chemistry Recent Labs  Lab 05/31/19 0235 06/01/19 0248 06/02/19 0210  NA 138 137 137  K 4.3 4.3 4.2  CL 100 101 101  CO2 28 27 28   GLUCOSE 174* 201* 184*  BUN 39* 40* 40*  CREATININE 0.84 0.77 0.86  CALCIUM 8.6* 8.7* 8.5*  GFRNONAA >60 >60 >60  GFRAA >60 >60 >60  ANIONGAP 10 9 8      Hematology Recent Labs  Lab 05/31/19 0810 06/01/19 0248 06/02/19 0210  WBC 11.9* 9.6 7.6  RBC 3.16* 2.79* 2.80*  HGB 10.4* 9.2* 9.6*  HCT 32.1* 27.7* 28.0*  MCV 101.6* 99.3 100.0  MCH 32.9 33.0 34.3*  MCHC 32.4 33.2 34.3  RDW 20.3* 20.1* 19.9*  PLT 83* 71* 73*    BNPNo results for input(s): BNP, PROBNP in the last 168 hours.   DDimer No results for input(s): DDIMER in the last 168 hours.  Radiology    No results found.  Cardiac Studies   Echo 04/05/19  1. Left ventricular ejection fraction, by estimation, is 55 to 60%. The  left ventricle has normal function. The left ventricle has no regional  wall motion abnormalities. Left ventricular diastolic parameters are  consistent with Grade I diastolic  dysfunction (impaired relaxation).  2. Right ventricular systolic function is normal. The right ventricular  size is normal. There is moderately elevated pulmonary artery systolic  pressure. The estimated right ventricular systolic pressure is [id number] mmHg.  3. The mitral valve is normal in structure and function. Trivial mitral  valve regurgitation. No evidence of mitral stenosis.  4. The aortic valve is tricuspid. Aortic valve regurgitation is not  visualized. No aortic stenosis is present.  5. Aortic dilatation noted. There is mild dilatation of the ascending  aorta measuring 40 mm.    6. The inferior vena cava is dilated in size with <50% respiratory  variability, suggesting right atrial pressure of 15 mmHg.   Patient Profile     86 y.o. male with hypertension, giant cell arteritis with visual involvement, presenting with atypical atrial flutter with rapid ventricular response, difficult to rate control, moderate thrombocytopenia without bleeding complications  Assessment & Plan    1.  Atypical atrial flutter: Refractory to combination rate control medications, now after successful cardioversion but with very frequent ectopy and high risk of recurrence.  We will keep on amiodarone (will switch to oral amiodarone) and beta-blockers, but will discontinue the digoxin.  He is currently on anticoagulation and should continue this for a minimum of 30 days, but I am uncertain about the safety of long-term anticoagulants due to his gait problems and a recent fall.  He also has thrombocytopenia. 2.  Giant cell arteritis with visual involvement: Visual problems unfortunately increase the risk of falls. 3.  Thrombocytopenia: Coupled with mild microcytic anemia suggests possible myelodysplasia.  Recent B12 level was normal.  Recommend hematology evaluation.     For questions or updates, please contact [place] Please consult [email or web address] for contact info under        Signed, Sanda Klein, MD  06/02/2019, 9:48 AM

## 2019-06-02 NOTE — CV Procedure (Signed)
   TRANSESOPHAGEAL ECHOCARDIOGRAM GUIDED DIRECT CURRENT CARDIOVERSION  NAME:  Matthew Hickman   MRN: [id number] DOB:  12-Jan-1926   ADMIT DATE: 05/29/2019  INDICATIONS: Symptomatic atrial flutter  PROCEDURE:   Informed consent was obtained prior to the procedure. The risks, benefits and alternatives for the procedure were discussed and the patient comprehended these risks.  Risks include, but are not limited to, cough, sore throat, vomiting, nausea, somnolence, esophageal and stomach trauma or perforation, bleeding, low blood pressure, aspiration, pneumonia, infection, trauma to the teeth and death.    After a procedural time-out, the oropharynx was anesthetized and the patient was sedated by the anesthesia service. The transesophageal probe was inserted in the esophagus and stomach without difficulty and multiple views were obtained. Anesthesia was monitored by Dr. Garrison Columbus, CRNA.   COMPLICATIONS:    Complications: No complications Patient tolerated procedure well.  FINDINGS:  No LAA thrombus   CARDIOVERSION:     Indications:  Symptomatic Atrial Flutter  Procedure Details:  Once the TEE was complete, the patient had the defibrillator pads placed in the anterior and posterior position. Once an appropriate level of sedation was confirmed, the patient was cardioverted x 1 with 100J of biphasic synchronized energy.  The patient converted to NSR in 90s with frequent PACs.  There were no apparent complications.  The patient had normal neuro status and respiratory status post procedure with vitals stable as recorded elsewhere.  Adequate airway was maintained throughout and vital signs monitored per protocol.  Oswaldo Milian MD [facility]  [address], [place], [place] [address] [phone]   8:12 AM

## 2019-06-02 NOTE — Anesthesia Postprocedure Evaluation (Signed)
Anesthesia Post Note  Patient: Matthew Hickman  Procedure(s) Performed: TRANSESOPHAGEAL ECHOCARDIOGRAM (TEE) (N/A ) CARDIOVERSION (N/A )     Patient location during evaluation: PACU Anesthesia Type: General Level of consciousness: awake and alert Pain management: pain level controlled Vital Signs Assessment: post-procedure vital signs reviewed and stable Respiratory status: spontaneous breathing, nonlabored ventilation, respiratory function stable and patient connected to nasal cannula oxygen Cardiovascular status: blood pressure returned to baseline and stable Postop Assessment: no apparent nausea or vomiting Anesthetic complications: no    Last Vitals:  Vitals:   06/02/19 0835 06/02/19 0841  BP: (!) 105/39 113/76  Pulse: 91 94  Resp: 20 17  Temp:    SpO2: 100% 93%    Last Pain:  Vitals:   06/02/19 0841  TempSrc:   PainSc: 0-No pain                 Roseanna Koplin

## 2019-06-02 NOTE — Progress Notes (Signed)
  Echocardiogram Echocardiogram Transesophageal has been performed.  Saher Davee A Madelin Weseman 06/02/2019, 8:09 AM

## 2019-06-02 NOTE — Progress Notes (Signed)
Chaplain spoke with Matthew Hickman's daughter regarding notarizing Scientist, physiological.  The notary is not on campus today but chaplain and daughter discussed options of having paperwork notarized outside of hospital.  Daughter knows a Patent examiner and is also aware that going to a local bank is an option.   Chaplain will leave note to check back in on Monday when possible notary will be available.

## 2019-06-02 NOTE — Progress Notes (Signed)
Left posterior forearm with ecchymosis,  with blister with purulent discharge in minimal amount. Denied any pain. MD assessed site with order. Continue to monitor.

## 2019-06-02 NOTE — Transfer of Care (Signed)
Immediate Anesthesia Transfer of Care Note  Patient: Matthew Hickman  Procedure(s) Performed: TRANSESOPHAGEAL ECHOCARDIOGRAM (TEE) (N/A ) CARDIOVERSION (N/A )  Patient Location: Endoscopy Unit  Anesthesia Type:General  Level of Consciousness: awake, alert  and drowsy  Airway & Oxygen Therapy: Patient Spontanous Breathing and Patient connected to nasal cannula oxygen  Post-op Assessment: Report given to RN, Post -op Vital signs reviewed and stable and Patient moving all extremities X 4  Post vital signs: Reviewed and stable  Last Vitals:  Vitals Value Taken Time  BP    Temp    Pulse    Resp    SpO2      Last Pain:  Vitals:   06/02/19 0710  TempSrc: Oral  PainSc: 0-No pain         Complications: No apparent anesthesia complications

## 2019-06-02 NOTE — Plan of Care (Signed)
  Problem: Education: Goal: Knowledge of General Education information will improve Description: Including pain rating scale, medication(s)/side effects and non-pharmacologic comfort measures Outcome: Progressing   Problem: Clinical Measurements: Goal: Ability to maintain clinical measurements within normal limits will improve Outcome: Progressing   Problem: Clinical Measurements: Goal: Cardiovascular complication will be avoided Outcome: Progressing   Problem: Nutrition: Goal: Adequate nutrition will be maintained Outcome: Progressing   Problem: Pain Managment: Goal: General experience of comfort will improve Outcome: Progressing   Problem: Safety: Goal: Ability to remain free from injury will improve Outcome: Progressing   Problem: Skin Integrity: Goal: Risk for impaired skin integrity will decrease Outcome: Progressing

## 2019-06-03 ENCOUNTER — Inpatient Hospital Stay (HOSPITAL_COMMUNITY): Payer: Medicare Other

## 2019-06-03 DIAGNOSIS — D649 Anemia, unspecified: Secondary | ICD-10-CM | POA: Diagnosis not present

## 2019-06-03 DIAGNOSIS — I4891 Unspecified atrial fibrillation: Secondary | ICD-10-CM | POA: Diagnosis not present

## 2019-06-03 DIAGNOSIS — D696 Thrombocytopenia, unspecified: Secondary | ICD-10-CM | POA: Diagnosis not present

## 2019-06-03 LAB — CBC
HCT: 27.8 % — ABNORMAL LOW (ref 39.0–52.0)
Hemoglobin: 9.4 g/dL — ABNORMAL LOW (ref 13.0–17.0)
MCH: 33.7 pg (ref 26.0–34.0)
MCHC: 33.8 g/dL (ref 30.0–36.0)
MCV: 99.6 fL (ref 80.0–100.0)
Platelets: 79 10*3/uL — ABNORMAL LOW (ref 150–400)
RBC: 2.79 MIL/uL — ABNORMAL LOW (ref 4.22–5.81)
RDW: 20 % — ABNORMAL HIGH (ref 11.5–15.5)
WBC: 6.6 10*3/uL (ref 4.0–10.5)
nRBC: 0.5 % — ABNORMAL HIGH (ref 0.0–0.2)

## 2019-06-03 LAB — BASIC METABOLIC PANEL
Anion gap: 9 (ref 5–15)
BUN: 38 mg/dL — ABNORMAL HIGH (ref 8–23)
CO2: 27 mmol/L (ref 22–32)
Calcium: 8.7 mg/dL — ABNORMAL LOW (ref 8.9–10.3)
Chloride: 104 mmol/L (ref 98–111)
Creatinine, Ser: 0.73 mg/dL (ref 0.61–1.24)
GFR calc Af Amer: >60 mL/min (ref >60)
GFR calc non Af Amer: >60 mL/min (ref >60)
Glucose, Bld: 175 mg/dL — ABNORMAL HIGH (ref 70–99)
Potassium: 4.2 mmol/L (ref 3.5–5.1)
Sodium: 140 mmol/L (ref 135–145)

## 2019-06-03 MED ORDER — TRAZODONE HCL 50 MG PO TABS: 50.0000 mg | ORAL_TABLET | Freq: Once | ORAL | Status: DC

## 2019-06-03 MED ORDER — AMIODARONE HCL 200 MG PO TABS
400.0000 mg | ORAL_TABLET | Freq: Two times a day (BID) | ORAL | Status: DC
  Administered 2019-06-03 – 2019-06-07 (×8): 400 mg via ORAL
  Filled 2019-06-03 (×8): qty 2

## 2019-06-03 MED ORDER — GADOBUTROL 1 MMOL/ML IV SOLN
5.5000 mL | Freq: Once | INTRAVENOUS | Status: AC | PRN
  Administered 2019-06-03: 5.5 mL via INTRAVENOUS

## 2019-06-03 NOTE — Progress Notes (Signed)
Progress Note  Patient Name: Matthew Hickman Date of Encounter: 06/03/2019  Primary Cardiologist: Sherren Mocha, MD   Patient Profile     86 y.o. male with hypertension, giant cell arteritis with visual involvement, presenting with atypical atrial flutter with rapid ventricular response, difficult to rate control, moderate thrombocytopenia without bleeding complications anticoagulation with Apixoban  Rate control with metop and dig>> amiodarone   Echo EF 3/21 >> EF 55-60%  Pulm Htn Mild  Subjective   No chest pain or palps or SOB But does not feel quite right   Inpatient Medications    Scheduled Meds: . [START ON 06/16/2019] amiodarone  200 mg Oral Daily  . amiodarone  400 mg Oral Daily  . apixaban  2.5 mg Oral BID  . ciprofloxacin  500 mg Oral BID  . feeding supplement (ENSURE ENLIVE)  237 mL Oral TID BM  . metoprolol tartrate  75 mg Oral BID  . multivitamin with minerals  1 tablet Oral Daily  . pantoprazole  20 mg Oral Daily  . predniSONE  30 mg Oral BID WC  . sodium chloride flush  3 mL Intravenous Q12H  . terazosin  1 mg Oral QHS  . traZODone  50 mg Oral Once   Continuous Infusions: . sodium chloride    . diltiazem (CARDIZEM) infusion Stopped (06/01/19 2345)  . vancomycin     PRN Meds: sodium chloride, acetaminophen, ondansetron (ZOFRAN) IV, sodium chloride flush   Vital Signs    Vitals:   06/02/19 2356 06/03/19 0400 06/03/19 0541 06/03/19 0800  BP: 118/76 107/88 129/65 130/79  Pulse: 99 71 99 83  Resp: 18 20 20 19   Temp: 97.6 F (36.4 C) 98.4 F (36.9 C) 98.4 F (36.9 C) (!) 97.5 F (36.4 C)  TempSrc: Oral Oral Oral Oral  SpO2: 95% 96% 94% 94%  Weight:   55.9 kg   Height:        Intake/Output Summary (Last 24 hours) at 06/03/2019 0843 Last data filed at 06/03/2019 0543 Gross per 24 hour  Intake 1164.77 ml  Output 850 ml  Net 314.77 ml   Last 3 Weights 06/03/2019 06/02/2019 06/01/2019  Weight (lbs) 123 lb 3.8 oz 124 lb 9 oz 118 lb 13.3 oz  Weight  (kg) 55.9 kg 56.5 kg 53.9 kg      Telemetry    Sinus with freq PAC and non sustained atrial tach   ECG    Sinus rhythm with very frequent PACs, unusual "spiky" P wave morphology, nonspecific T wave flattening - agree with interpretation although Pwaves are all relatively similar   Physical Exam   Well developed and nourished in no acute distress HENT normal Neck supple with JVP-  Flat  Clear Irregular rate and rhythm,   Abd-soft with active BS No Clubbing cyanosis edema Skin-warm and dry A & Oriented  Grossly normal sensory and motor function     Labs    High Sensitivity Troponin:  No results for input(s): TROPONINIHS in the last 720 hours.    Chemistry Recent Labs  Lab 06/01/19 0248 06/02/19 0210 06/03/19 0155  NA 137 137 140  K 4.3 4.2 4.2  CL 101 101 104  CO2 27 28 27   GLUCOSE 201* 184* 175*  BUN 40* 40* 38*  CREATININE 0.77 0.86 0.73  CALCIUM 8.7* 8.5* 8.7*  GFRNONAA >60 >60 >60  GFRAA >60 >60 >60  ANIONGAP 9 8 9      Hematology Recent Labs  Lab 06/01/19 0248 06/02/19 0210 06/03/19 0155  WBC 9.6 7.6 6.6  RBC 2.79* 2.80* 2.79*  HGB 9.2* 9.6* 9.4*  HCT 27.7* 28.0* 27.8*  MCV 99.3 100.0 99.6  MCH 33.0 34.3* 33.7  MCHC 33.2 34.3 33.8  RDW 20.1* 19.9* 20.0*  PLT 71* 73* 79*    BNPNo results for input(s): BNP, PROBNP in the last 168 hours.   DDimer No results for input(s): DDIMER in the last 168 hours.   Radiology    DG Forearm Left  Result Date: 06/02/2019 CLINICAL DATA:  Swelling and tenderness.  oozing sores on forearm. EXAM: LEFT FOREARM - 2 VIEW COMPARISON:  None. FINDINGS: No fracture in the radius or ulna. A 7 mm lucency is identified in the anterior cortex of the radius, near the junction of the middle and distal thirds, indeterminate. No evidence for soft tissue gas. IMPRESSION: 7 mm indeterminate lucency in the anterior cortex of the mid radius. If the patient has a soft tissue sore in this region, MRI may be warranted to exclude  osteomyelitis. Electronically Signed   By: Misty Stanley M.D.   On: 06/02/2019 10:41   ECHO TEE  Result Date: 06/02/2019    TRANSESOPHOGEAL ECHO REPORT   Patient Name:   [name] Date of Exam: 06/02/2019 Medical Rec #:  [id number]      Height:       68.0 in Accession #:    [id number]     Weight:       124.6 lb Date of Birth:  1925-10-16      BSA:          1.671 m Patient Age:    75 years       BP:           114/55 mmHg Patient Gender: M              HR:           81 bpm. Exam Location:  Inpatient Procedure: Transesophageal Echo, Cardiac Doppler and Color Doppler Indications:     Atrial fibrillation 427.31/I48.91  History:         Patient has prior history of Echocardiogram examinations, most                  recent 04/05/2019. Arrythmias:Atrial Flutter and Tachycardia;                  Risk Factors:Hypertension.  Sonographer:     Vikki Ports Turrentine Referring Phys:  [id number] Donato Heinz Diagnosing Phys: Oswaldo Milian MD PROCEDURE: The transesophogeal probe was passed without difficulty through the esophogus of the patient. Sedation performed by different physician. The patient was monitored while under deep sedation. Anesthestetic sedation was provided intravenously by Anesthesiology: 209mg  of Propofol. The patient developed no complications during the procedure. IMPRESSIONS  1. Left ventricular ejection fraction, by estimation, is 55 to 60%. The left ventricle has normal function. The left ventricle has no regional wall motion abnormalities.  2. Right ventricular systolic function is normal. The right ventricular size is normal.  3. The mitral valve is normal in structure. Mild mitral valve regurgitation.  4. The aortic valve is tricuspid. Aortic valve regurgitation is mild. No aortic stenosis is present.  5. Aortic dilatation noted. There is mild dilatation of the aortic root measuring 40 mm.  6. No left atrial/left atrial appendage thrombus was detected. FINDINGS  Left Ventricle: Left  ventricular ejection fraction, by estimation, is 55 to 60%. The left ventricle has normal function. The left ventricle has no regional wall  motion abnormalities. The left ventricular internal cavity size was normal in size. There is  no left ventricular hypertrophy. Right Ventricle: The right ventricular size is normal. Right vetricular wall thickness was not assessed. Right ventricular systolic function is normal. Left Atrium: Left atrial size was normal in size. No left atrial/left atrial appendage thrombus was detected. Right Atrium: Right atrial size was normal in size. Pericardium: There is no evidence of pericardial effusion. Mitral Valve: The mitral valve is normal in structure. Mild mitral valve regurgitation. Tricuspid Valve: The tricuspid valve is normal in structure. Tricuspid valve regurgitation is mild. Aortic Valve: The aortic valve is tricuspid. Aortic valve regurgitation is mild. No aortic stenosis is present. Pulmonic Valve: The pulmonic valve was grossly normal. Pulmonic valve regurgitation is mild. Aorta: Aortic dilatation noted. There is mild dilatation of the aortic root measuring 40 mm. IAS/Shunts: No atrial level shunt detected by color flow Doppler. Oswaldo Milian MD Electronically signed by Oswaldo Milian MD Signature Date/Time: 06/02/2019/3:46:30 PM    Final     Cardiac Studies   Echo 04/05/19  1. Left ventricular ejection fraction, by estimation, is 55 to 60%. The  left ventricle has normal function. The left ventricle has no regional  wall motion abnormalities. Left ventricular diastolic parameters are  consistent with Grade I diastolic  dysfunction (impaired relaxation).  2. Right ventricular systolic function is normal. The right ventricular  size is normal. There is moderately elevated pulmonary artery systolic  pressure. The estimated right ventricular systolic pressure is [id number] mmHg.  3. The mitral valve is normal in structure and function. Trivial mitral    valve regurgitation. No evidence of mitral stenosis.  4. The aortic valve is tricuspid. Aortic valve regurgitation is not  visualized. No aortic stenosis is present.  5. Aortic dilatation noted. There is mild dilatation of the ascending  aorta measuring 40 mm.  6. The inferior vena cava is dilated in size with <50% respiratory  variability, suggesting right atrial pressure of 15 mmHg.    Assessment & Plan    Atrial flutter Atypical s/p DCCV  High Risk Medication Surveillance Amiodarone  Giant cell arteritis  Thrombocytopenia/anemia  Concerning  >> heme consult ?   Continue amiodarone for rhythm-- surveillance labs normal 3/21 Will increase 400 daily >> bid   Ambulate slowly       For questions or updates, please contact [place] Please consult [email or web address] for contact info under        Signed, Virl Axe, MD  06/03/2019, 8:43 AM

## 2019-06-03 NOTE — Plan of Care (Signed)
  Problem: Education: Goal: Knowledge of General Education information will improve Description: Including pain rating scale, medication(s)/side effects and non-pharmacologic comfort measures Outcome: Progressing   Problem: Clinical Measurements: Goal: Ability to maintain clinical measurements within normal limits will improve Outcome: Progressing   Problem: Clinical Measurements: Goal: Cardiovascular complication will be avoided Outcome: Progressing   Problem: Activity: Goal: Risk for activity intolerance will decrease Outcome: Progressing   Problem: Nutrition: Goal: Adequate nutrition will be maintained Outcome: Progressing   Problem: Pain Managment: Goal: General experience of comfort will improve Outcome: Progressing   Problem: Safety: Goal: Ability to remain free from injury will improve Outcome: Progressing   Problem: Skin Integrity: Goal: Risk for impaired skin integrity will decrease Outcome: Progressing

## 2019-06-03 NOTE — Progress Notes (Signed)
PROGRESS NOTE  Matthew Hickman [id number] DOB: Nov 20, 1925 DOA: 05/29/2019 PCP: [name], MD  HPI/Recap of past 24 hours: Matthew Hickman is a 86 y.o. male with medical history significant of hypertension, recently diagnosed paroxysmal A. 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   ED Course: CT head shows chronic changes, and patient was found in A. fib with rapid ventricular rate.  Cardiology was consulted and [facility] asked to admit.  Received amiodarone drip and 3 doses of oral digoxin 0.25 mg every 2 hours.  Metoprolol dose was increased to 75 mg daily.  Had a TEE cardioversion on 06/02/2019.  Converted to sinus rhythm but with frequent PACs.  Digoxin was stopped.  Care managed by cardiology.  Hospital course complicated by left forearm cellulitis with purulence.  Was started on IV vancomycin on 06/02/2019.  X-ray of forearm showed bone lucency, osteomyelitis needs to be ruled out, MRI ordered.  06/03/19: Patient was seen and examined at his bedside.  Some left forearm tenderness on palpation.  Denies any chest pain, palpitation or dyspnea while laying in bed.    Spoke with Dr. Alvy Bimler regarding thrombocytopenia and macrocytic anemia for evaluation of possible myelodysplastic syndrome.  Medical oncology will see the patient in consultation.   Assessment/Plan: Active Problems:   A-fib (HCC)  A. flutter with RVR post TEE guided cardioversion on 06/02/19 by Dr. Gilman Schmidt Rate fluctuates between the 40s and 105, noted to be in sinus rhythm with sinus arrhythmia, PACs. Currently on diltiazem.,  Amiodarone switched to oral Off digoxin Also on Lopressor 75 mg twice daily Continue to closely monitor on telemetry Continue Eliquis for CVA prevention Last 2D echo on 04/05/19> normal LVEF 55 to 60% with grade 1 diastolic dysfunction.  Left forearm purulent cellulitis, rule out osteomyelitis X-ray forearm shows lucency  in the bone, will obtain MRI to rule out osteomyelitis Continue to treat empirically with IV vancomycin due to chronic use of steroids with immunosuppression and purulence IV vancomycin day #2; will treat with IV antibiotics for 2 to 3 days, once improved we will switch to oral antibiotics for total of 10-14 days if no evidence of osteomyelitis Currently afebrile with no leukocytosis Pending MRI result for possible surgical debridement  Resolved hypotensive, likely iatrogenic Blood pressure is at goal.  Pseudomonas aeruginosa UTI Presented with pyuria Urine culture growing 60,000 colonies of Pseudomonas aeruginosa, pansensitive.  Received 3 days of Rocephin. Switch to Cipro x5 days, day number 3 out of 5.  Acute thrombocytopenia/macrocytic anemia rule out MDS Platelets 83K>> 71K>> 73K> 79K Hemoglobin 9.4 with MCV 99.6 Hematology has been consulted, Dr. Alvy Bimler will see in consultation.  Giant cell arteritis Continue prednisone home regimen Continue PPI for GI prophylaxis  Moderate pulmonary hypertension Euvolemic on exam Management per cardiology's recommendation  Macrocytic anemia with acute blood loss Baseline hemoglobin 11 Hemoglobin 9.1> 10.4>> 9.2>> 9.6> 9.4 MCV of 101>> 99.2> 99.6  BPH Continue Terazosin.   Monitor urine output No evidence of acute renal failure.  Physical debility PT OT assessment recommended SNF TOC consulted to assist with SNF placement. Continue PT OT with assistance and fall precautions.  DVT prophylaxis:  Eliquis Code Status: Full code Family Communication:  Will call family if okay with the patient.  Disposition Plan:  Patient is from home.  Anticipate discharge to home in the next 48 hours or when cardiology and hematology/oncology sign off.  Barrier to discharge: Ongoing management for A. fib/A-flutter, and  left upper extremity purulent cellulitis.    Consults called: cardiology, hematology oncology       Objective: Vitals:    06/03/19 0400 06/03/19 0541 06/03/19 0800 06/03/19 0848  BP: 107/88 129/65 130/79 130/79  Pulse: 71 99 83 (!) 127  Resp: 20 20 19    Temp: 98.4 F (36.9 C) 98.4 F (36.9 C) (!) 97.5 F (36.4 C)   TempSrc: Oral Oral Oral   SpO2: 96% 94% 94%   Weight:  55.9 kg    Height:        Intake/Output Summary (Last 24 hours) at 06/03/2019 0941 Last data filed at 06/03/2019 0543 Gross per 24 hour  Intake 1148.1 ml  Output 850 ml  Net 298.1 ml   Filed Weights   06/01/19 0642 06/02/19 0418 06/03/19 0541  Weight: 53.9 kg 56.5 kg 55.9 kg    Exam:  . General: 86 y.o. year-old male well-developed well-nourished no distress.  Alert and interactive.  Very hard of hearing.   . Cardiovascular: Regular rate and rhythm no rubs or gallops.   Marland Kitchen Respiratory: Clear to auscultation no wheezes no rales.   . Abdomen: Soft nontender normal bowel sounds present.   . Musculoskeletal: Left forearm with tenderness on palpation, some purulence noted. Marland Kitchen Psychiatry: Mood is appropriate for condition and setting.    Data Reviewed: CBC: Recent Labs  Lab 05/30/19 0341 05/31/19 0810 06/01/19 0248 06/02/19 0210 06/03/19 0155  WBC 9.0 11.9* 9.6 7.6 6.6  HGB 9.1* 10.4* 9.2* 9.6* 9.4*  HCT 28.2* 32.1* 27.7* 28.0* 27.8*  MCV 101.8* 101.6* 99.3 100.0 99.6  PLT 65* 83* 71* 73* 79*   Basic Metabolic Panel: Recent Labs  Lab 05/30/19 0341 05/31/19 0235 06/01/19 0248 06/02/19 0210 06/03/19 0155  NA 140 138 137 137 140  K 4.2 4.3 4.3 4.2 4.2  CL 102 100 101 101 104  CO2 27 28 27 28 27   GLUCOSE 166* 174* 201* 184* 175*  BUN 34* 39* 40* 40* 38*  CREATININE 0.84 0.84 0.77 0.86 0.73  CALCIUM 8.6* 8.6* 8.7* 8.5* 8.7*  MG 1.9  --   --   --   --    GFR: Estimated Creatinine Clearance: 47.6 mL/min (by C-G formula based on SCr of 0.73 mg/dL). Liver Function Tests: No results for input(s): AST, ALT, ALKPHOS, BILITOT, PROT, ALBUMIN in the last 168 hours. No results for input(s): LIPASE, AMYLASE in the last 168  hours. No results for input(s): AMMONIA in the last 168 hours. Coagulation Profile: No results for input(s): INR, PROTIME in the last 168 hours. Cardiac Enzymes: No results for input(s): CKTOTAL, CKMB, CKMBINDEX, TROPONINI in the last 168 hours. BNP (last 3 results) No results for input(s): PROBNP in the last 8760 hours. HbA1C: No results for input(s): HGBA1C in the last 72 hours. CBG: Recent Labs  Lab 05/29/19 1918  GLUCAP 218*   Lipid Profile: No results for input(s): CHOL, HDL, LDLCALC, TRIG, CHOLHDL, LDLDIRECT in the last 72 hours. Thyroid Function Tests: No results for input(s): TSH, T4TOTAL, FREET4, T3FREE, THYROIDAB in the last 72 hours. Anemia Panel: No results for input(s): VITAMINB12, FOLATE, FERRITIN, TIBC, IRON, RETICCTPCT in the last 72 hours. Urine analysis:    Component Value Date/Time   COLORURINE YELLOW 05/29/2019 1211   APPEARANCEUR HAZY (A) 05/29/2019 1211   LABSPEC 1.015 05/29/2019 1211   PHURINE 7.0 05/29/2019 1211   GLUCOSEU NEGATIVE 05/29/2019 1211   HGBUR SMALL (A) 05/29/2019 1211   BILIRUBINUR NEGATIVE 05/29/2019 [place] 05/29/2019 1211  PROTEINUR 30 (A) 05/29/2019 1211   UROBILINOGEN 1.0 09/20/2010 1226   NITRITE NEGATIVE 05/29/2019 1211   LEUKOCYTESUR LARGE (A) 05/29/2019 1211   Sepsis Labs: @LABRCNTIP (procalcitonin:4,lacticidven:4)  ) Recent Results (from the past 240 hour(s))  Respiratory Panel by RT PCR (Flu A&B, Covid) - Nasopharyngeal Swab     Status: None   Collection Time: 05/29/19  1:58 PM   Specimen: Nasopharyngeal Swab  Result Value Ref Range Status   SARS Coronavirus 2 by RT PCR NEGATIVE NEGATIVE Final    Comment: (NOTE) SARS-CoV-2 target nucleic acids are NOT DETECTED. The SARS-CoV-2 RNA is generally detectable in upper respiratoy specimens during the acute phase of infection. The lowest concentration of SARS-CoV-2 viral copies this assay can detect is 131 copies/mL. A negative result does not preclude  SARS-Cov-2 infection and should not be used as the sole basis for treatment or other patient management decisions. A negative result may occur with  improper specimen collection/handling, submission of specimen other than nasopharyngeal swab, presence of viral mutation(s) within the areas targeted by this assay, and inadequate number of viral copies (<131 copies/mL). A negative result must be combined with clinical observations, patient history, and epidemiological information. The expected result is Negative. Fact Sheet for Patients:  PinkCheek.be Fact Sheet for Healthcare Providers:  GravelBags.it This test is not yet ap proved or cleared by the [place] FDA and  has been authorized for detection and/or diagnosis of SARS-CoV-2 by FDA under an Emergency Use Authorization (EUA). This EUA will remain  in effect (meaning this test can be used) for the duration of the COVID-19 declaration under Section 564(b)(1) of the Act, 21 U.S.C. section 360bbb-3(b)(1), unless the authorization is terminated or revoked sooner.    Influenza A by PCR NEGATIVE NEGATIVE Final   Influenza B by PCR NEGATIVE NEGATIVE Final    Comment: (NOTE) The Xpert Xpress SARS-CoV-2/FLU/RSV assay is intended as an aid in  the diagnosis of influenza from Nasopharyngeal swab specimens and  should not be used as a sole basis for treatment. Nasal washings and  aspirates are unacceptable for Xpert Xpress SARS-CoV-2/FLU/RSV  testing. Fact Sheet for Patients: PinkCheek.be Fact Sheet for Healthcare Providers: GravelBags.it This test is not yet approved or cleared by the [place] FDA and  has been authorized for detection and/or diagnosis of SARS-CoV-2 by  FDA under an Emergency Use Authorization (EUA). This EUA will remain  in effect (meaning this test can be used) for the duration of the  Covid-19  declaration under Section 564(b)(1) of the Act, 21  U.S.C. section 360bbb-3(b)(1), unless the authorization is  terminated or revoked. Performed at [facility], [place] [address], [place], [place] [address]   MRSA PCR Screening     Status: None   Collection Time: 05/30/19  5:43 AM   Specimen: Nasal Mucosa; Nasopharyngeal  Result Value Ref Range Status   MRSA by PCR NEGATIVE NEGATIVE Final    Comment:        The GeneXpert MRSA Assay (FDA approved for NASAL specimens only), is one component of a comprehensive MRSA colonization surveillance program. It is not intended to diagnose MRSA infection nor to guide or monitor treatment for MRSA infections. Performed at [facility], [place] [address], [place],  [address]   Culture, Urine     Status: Abnormal   Collection Time: 05/31/19  6:23 AM   Specimen: Urine, Random  Result Value Ref Range Status   Specimen Description URINE, RANDOM  Final   Special Requests  Final    NONE Performed at [facility], [place] [address], [place], [place] [address]    Culture 60,000 COLONIES/mL PSEUDOMONAS AERUGINOSA (A)  Final   Report Status 06/02/2019 FINAL  Final   Organism ID, Bacteria PSEUDOMONAS AERUGINOSA (A)  Final      Susceptibility   Pseudomonas aeruginosa - MIC*    CEFTAZIDIME 2 SENSITIVE Sensitive     CIPROFLOXACIN <=0.25 SENSITIVE Sensitive     GENTAMICIN 2 SENSITIVE Sensitive     IMIPENEM 2 SENSITIVE Sensitive     PIP/TAZO <=4 SENSITIVE Sensitive     CEFEPIME 4 SENSITIVE Sensitive     * 60,000 COLONIES/mL PSEUDOMONAS AERUGINOSA      Studies: DG Forearm Left  Result Date: 06/02/2019 CLINICAL DATA:  Swelling and tenderness.  oozing sores on forearm. EXAM: LEFT FOREARM - 2 VIEW COMPARISON:  None. FINDINGS: No fracture in the radius or ulna. A 7 mm lucency is identified in the anterior cortex of the radius, near the junction of the middle and distal thirds, indeterminate. No evidence for soft tissue gas.  IMPRESSION: 7 mm indeterminate lucency in the anterior cortex of the mid radius. If the patient has a soft tissue sore in this region, MRI may be warranted to exclude osteomyelitis. Electronically Signed   By: Misty Stanley M.D.   On: 06/02/2019 10:41    Scheduled Meds: . [START ON 06/16/2019] amiodarone  200 mg Oral Daily  . amiodarone  400 mg Oral BID  . apixaban  2.5 mg Oral BID  . ciprofloxacin  500 mg Oral BID  . feeding supplement (ENSURE ENLIVE)  237 mL Oral TID BM  . metoprolol tartrate  75 mg Oral BID  . multivitamin with minerals  1 tablet Oral Daily  . pantoprazole  20 mg Oral Daily  . predniSONE  30 mg Oral BID WC  . sodium chloride flush  3 mL Intravenous Q12H  . terazosin  1 mg Oral QHS  . traZODone  50 mg Oral Once    Continuous Infusions: . sodium chloride    . diltiazem (CARDIZEM) infusion Stopped (06/01/19 2345)  . vancomycin       LOS: 5 days     Kayleen Memos, MD [facility] Pager [phone]  If 7PM-7AM, please contact night-coverage [email or web address] Password [facility] 06/03/2019, 9:41 AM

## 2019-06-03 NOTE — Consult Note (Signed)
[facility] NOTE  Patient Care Team: [name], MD as PCP - General (Internal Medicine) Sherren Mocha, MD as PCP - Cardiology (Cardiology)  CHIEF COMPLAINTS/PURPOSE OF CONSULTATION:  Requested by hospitalist team to evaluate this patient with acute thrombocytopenia  HISTORY OF PRESENTING ILLNESS:  Matthew Hickman 86 y.o. male is seen because of thrombocytopenia He has poor hearing but appears alert and completely oriented I have the opportunity to review his CBC over the past few months The patient has complex medical history including history of skin cancer and melanoma, recent diagnosis of arteritis on prednisone, recurrent atrial fibrillation with rapid ventricular response and others He was admitted through the emergency department after presenting with a fall on 05/29/2019 The patient have generalized weakness and visual changes due to giant cell arteritis and fell at home On admission, he was also noted to have atrial fibrillation On review of his electronic records, he has intermittent thrombocytopenia dated back to 2012 For example, on 09/17/2010, his white blood cell count was 11.8, hemoglobin 18.2 and platelet count of 101 Most recently, in February, his white count was 7.4, hemoglobin 11.1 and platelet count of 221 In April 10, 2019, white blood cell count 4.5, hemoglobin 10.3 and platelet count of 208  On admission, his white blood cell count was 10.2, hemoglobin 10.2 and platelet count was 68,000 He has excessive bruises but denies bleeding such as epistaxis, hematuria, melena or hematochezia During this hospitalization, urine culture came back positive for Pseudomonas He has been placed on multiple different antibiotics since admission including ciprofloxacin, vancomycin as well as Rocephin  Over the course of the last 5 days, his platelet count slowly trended up and currently fluctuating between 71,000-83,000  According to the patient, his appetite  has been fair He drinks since the age of 53, 3-4 bourbon mixed with coke every day He denies recent smoking  MEDICAL HISTORY:  Past Medical History:  Diagnosis Date  . Cancer ([place])    skin  . Hypertension     SURGICAL HISTORY: Past Surgical History:  Procedure Laterality Date  . APPENDECTOMY    . ARTERY BIOPSY Bilateral 04/07/2019   Procedure: BIOPSY TEMPORAL ARTERY;  Surgeon: Jesusita Oka, MD;  Location: [facility];  Service: General;  Laterality: Bilateral;  . MELANOMA EXCISION Bilateral 01/30/2019   Procedure: WIDE LOCAL EXCISION WITH ADVANCEMENT FLAP CLOSURE OF MELANOMA LEFT NECK AND RIGHT BACK;  Surgeon: [name], MD;  Location: [facility];  Service: General;  Laterality: Bilateral;    SOCIAL HISTORY: Social History   Socioeconomic History  . Marital status: Married    Spouse name: Not on file  . Number of children: Not on file  . Years of education: Not on file  . Highest education level: Not on file  Occupational History  . Not on file  Tobacco Use  . Smoking status: Never Smoker  . Smokeless tobacco: Never Used  Substance and Sexual Activity  . Alcohol use: Yes    Comment: 1 glass of bourbon per night  . Drug use: Never  . Sexual activity: Not on file  Other Topics Concern  . Not on file  Social History Narrative  . Not on file   Social Determinants of Health   Financial Resource Strain:   . Difficulty of Paying Living Expenses:   Food Insecurity:   . Worried About Charity fundraiser in the Last Year:   . Arboriculturist in the Last Year:   [employer]  Needs:   . Lack of Transportation (Medical):   Marland Kitchen Lack of Transportation (Non-Medical):   Physical Activity:   . Days of Exercise per Week:   . Minutes of Exercise per Session:   Stress:   . Feeling of Stress :   Social Connections:   . Frequency of Communication with Friends and Family:   . Frequency of Social Gatherings with Friends and Family:   . Attends Religious Services:   . Active Member  of Clubs or Organizations:   . Attends Archivist Meetings:   Marland Kitchen Marital Status:   Intimate Partner Violence:   . Fear of Current or Ex-Partner:   . Emotionally Abused:   Marland Kitchen Physically Abused:   . Sexually Abused:     FAMILY HISTORY: Family History  Problem Relation Age of Onset  . Heart attack Father        Died at age 55  . Heart failure Father     ALLERGIES:  is allergic to tape.  MEDICATIONS:  Current Facility-Administered Medications  Medication Dose Route Frequency Provider Last Rate Last Admin  . 0.9 %  sodium chloride infusion  250 mL Intravenous PRN Wynetta Fines T, MD      . acetaminophen (TYLENOL) tablet 650 mg  650 mg Oral Q4H PRN Wynetta Fines T, MD      . amiodarone (PACERONE) tablet 400 mg  400 mg Oral BID Deboraha Sprang, MD      . apixaban Arne Cleveland) tablet 2.5 mg  2.5 mg Oral BID Lyndee Leo, RPH   2.5 mg at 06/03/19 1751  . ciprofloxacin (CIPRO) tablet 500 mg  500 mg Oral BID Irene Pap N, DO   500 mg at 06/03/19 0858  . diltiazem (CARDIZEM) 125 mg in dextrose 5% 125 mL (1 mg/mL) infusion  5-15 mg/hr Intravenous Titrated Furth, Cadence H, PA-C   Stopped at 06/01/19 2345  . feeding supplement (ENSURE ENLIVE) (ENSURE ENLIVE) liquid 237 mL  237 mL Oral TID BM Wynetta Fines T, MD   237 mL at 06/03/19 0859  . metoprolol tartrate (LOPRESSOR) tablet 75 mg  75 mg Oral BID Croitoru, Mihai, MD   75 mg at 06/03/19 0848  . multivitamin with minerals tablet 1 tablet  1 tablet Oral Daily Lequita Halt, MD   1 tablet at 06/03/19 [phone]  . ondansetron (ZOFRAN) injection 4 mg  4 mg Intravenous Q6H PRN Wynetta Fines T, MD      . pantoprazole (PROTONIX) EC tablet 20 mg  20 mg Oral Daily Wynetta Fines T, MD   20 mg at 06/03/19 0851  . predniSONE (DELTASONE) tablet 30 mg  30 mg Oral BID WC Wynetta Fines T, MD   30 mg at 06/03/19 0850  . sodium chloride flush (NS) 0.9 % injection 3 mL  3 mL Intravenous Q12H Wynetta Fines T, MD   3 mL at 06/02/19 2208  . sodium chloride flush (NS) 0.9 %  injection 3 mL  3 mL Intravenous PRN Wynetta Fines T, MD      . terazosin (HYTRIN) capsule 1 mg  1 mg Oral QHS Wynetta Fines T, MD   1 mg at 06/02/19 2207  . traZODone (DESYREL) tablet 50 mg  50 mg Oral Once Bodenheimer, Charles A, NP      . vancomycin (VANCOREADY) IVPB 750 mg/150 mL  750 mg Intravenous Q24H Lyndee Leo, RPH        REVIEW OF SYSTEMS:   Constitutional: Denies fevers, chills or abnormal night  sweats Eyes: Denies blurriness of vision, double vision or watery eyes Ears, nose, mouth, throat, and face: Denies mucositis or sore throat Respiratory: Denies cough, dyspnea or wheezes Cardiovascular: Denies palpitation, chest discomfort or lower extremity swelling Gastrointestinal:  Denies nausea, heartburn or change in bowel habits Lymphatics: Denies new lymphadenopathy  Behavioral/Psych: Mood is stable, no new changes  All other systems were reviewed with the patient and are negative.  PHYSICAL EXAMINATION: ECOG PERFORMANCE STATUS: 2 - Symptomatic, <50% confined to bed  Vitals:   06/03/19 0848 06/03/19 1100  BP: 130/79 120/63  Pulse: (!) 127 86  Resp:  17  Temp:  97.6 F (36.4 C)  SpO2:  94%   Filed Weights   06/01/19 0642 06/02/19 0418 06/03/19 0541  Weight: 118 lb 13.3 oz (53.9 kg) 124 lb 9 oz (56.5 kg) 123 lb 3.8 oz (55.9 kg)    GENERAL:alert, no distress and comfortable SKIN: He has excessive skin bruises and ecchymosis with thin skin.  There is an area of abnormalities over the left forearm. EYES: normal, conjunctiva are pink and non-injected, sclera clear OROPHARYNX:no exudate, no erythema and lips, buccal mucosa, and tongue normal  NECK: supple, thyroid normal size, non-tender, without nodularity LYMPH:  no palpable lymphadenopathy in the cervical, axillary or inguinal LUNGS: clear to auscultation and percussion with normal breathing effort HEART: irregular rate & rhythm and no murmurs and no lower extremity edema ABDOMEN:abdomen soft, non-tender and normal  bowel sounds Musculoskeletal:no cyanosis of digits and no clubbing  PSYCH: alert & oriented x 3 with fluent speech.  Very poor hearing NEURO: no focal motor/sensory deficits  LABORATORY DATA:  I have reviewed the data as listed Lab Results  Component Value Date   WBC 6.6 06/03/2019   HGB 9.4 (L) 06/03/2019   HCT 27.8 (L) 06/03/2019   MCV 99.6 06/03/2019   PLT 79 (L) 06/03/2019   I have reviewed his peripheral blood smear Absolute thrombocytopenia is seen without any signs of schistocytes or clumping Occasional polychromasia is seen White blood cell of normal morphology but predominantly neutrophilic is seen  RADIOGRAPHIC STUDIES: I have personally reviewed the radiological images as listed and agreed with the findings in the report. DG Elbow Complete Left  Result Date: 05/29/2019 CLINICAL DATA:  Fall, laceration to left elbow EXAM: LEFT ELBOW - COMPLETE 3+ VIEW COMPARISON:  None. FINDINGS: There is no evidence of fracture, dislocation, or joint effusion. There is no evidence of arthropathy or other focal bone abnormality. Soft tissues are unremarkable. IMPRESSION: Negative. Electronically Signed   By: Rolm Baptise M.D.   On: 05/29/2019 10:54   DG Forearm Left  Result Date: 06/02/2019 CLINICAL DATA:  Swelling and tenderness.  oozing sores on forearm. EXAM: LEFT FOREARM - 2 VIEW COMPARISON:  None. FINDINGS: No fracture in the radius or ulna. A 7 mm lucency is identified in the anterior cortex of the radius, near the junction of the middle and distal thirds, indeterminate. No evidence for soft tissue gas. IMPRESSION: 7 mm indeterminate lucency in the anterior cortex of the mid radius. If the patient has a soft tissue sore in this region, MRI may be warranted to exclude osteomyelitis. Electronically Signed   By: Misty Stanley M.D.   On: 06/02/2019 10:41   CT Head Wo Contrast  Result Date: 05/29/2019 CLINICAL DATA:  Head trauma.  Mechanical fall. EXAM: CT HEAD WITHOUT CONTRAST TECHNIQUE:  Contiguous axial images were obtained from the base of the skull through the vertex without intravenous contrast. COMPARISON:  04/07/2019 MRI  head. FINDINGS: Brain: No acute infarct or intracranial hemorrhage. Bilateral basal ganglia calcifications. Re-demonstration of small meningiomas overlying the right frontal and parietal convexities. No midline shift or extra-axial fluid collection. Vascular: No hyperdense vessel. Bilateral carotid siphon and V4 segment atherosclerotic calcifications. Skull: Negative for fracture or focal lesion. Sinuses/Orbits: Bilateral lens replacement. No mastoid effusion. Small left maxillary sinus mucous retention cyst. Other: None. IMPRESSION: No acute intracranial process. Mild cerebral atrophy. Unchanged small right cerebral convexity meningiomas. Electronically Signed   By: Primitivo Gauze M.D.   On: 05/29/2019 10:57   DG CHEST PORT 1 VIEW  Result Date: 05/29/2019 CLINICAL DATA:  Pain following fall EXAM: PORTABLE CHEST 1 VIEW COMPARISON:  April 04, 2019 FINDINGS: There is atelectatic change in the left base. Lungs elsewhere are clear. Heart size and pulmonary vascularity are normal. No adenopathy. There is aortic atherosclerosis. No pneumothorax. No appreciable bone lesions. IMPRESSION: Left base atelectasis. Lungs elsewhere clear. Stable cardiac silhouette. No pneumothorax. Aortic Atherosclerosis (ICD10-I70.0). Electronically Signed   By: Lowella Grip III M.D.   On: 05/29/2019 13:27   ECHO TEE  Result Date: 06/02/2019    TRANSESOPHOGEAL ECHO REPORT   Patient Name:   Matthew Hickman Date of Exam: 06/02/2019 Medical Rec #:  [id number]      Height:       68.0 in Accession #:    [id number]     Weight:       124.6 lb Date of Birth:  09-18-1925      BSA:          1.671 m Patient Age:    56 years       BP:           114/55 mmHg Patient Gender: M              HR:           81 bpm. Exam Location:  Inpatient Procedure: Transesophageal Echo, Cardiac Doppler and Color Doppler  Indications:     Atrial fibrillation 427.31/I48.91  History:         Patient has prior history of Echocardiogram examinations, most                  recent 04/05/2019. Arrythmias:Atrial Flutter and Tachycardia;                  Risk Factors:Hypertension.  Sonographer:     Vikki Ports Turrentine Referring Phys:  [id number] Donato Heinz Diagnosing Phys: Oswaldo Milian MD PROCEDURE: The transesophogeal probe was passed without difficulty through the esophogus of the patient. Sedation performed by different physician. The patient was monitored while under deep sedation. Anesthestetic sedation was provided intravenously by Anesthesiology: 232m of Propofol. The patient developed no complications during the procedure. IMPRESSIONS  1. Left ventricular ejection fraction, by estimation, is 55 to 60%. The left ventricle has normal function. The left ventricle has no regional wall motion abnormalities.  2. Right ventricular systolic function is normal. The right ventricular size is normal.  3. The mitral valve is normal in structure. Mild mitral valve regurgitation.  4. The aortic valve is tricuspid. Aortic valve regurgitation is mild. No aortic stenosis is present.  5. Aortic dilatation noted. There is mild dilatation of the aortic root measuring 40 mm.  6. No left atrial/left atrial appendage thrombus was detected. FINDINGS  Left Ventricle: Left ventricular ejection fraction, by estimation, is 55 to 60%. The left ventricle has normal function. The left ventricle has no regional wall motion abnormalities. The left  ventricular internal cavity size was normal in size. There is  no left ventricular hypertrophy. Right Ventricle: The right ventricular size is normal. Right vetricular wall thickness was not assessed. Right ventricular systolic function is normal. Left Atrium: Left atrial size was normal in size. No left atrial/left atrial appendage thrombus was detected. Right Atrium: Right atrial size was normal in size.  Pericardium: There is no evidence of pericardial effusion. Mitral Valve: The mitral valve is normal in structure. Mild mitral valve regurgitation. Tricuspid Valve: The tricuspid valve is normal in structure. Tricuspid valve regurgitation is mild. Aortic Valve: The aortic valve is tricuspid. Aortic valve regurgitation is mild. No aortic stenosis is present. Pulmonic Valve: The pulmonic valve was grossly normal. Pulmonic valve regurgitation is mild. Aorta: Aortic dilatation noted. There is mild dilatation of the aortic root measuring 40 mm. IAS/Shunts: No atrial level shunt detected by color flow Doppler. Oswaldo Milian MD Electronically signed by Oswaldo Milian MD Signature Date/Time: 06/02/2019/3:46:30 PM    Final     ASSESSMENT & PLAN Acute thrombocytopenia Multifactorial, likely due to consumption from recent fall, recent diagnosis of autoimmune disorder, bone marrow suppression from multiple antibiotics as well as urinary tract infection He has completed recent work-up with iron, B12 and folate studies Observe only for now I do not recommend further work-up such as bone marrow biopsy There is no contraindication to remain on antiplatelet agents or anticoagulants as long as the platelet is greater than 50,000.  Macrocytic anemia Mild polychromasia is seen on peripheral blood In March, reticulocyte count was inappropriately low given the degree of anemia, likely compounded by recent diagnosis of autoimmune disorder and possible bone marrow suppression from his acute illness He does not need transfusion support for now  Multiple other health issues We will defer to primary service  Overall, I do not feel strongly that his abnormal CBC represent bone marrow malignancy I do not recommend bone marrow aspirate and biopsy Recommend close monitoring of CBC He will return to check on him next week Please do not hesitate to call if questions arise  All questions were answered.  Heath Lark, MD 06/03/2019 12:03 PM

## 2019-06-03 NOTE — Progress Notes (Signed)
Pt transitioning from Amio gtt to PO (given early) Will continue to monitor hourly HR rate.

## 2019-06-04 DIAGNOSIS — I471 Supraventricular tachycardia: Secondary | ICD-10-CM | POA: Diagnosis not present

## 2019-06-04 DIAGNOSIS — I4891 Unspecified atrial fibrillation: Secondary | ICD-10-CM | POA: Diagnosis not present

## 2019-06-04 LAB — CBC
HCT: 28.5 % — ABNORMAL LOW (ref 39.0–52.0)
Hemoglobin: 9.3 g/dL — ABNORMAL LOW (ref 13.0–17.0)
MCH: 32.9 pg (ref 26.0–34.0)
MCHC: 32.6 g/dL (ref 30.0–36.0)
MCV: 100.7 fL — ABNORMAL HIGH (ref 80.0–100.0)
Platelets: 87 10*3/uL — ABNORMAL LOW (ref 150–400)
RBC: 2.83 MIL/uL — ABNORMAL LOW (ref 4.22–5.81)
RDW: 20.3 % — ABNORMAL HIGH (ref 11.5–15.5)
WBC: 6.7 10*3/uL (ref 4.0–10.5)
nRBC: 0.5 % — ABNORMAL HIGH (ref 0.0–0.2)

## 2019-06-04 LAB — BASIC METABOLIC PANEL
Anion gap: 8 (ref 5–15)
BUN: 37 mg/dL — ABNORMAL HIGH (ref 8–23)
CO2: 26 mmol/L (ref 22–32)
Calcium: 8.6 mg/dL — ABNORMAL LOW (ref 8.9–10.3)
Chloride: 106 mmol/L (ref 98–111)
Creatinine, Ser: 0.83 mg/dL (ref 0.61–1.24)
GFR calc Af Amer: >60 mL/min (ref >60)
GFR calc non Af Amer: >60 mL/min (ref >60)
Glucose, Bld: 152 mg/dL — ABNORMAL HIGH (ref 70–99)
Potassium: 4.3 mmol/L (ref 3.5–5.1)
Sodium: 140 mmol/L (ref 135–145)

## 2019-06-04 LAB — MAGNESIUM: Magnesium: 2 mg/dL (ref 1.7–2.4)

## 2019-06-04 MED ORDER — FOLIC ACID 1 MG PO TABS
1.0000 mg | ORAL_TABLET | Freq: Every day | ORAL | Status: DC
  Administered 2019-06-04 – 2019-06-07 (×4): 1 mg via ORAL
  Filled 2019-06-04 (×4): qty 1

## 2019-06-04 MED ORDER — AMOXICILLIN-POT CLAVULANATE 875-125 MG PO TABS
1.0000 | ORAL_TABLET | Freq: Two times a day (BID) | ORAL | Status: DC
  Administered 2019-06-05 – 2019-06-07 (×5): 1 via ORAL
  Filled 2019-06-04 (×5): qty 1

## 2019-06-04 MED ORDER — SACCHAROMYCES BOULARDII 250 MG PO CAPS
250.0000 mg | ORAL_CAPSULE | Freq: Two times a day (BID) | ORAL | Status: DC
  Administered 2019-06-05 – 2019-06-07 (×5): 250 mg via ORAL
  Filled 2019-06-04 (×5): qty 1

## 2019-06-04 MED ORDER — THIAMINE HCL 100 MG PO TABS
100.0000 mg | ORAL_TABLET | Freq: Every day | ORAL | Status: DC
  Administered 2019-06-04 – 2019-06-07 (×4): 100 mg via ORAL
  Filled 2019-06-04 (×5): qty 1

## 2019-06-04 NOTE — Consult Note (Addendum)
ORTHOPAEDIC CONSULTATION  REQUESTING PHYSICIAN: Kayleen Memos, DO  Chief Complaint: Left forearm infection  HPI: Matthew Hickman is a 86 y.o. male I am seeing at the request of Dr. Nevada Crane regarding a left forearm infection.  Patient currently has no complaints, says it does not hurt him, he apparently had an IV in the site relatively recently, he has extremely poor skin quality, secondary to long-term prednisone use.  He says his hand works fine, orthopedic hand surgery consulted because of abscess in the subcutaneous tissue seen on MRI.  Past Medical History:  Diagnosis Date  . Cancer ([facility])    skin  . Hypertension    Past Surgical History:  Procedure Laterality Date  . APPENDECTOMY    . ARTERY BIOPSY Bilateral 04/07/2019   Procedure: BIOPSY TEMPORAL ARTERY;  Surgeon: Jesusita Oka, MD;  Location: [facility];  Service: General;  Laterality: Bilateral;  . MELANOMA EXCISION Bilateral 01/30/2019   Procedure: WIDE LOCAL EXCISION WITH ADVANCEMENT FLAP CLOSURE OF MELANOMA LEFT NECK AND RIGHT BACK;  Surgeon: [name], MD;  Location: [facility];  Service: General;  Laterality: Bilateral;   Social History   Socioeconomic History  . Marital status: Married    Spouse name: Not on file  . Number of children: Not on file  . Years of education: Not on file  . Highest education level: Not on file  Occupational History  . Not on file  Tobacco Use  . Smoking status: Never Smoker  . Smokeless tobacco: Never Used  Substance and Sexual Activity  . Alcohol use: Yes    Comment: 1 glass of bourbon per night  . Drug use: Never  . Sexual activity: Not on file  Other Topics Concern  . Not on file  Social History Narrative  . Not on file   Social Determinants of Health   Financial Resource Strain:   . Difficulty of Paying Living Expenses:   Food Insecurity:   . Worried About Charity fundraiser in the Last Year:   . Arboriculturist in the Last Year:   Transportation Needs:   . Lexicographer (Medical):   Marland Kitchen Lack of Transportation (Non-Medical):   Physical Activity:   . Days of Exercise per Week:   . Minutes of Exercise per Session:   Stress:   . Feeling of Stress :   Social Connections:   . Frequency of Communication with Friends and Family:   . Frequency of Social Gatherings with Friends and Family:   . Attends Religious Services:   . Active Member of Clubs or Organizations:   . Attends Archivist Meetings:   Marland Kitchen Marital Status:    Family History  Problem Relation Age of Onset  . Heart attack Father        Died at age 75  . Heart failure Father    Allergies  Allergen Reactions  . Tape Other (See Comments)    PATIENT'S SKIN IS THIN AND IT TEARS EASILY     Positive ROS: All other systems have been reviewed and were otherwise negative with the exception of those mentioned in the HPI and as above.  Physical Exam: General: Alert, no acute distress, he is very hard of hearing. Cardiovascular: No pedal edema Respiratory: No cyanosis, no use of accessory musculature GI: No organomegaly, abdomen is soft and non-tender Skin: He has multiple skin tears, he has a serous draining skin tear along the right olecranon region, he has a small dorsal  fluctuant area on the dorsum of the left forearm, he has multiple other areas of extreme skin delicacy, with some skin tears really on both arms. Neurologic: Sensation intact distally Psychiatric: Patient is competent for consent with normal mood and affect Lymphatic: No axillary or cervical lymphadenopathy  MUSCULOSKELETAL: Small subcutaneous abscess on the dorsum of the left forearm.  All fingers flex extend and abduct.  Skin tears with serous drainage on the right elbow and left elbow.       Assessment: Active Problems:   A-fib ([facility])  Dorsal subcutaneous abscess, left forearm, secondary to previous IV site  Plan: This infection is significant, particularly in light of his poor skin and soft tissue  quality, however it does not seem to be tracking into the muscle, and is not tracking into the bone.  I recommended wound care, I have opened up the area of fluctuance simply by massaging it with a gauze, which expressed a mild amount of purulence.  I do not think this needs surgical debridement formally at this point, but would recommend daily dressing changes, with monitoring for progression.  The skin quality is so poor, getting this to heal is going to be fairly challenging, and he also has issues on both of his elbows.  I will follow along with you, and have decompressed the majority of the fluid collection simply by manual palpation.    Johnny Bridge, MD Cell [phone]   06/04/2019 4:16 PM

## 2019-06-04 NOTE — Progress Notes (Addendum)
PROGRESS NOTE  Matthew Hickman DZH:[id number] DOB: 1925/05/07 DOA: 05/29/2019 PCP: [name], MD  HPI/Recap of past 24 hours: Matthew Hickman is a 86 y.o. male with medical history significant of hypertension, recently diagnosed paroxysmal A. 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   ED Course: CT head shows chronic changes, and patient was found in A. fib with rapid ventricular rate.  Cardiology was consulted and [facility] asked to admit.  Received amiodarone drip and 3 doses of oral digoxin 0.25 mg every 2 hours.  Metoprolol dose was increased to 75 mg daily.  Had a TEE cardioversion on 06/02/2019.  Converted to sinus rhythm but with frequent PACs.  Digoxin was stopped.  Amiodarone drip switched to p.o. amiodarone.  On Eliquis for CVA prevention.  He was seen by hematology oncology due to concern for MDS.  No indication for bone marrow biopsy, will monitor for now.  Hospital course complicated by left forearm cellulitis with purulence.  Was started on IV vancomycin on 06/02/2019.  X-ray of forearm showed bone lucency, osteomyelitis was ruled out by MRI.  However showed small subcutaneous abscess likely draining to the skin surface.  Hand surgery, Dr. Mardelle Matte consulted and will see in consultation.  N.p.o. for possible washout/debridement.  06/04/19: Seen and examined.  He has no specific complaints at this time.  Left forearm with improved erythema, warmth, some tenderness with pus noted at the surface of the affected skin.   Assessment/Plan: Active Problems:   A-fib (HCC)  A. flutter with RVR post TEE guided cardioversion on 06/02/19 by Dr. Gilman Schmidt Post cardioversion, in sinus rhythm with sinus arrhythmia, PACs. Diltiazem drip stopped on 06/01/19 Currently on p.o. amiodarone, p.o. Lopressor 75 mg twice daily. Off digoxin Continue Eliquis for CVA prevention Last 2D echo on 04/05/19> normal LVEF 55 to 60% with grade 1  diastolic dysfunction. Potassium 4.3 and magnesium 2.0 at goal.  Left forearm purulent cellulitis, ruled out osteomyelitis by MRI. Osteomyelitis was ruled out by MRI.  However showed small subcutaneous abscess likely draining to the skin surface.  Hand surgery, Dr. Mardelle Matte consulted and will see in consultation. N.p.o. for possible washout/debridement. X-ray forearm shows lucency in the bone,  MRI as stated above was obtained to rule out osteomyelitis Continue to treat empirically with IV vancomycin due to chronic use of steroids with immunosuppression and purulence IV vancomycin day #3; once improved we will switch to oral antibiotics for total of 10-14 days  Resolved hypotensive, likely iatrogenic Blood pressure is at goal.  Pseudomonas aeruginosa UTI Presented with pyuria Urine culture growing 60,000 colonies of Pseudomonas aeruginosa, pansensitive.  Received 3 days of Rocephin. Switch to Cipro x5 days, day number 4 out of 5.  Acute thrombocytopenia/macrocytic anemia likely secondary to bone marrow suppression in the setting of acute illness. Platelets 83K>> 71K>> 73K> 79K> 87K Hemoglobin 9.4 with MCV 99.6 Seen by hematology on 06/03/2019, no indication for bone marrow biopsy. Will monitor for now.  Giant cell arteritis Continue prednisone home regimen Continue PPI for GI prophylaxis  Moderate pulmonary hypertension Euvolemic on exam Management per cardiology's recommendation  BPH Continue Terazosin.   Monitor urine output No evidence of acute renal failure.  Physical debility PT OT assessment recommended SNF TOC consulted to assist with SNF placement. Continue PT OT with assistance and fall precautions.  History of alcohol use disorder No evidence of alcohol withdrawal at the time of this evaluation Continue multivitamins, thiamine and folate  DVT prophylaxis:  Eliquis Code Status: Full code Family Communication:  Will call family if okay with the  patient.  Disposition Plan:  Patient is from home.  Anticipate discharge to home in the next 48 hours or when cardiology and hand surgery sign off.  Barrier to discharge: Ongoing management for A. fib/A-flutter, and left upper extremity purulent cellulitis.    Consults called: cardiology, hematology oncology, hand surgery.       Objective: Vitals:   06/03/19 2340 06/04/19 0342 06/04/19 0800 06/04/19 1100  BP: (!) 98/53 120/66 121/66 (!) 107/59  Pulse: (!) 101 88 89 71  Resp: 19   20  Temp: 97.6 F (36.4 C) 97.7 F (36.5 C)  98.5 F (36.9 C)  TempSrc: Oral Oral  Oral  SpO2: 95% 94% 95% 97%  Weight:  57.6 kg    Height:        Intake/Output Summary (Last 24 hours) at 06/04/2019 1406 Last data filed at 06/04/2019 1356 Gross per 24 hour  Intake 1998 ml  Output 875 ml  Net 1123 ml   Filed Weights   06/02/19 0418 06/03/19 0541 06/04/19 0342  Weight: 56.5 kg 55.9 kg 57.6 kg    Exam:  . General: 86 y.o. year-old male well-developed well-nourished in no acute distress.  Alert and oriented times x3.  Very hard of hearing.   . Cardiovascular: Irregular rate and rhythm no rubs or gallops.   Marland Kitchen Respiratory: Clear to auscultation no wheezes or rales.   . Abdomen: Soft nontender normal bowel sounds present.   . Musculoskeletal: Left forearm with erythema, warmth and some tenderness, purulence noted at the surface of the affected skin. Marland Kitchen Psychiatry: Mood is appropriate for condition and setting.   Data Reviewed: CBC: Recent Labs  Lab 05/31/19 0810 06/01/19 0248 06/02/19 0210 06/03/19 0155 06/04/19 0244  WBC 11.9* 9.6 7.6 6.6 6.7  HGB 10.4* 9.2* 9.6* 9.4* 9.3*  HCT 32.1* 27.7* 28.0* 27.8* 28.5*  MCV 101.6* 99.3 100.0 99.6 100.7*  PLT 83* 71* 73* 79* 87*   Basic Metabolic Panel: Recent Labs  Lab 05/30/19 0341 05/30/19 0341 05/31/19 0235 06/01/19 0248 06/02/19 0210 06/03/19 0155 06/04/19 0538  NA 140   < > 138 137 137 140 140  K 4.2   < > 4.3 4.3 4.2 4.2 4.3  CL  102   < > 100 101 101 104 106  CO2 27   < > 28 27 28 27 26   GLUCOSE 166*   < > 174* 201* 184* 175* 152*  BUN 34*   < > 39* 40* 40* 38* 37*  CREATININE 0.84   < > 0.84 0.77 0.86 0.73 0.83  CALCIUM 8.6*   < > 8.6* 8.7* 8.5* 8.7* 8.6*  MG 1.9  --   --   --   --   --  2.0   < > = values in this interval not displayed.   GFR: Estimated Creatinine Clearance: 47.2 mL/min (by C-G formula based on SCr of 0.83 mg/dL). Liver Function Tests: No results for input(s): AST, ALT, ALKPHOS, BILITOT, PROT, ALBUMIN in the last 168 hours. No results for input(s): LIPASE, AMYLASE in the last 168 hours. No results for input(s): AMMONIA in the last 168 hours. Coagulation Profile: No results for input(s): INR, PROTIME in the last 168 hours. Cardiac Enzymes: No results for input(s): CKTOTAL, CKMB, CKMBINDEX, TROPONINI in the last 168 hours. BNP (last 3 results) No results for input(s): PROBNP in the last 8760 hours. HbA1C: No results for  input(s): HGBA1C in the last 72 hours. CBG: Recent Labs  Lab 05/29/19 1918  GLUCAP 218*   Lipid Profile: No results for input(s): CHOL, HDL, LDLCALC, TRIG, CHOLHDL, LDLDIRECT in the last 72 hours. Thyroid Function Tests: No results for input(s): TSH, T4TOTAL, FREET4, T3FREE, THYROIDAB in the last 72 hours. Anemia Panel: No results for input(s): VITAMINB12, FOLATE, FERRITIN, TIBC, IRON, RETICCTPCT in the last 72 hours. Urine analysis:    Component Value Date/Time   COLORURINE YELLOW 05/29/2019 1211   APPEARANCEUR HAZY (A) 05/29/2019 1211   LABSPEC 1.015 05/29/2019 1211   PHURINE 7.0 05/29/2019 1211   GLUCOSEU NEGATIVE 05/29/2019 1211   HGBUR SMALL (A) 05/29/2019 1211   BILIRUBINUR NEGATIVE 05/29/2019 1211   KETONESUR NEGATIVE 05/29/2019 1211   PROTEINUR 30 (A) 05/29/2019 1211   UROBILINOGEN 1.0 09/20/2010 1226   NITRITE NEGATIVE 05/29/2019 1211   LEUKOCYTESUR LARGE (A) 05/29/2019 1211   Sepsis Labs: '@LABRCNTIP'$ (procalcitonin:4,lacticidven:4)  ) Recent  Results (from the past 240 hour(s))  Respiratory Panel by RT PCR (Flu A&B, Covid) - Nasopharyngeal Swab     Status: None   Collection Time: 05/29/19  1:58 PM   Specimen: Nasopharyngeal Swab  Result Value Ref Range Status   SARS Coronavirus 2 by RT PCR NEGATIVE NEGATIVE Final    Comment: (NOTE) SARS-CoV-2 target nucleic acids are NOT DETECTED. The SARS-CoV-2 RNA is generally detectable in upper respiratoy specimens during the acute phase of infection. The lowest concentration of SARS-CoV-2 viral copies this assay can detect is 131 copies/mL. A negative result does not preclude SARS-Cov-2 infection and should not be used as the sole basis for treatment or other patient management decisions. A negative result may occur with  improper specimen collection/handling, submission of specimen other than nasopharyngeal swab, presence of viral mutation(s) within the areas targeted by this assay, and inadequate number of viral copies (<131 copies/mL). A negative result must be combined with clinical observations, patient history, and epidemiological information. The expected result is Negative. Fact Sheet for Patients:  PinkCheek.be Fact Sheet for Healthcare Providers:  GravelBags.it This test is not yet ap proved or cleared by the [place] FDA and  has been authorized for detection and/or diagnosis of SARS-CoV-2 by FDA under an Emergency Use Authorization (EUA). This EUA will remain  in effect (meaning this test can be used) for the duration of the COVID-19 declaration under Section 564(b)(1) of the Act, 21 U.S.C. section 360bbb-3(b)(1), unless the authorization is terminated or revoked sooner.    Influenza A by PCR NEGATIVE NEGATIVE Final   Influenza B by PCR NEGATIVE NEGATIVE Final    Comment: (NOTE) The Xpert Xpress SARS-CoV-2/FLU/RSV assay is intended as an aid in  the diagnosis of influenza from Nasopharyngeal swab specimens  and  should not be used as a sole basis for treatment. Nasal washings and  aspirates are unacceptable for Xpert Xpress SARS-CoV-2/FLU/RSV  testing. Fact Sheet for Patients: PinkCheek.be Fact Sheet for Healthcare Providers: GravelBags.it This test is not yet approved or cleared by the [place] FDA and  has been authorized for detection and/or diagnosis of SARS-CoV-2 by  FDA under an Emergency Use Authorization (EUA). This EUA will remain  in effect (meaning this test can be used) for the duration of the  Covid-19 declaration under Section 564(b)(1) of the Act, 21  U.S.C. section 360bbb-3(b)(1), unless the authorization is  terminated or revoked. Performed at [facility], [place] [address], [place], [place] [address]   MRSA PCR Screening     Status: None  Collection Time: 05/30/19  5:43 AM   Specimen: Nasal Mucosa; Nasopharyngeal  Result Value Ref Range Status   MRSA by PCR NEGATIVE NEGATIVE Final    Comment:        The GeneXpert MRSA Assay (FDA approved for NASAL specimens only), is one component of a comprehensive MRSA colonization surveillance program. It is not intended to diagnose MRSA infection nor to guide or monitor treatment for MRSA infections. Performed at [facility], [place] [address] [place], [place] [address]   Culture, Urine     Status: Abnormal   Collection Time: 05/31/19  6:23 AM   Specimen: Urine, Random  Result Value Ref Range Status   Specimen Description URINE, RANDOM  Final   Special Requests   Final    NONE Performed at [facility], [place] [address] [place], [place] [address]    Culture 60,000 COLONIES/mL PSEUDOMONAS AERUGINOSA (A)  Final   Report Status 06/02/2019 FINAL  Final   Organism ID, Bacteria PSEUDOMONAS AERUGINOSA (A)  Final      Susceptibility   Pseudomonas aeruginosa - MIC*    CEFTAZIDIME 2 SENSITIVE Sensitive     CIPROFLOXACIN <=0.25 SENSITIVE Sensitive      GENTAMICIN 2 SENSITIVE Sensitive     IMIPENEM 2 SENSITIVE Sensitive     PIP/TAZO <=4 SENSITIVE Sensitive     CEFEPIME 4 SENSITIVE Sensitive     * 60,000 COLONIES/mL PSEUDOMONAS AERUGINOSA      Studies: MR FOREARM LEFT W WO CONTRAST  Result Date: 06/03/2019 CLINICAL DATA:  Mid radial lesion on radiography, for further characterization. EXAM: MRI OF THE LEFT FOREARM WITHOUT AND WITH CONTRAST TECHNIQUE: Multiplanar, multisequence MR imaging of the left forearm was performed before and after the administration of intravenous contrast. CONTRAST:  5.59m GADAVIST GADOBUTROL 1 MMOL/ML IV SOLN COMPARISON:  Radiographs from 06/02/2019 FINDINGS: Bones/Joint/Cartilage The small lucency in the radius has fatty signal in represents subtle endosteal scalloping possibly at a nutrient foramen. There is no abnormal enhancement in the bone or other specific worrisome bony characteristics, and the appearance is considered benign/incidental. No osteomyelitis is observed. Ligaments N/A Muscles and Tendons Field heterogeneity around the elbow and wrist which reduces sensitivity in assessing for edema. No intramuscular abscess is identified. Soft tissues Along the dorsal superficial fascial margin and subcutaneous tissues of the distal forearm, and in the marked location, there is abnormal enhancement around a irregular 3.5 by 0.8 by 4.0 cm (volume = 6 cm^3) fluid collection in the subcutaneous tissues which appears to be draining to the cutaneous surface on image 40/13. The appearance suggests a small subcutaneous abscess draining to the skin surface. Uncertain patency of a small overlying superficial vein. IMPRESSION: 1. Small subcutaneous abscess along the dorsal superficial fascial margin of the distal forearm and in the marked location. This appears to likely be draining to the skin surface. No underlying osteomyelitis or intramuscular abscess. 2. Small lucency in the radius is considered benign/incidental, and  represents a small incidental focus of endosteal scalloping. 3. Field heterogeneity around the elbow and wrist reduces sensitivity in assessing for in these regions. Electronically Signed   By: WVan ClinesM.D.   On: 06/03/2019 18:51    Scheduled Meds: . amiodarone  400 mg Oral BID  . apixaban  2.5 mg Oral BID  . ciprofloxacin  500 mg Oral BID  . feeding supplement (ENSURE ENLIVE)  237 mL Oral TID BM  . metoprolol tartrate  75 mg Oral BID  . multivitamin with minerals  1 tablet Oral Daily  . pantoprazole  20 mg Oral Daily  . predniSONE  30 mg Oral BID WC  . sodium chloride flush  3 mL Intravenous Q12H  . terazosin  1 mg Oral QHS  . traZODone  50 mg Oral Once    Continuous Infusions: . sodium chloride    . diltiazem (CARDIZEM) infusion Stopped (06/01/19 2345)  . vancomycin 750 mg (06/04/19 1218)     LOS: 6 days     Kayleen Memos, MD [facility] Pager [phone]  If 7PM-7AM, please contact night-coverage [email or web address] Password [email or web address] 06/04/2019, 2:06 PM

## 2019-06-04 NOTE — Progress Notes (Signed)
Progress Note  Patient Name: Matthew Hickman Date of Encounter: 06/04/2019  Primary Cardiologist: Sherren Mocha, MD   Patient Profile     86 y.o. male with hypertension, giant cell arteritis with visual involvement, presenting with atypical atrial flutter with rapid ventricular response, difficult to rate control, moderate thrombocytopenia without bleeding complications anticoagulation with Apixoban  Rate control with metop and dig>> amiodarone   Echo EF 3/21 >> EF 55-60%  Pulm Htn Mild  Subjective   Denies sob or chest pain, tolerating amiodarone  Inpatient Medications    Scheduled Meds: . amiodarone  400 mg Oral BID  . apixaban  2.5 mg Oral BID  . ciprofloxacin  500 mg Oral BID  . feeding supplement (ENSURE ENLIVE)  237 mL Oral TID BM  . metoprolol tartrate  75 mg Oral BID  . multivitamin with minerals  1 tablet Oral Daily  . pantoprazole  20 mg Oral Daily  . predniSONE  30 mg Oral BID WC  . sodium chloride flush  3 mL Intravenous Q12H  . terazosin  1 mg Oral QHS  . traZODone  50 mg Oral Once   Continuous Infusions: . sodium chloride    . diltiazem (CARDIZEM) infusion Stopped (06/01/19 2345)  . vancomycin 750 mg (06/04/19 1218)   PRN Meds: sodium chloride, acetaminophen, ondansetron (ZOFRAN) IV, sodium chloride flush   Vital Signs    Vitals:   06/03/19 2340 06/04/19 0342 06/04/19 0800 06/04/19 1100  BP: (!) 98/53 120/66 121/66 (!) 107/59  Pulse: (!) 101 88 89 71  Resp: 19   20  Temp: 97.6 F (36.4 C) 97.7 F (36.5 C)  98.5 F (36.9 C)  TempSrc: Oral Oral  Oral  SpO2: 95% 94% 95% 97%  Weight:  57.6 kg    Height:        Intake/Output Summary (Last 24 hours) at 06/04/2019 1304 Last data filed at 06/04/2019 1200 Gross per 24 hour  Intake 1728 ml  Output 775 ml  Net 953 ml   Last 3 Weights 06/04/2019 06/03/2019 06/02/2019  Weight (lbs) 126 lb 15.8 oz 123 lb 3.8 oz 124 lb 9 oz  Weight (kg) 57.6 kg 55.9 kg 56.5 kg      Telemetry    freq nonsustained atrial  tachycardia-- no afib   ECG    Sinus rhythm with very frequent PACs, unusual "spiky" P wave morphology, nonspecific T wave flattening - agree with interpretation although Pwaves are all relatively similar   Physical Exam  Well developed and nourished in no acute distress HENT normal Neck supple with JVP-flat Carotids brisk and full without bruits Clear Irregularly irregular rate and rhythm with controlled ventricular response, no murmurs or gallops Abd-soft with active BS without hepatomegaly No Clubbing cyanosis edema Skin-warm and dry A & Oriented  Grossly normal sensory and motor function     Labs    High Sensitivity Troponin:  No results for input(s): TROPONINIHS in the last 720 hours.    Chemistry Recent Labs  Lab 06/02/19 0210 06/03/19 0155 06/04/19 0538  NA 137 140 140  K 4.2 4.2 4.3  CL 101 104 106  CO2 28 27 26   GLUCOSE 184* 175* 152*  BUN 40* 38* 37*  CREATININE 0.86 0.73 0.83  CALCIUM 8.5* 8.7* 8.6*  GFRNONAA >60 >60 >60  GFRAA >60 >60 >60  ANIONGAP 8 9 8      Hematology Recent Labs  Lab 06/02/19 0210 06/03/19 0155 06/04/19 0244  WBC 7.6 6.6 6.7  RBC 2.80* 2.79*  2.83*  HGB 9.6* 9.4* 9.3*  HCT 28.0* 27.8* 28.5*  MCV 100.0 99.6 100.7*  MCH 34.3* 33.7 32.9  MCHC 34.3 33.8 32.6  RDW 19.9* 20.0* 20.3*  PLT 73* 79* 87*    BNPNo results for input(s): BNP, PROBNP in the last 168 hours.   DDimer No results for input(s): DDIMER in the last 168 hours.   Radiology    MR FOREARM LEFT W WO CONTRAST  Result Date: 06/03/2019 CLINICAL DATA:  Mid radial lesion on radiography, for further characterization. EXAM: MRI OF THE LEFT FOREARM WITHOUT AND WITH CONTRAST TECHNIQUE: Multiplanar, multisequence MR imaging of the left forearm was performed before and after the administration of intravenous contrast. CONTRAST:  5.50mL GADAVIST GADOBUTROL 1 MMOL/ML IV SOLN COMPARISON:  Radiographs from 06/02/2019 FINDINGS: Bones/Joint/Cartilage The small lucency in the radius  has fatty signal in represents subtle endosteal scalloping possibly at a nutrient foramen. There is no abnormal enhancement in the bone or other specific worrisome bony characteristics, and the appearance is considered benign/incidental. No osteomyelitis is observed. Ligaments N/A Muscles and Tendons Field heterogeneity around the elbow and wrist which reduces sensitivity in assessing for edema. No intramuscular abscess is identified. Soft tissues Along the dorsal superficial fascial margin and subcutaneous tissues of the distal forearm, and in the marked location, there is abnormal enhancement around a irregular 3.5 by 0.8 by 4.0 cm (volume = 6 cm^3) fluid collection in the subcutaneous tissues which appears to be draining to the cutaneous surface on image 40/13. The appearance suggests a small subcutaneous abscess draining to the skin surface. Uncertain patency of a small overlying superficial vein. IMPRESSION: 1. Small subcutaneous abscess along the dorsal superficial fascial margin of the distal forearm and in the marked location. This appears to likely be draining to the skin surface. No underlying osteomyelitis or intramuscular abscess. 2. Small lucency in the radius is considered benign/incidental, and represents a small incidental focus of endosteal scalloping. 3. Field heterogeneity around the elbow and wrist reduces sensitivity in assessing for in these regions. Electronically Signed   By: Van Clines M.D.   On: 06/03/2019 18:51    Cardiac Studies   Echo 04/05/19  1. Left ventricular ejection fraction, by estimation, is 55 to 60%. The  left ventricle has normal function. The left ventricle has no regional  wall motion abnormalities. Left ventricular diastolic parameters are  consistent with Grade I diastolic  dysfunction (impaired relaxation).  2. Right ventricular systolic function is normal. The right ventricular  size is normal. There is moderately elevated pulmonary artery systolic    pressure. The estimated right ventricular systolic pressure is [id number] mmHg.  3. The mitral valve is normal in structure and function. Trivial mitral  valve regurgitation. No evidence of mitral stenosis.  4. The aortic valve is tricuspid. Aortic valve regurgitation is not  visualized. No aortic stenosis is present.  5. Aortic dilatation noted. There is mild dilatation of the ascending  aorta measuring 40 mm.  6. The inferior vena cava is dilated in size with <50% respiratory  variability, suggesting right atrial pressure of 15 mmHg.    Assessment & Plan    Atrial flutter Atypical s/p DCCV  High Risk Medication Surveillance Amiodarone  Giant cell arteritis  Thrombocytopenia/anemia    heme consult>> " do not feel strongly .Marland Kitchen.represent(s) bone marrow malignancy"    Continue amiodarone for rhythm-- surveillance labs normal 3/21 Will increase 400 daily >> bid and continue metoprolol  Ambulate slowly      For questions or  updates, please contact [place] Please consult [email or web address] for contact info under        Signed, Virl Axe, MD  06/04/2019, 1:04 PM

## 2019-06-05 DIAGNOSIS — I4892 Unspecified atrial flutter: Secondary | ICD-10-CM | POA: Diagnosis not present

## 2019-06-05 DIAGNOSIS — I4891 Unspecified atrial fibrillation: Secondary | ICD-10-CM | POA: Diagnosis not present

## 2019-06-05 LAB — CBC
HCT: 28.6 % — ABNORMAL LOW (ref 39.0–52.0)
Hemoglobin: 9.3 g/dL — ABNORMAL LOW (ref 13.0–17.0)
MCH: 33 pg (ref 26.0–34.0)
MCHC: 32.5 g/dL (ref 30.0–36.0)
MCV: 101.4 fL — ABNORMAL HIGH (ref 80.0–100.0)
Platelets: 96 10*3/uL — ABNORMAL LOW (ref 150–400)
RBC: 2.82 MIL/uL — ABNORMAL LOW (ref 4.22–5.81)
RDW: 20.4 % — ABNORMAL HIGH (ref 11.5–15.5)
WBC: 6.4 10*3/uL (ref 4.0–10.5)
nRBC: 0.3 % — ABNORMAL HIGH (ref 0.0–0.2)

## 2019-06-05 LAB — BASIC METABOLIC PANEL
Anion gap: 8 (ref 5–15)
BUN: 37 mg/dL — ABNORMAL HIGH (ref 8–23)
CO2: 28 mmol/L (ref 22–32)
Calcium: 8.6 mg/dL — ABNORMAL LOW (ref 8.9–10.3)
Chloride: 105 mmol/L (ref 98–111)
Creatinine, Ser: 0.84 mg/dL (ref 0.61–1.24)
GFR calc Af Amer: >60 mL/min (ref >60)
GFR calc non Af Amer: >60 mL/min (ref >60)
Glucose, Bld: 174 mg/dL — ABNORMAL HIGH (ref 70–99)
Potassium: 4.5 mmol/L (ref 3.5–5.1)
Sodium: 141 mmol/L (ref 135–145)

## 2019-06-05 NOTE — Plan of Care (Signed)

## 2019-06-05 NOTE — TOC Initial Note (Signed)
Transition of Care [facility]) - Initial/Assessment Note    Patient Details  Name: Matthew Hickman MRN: [id number] Date of Birth: 1925-04-17  Transition of [facility]) CM/SW Contact:    Matthew Sam, LCSW Phone Number: 06/05/2019, 11:22 AM  Clinical Narrative:                  CSW met with patient at bedside, introduced role and informed patient of SNF recommendation at time of discharge. Patient is agreeable to go to short term rehab before going home, reports preference for [facility] as his granddaughter lives across the street and his wife has been there in the past.   CSW has faxed referral, pending bed offer at this time.  Expected Discharge Plan: Skilled Nursing Facility Barriers to Discharge: Continued Medical Work up   Patient Goals and CMS Choice Patient states their goals for this hospitalization and ongoing recovery are:: to go to short term rehab then home CMS Medicare.gov Compare Post Acute Care list provided to:: Patient Choice offered to / list presented to : Patient  Expected Discharge Plan and Services Expected Discharge Plan: [place] Choice: [place] arrangements for the past 2 months: Single Family Home                                      Prior Living Arrangements/Services Living arrangements for the past 2 months: Single Family Home Lives with:: Self Patient language and need for interpreter reviewed:: Yes Do you feel safe going back to the place where you live?: No   needs short term rehab  Need for Family Participation in Patient Care: Yes (Comment) Care giver support system in place?: Yes (comment)   Criminal Activity/Legal Involvement Pertinent to Current Situation/Hospitalization: No - Comment as needed  Activities of Daily Living Home Assistive Devices/Equipment: Walker (specify type) ADL Screening (condition at time of admission) Patient's cognitive ability adequate to  safely complete daily activities?: Yes Is the patient deaf or have difficulty hearing?: Yes Does the patient have difficulty seeing, even when wearing glasses/contacts?: No Does the patient have difficulty concentrating, remembering, or making decisions?: No Patient able to express need for assistance with ADLs?: Yes Does the patient have difficulty dressing or bathing?: Yes Independently performs ADLs?: No Communication: Independent Dressing (OT): Independent Grooming: Needs assistance Is this a change from baseline?: Change from baseline, expected to last <3 days Feeding: Independent Bathing: Needs assistance Is this a change from baseline?: Change from baseline, expected to last <3 days Toileting: Independent(urinal) In/Out Bed: Needs assistance Is this a change from baseline?: Change from baseline, expected to last <3 days Walks in Home: Independent with device (comment) Does the patient have difficulty walking or climbing stairs?: Yes Weakness of Legs: Both Weakness of Arms/Hands: None  Permission Sought/Granted Permission sought to share information with : Case Manager, Customer service manager, Family Supports Permission granted to share information with : Yes, Verbal Permission Granted  Share Information with NAME: Matthew Hickman  Permission granted to share info w AGENCY: SNFs  Permission granted to share info w Relationship: niece  Permission granted to share info w Contact Information: [phone]  Emotional Assessment Appearance:: Appears stated age Attitude/Demeanor/Rapport: Gracious Affect (typically observed): Calm Orientation: : Oriented to Self, Oriented to Place, Oriented to  Time, Oriented to Situation Alcohol / Substance Use: Not Applicable Psych Involvement: No (comment)  Admission diagnosis:  A-fib [facility]) [I48.91] Fall [W19.XXXA] Atrial flutter with rapid ventricular response [facility]) [I48.92] Patient Active Problem List   Diagnosis Date Noted  . Atrial  flutter with rapid ventricular response ([place])   . Fall   . Visual changes 04/10/2019  . Tachycardia 04/10/2019  . Normocytic anemia 04/10/2019  . Melanoma ([place]) 04/10/2019  . Penile bleeding 04/10/2019  . Protein-calorie malnutrition, severe 04/06/2019  . A-fib ([place]) 04/04/2019  . Giant cell arteritis ([place]) 04/04/2019  . Benign essential HTN 04/04/2019   PCP:  Matthew Gravel, MD Pharmacy:   [facility] [place] [address] Phone: [phone] Fax: [phone]  [facility] [place], [place] [address] [place] [address] Phone: [phone] Fax: [phone]     Social Determinants of Health (SDOH) Interventions    Readmission Risk Interventions No flowsheet data found.

## 2019-06-05 NOTE — Progress Notes (Signed)
PROGRESS NOTE  Matthew Hickman KDX:[id number] DOB: [birth year] DOA: 05/29/2019 PCP: [name], MD  HPI/Recap of past 24 hours: Matthew Hickman is a 86 y.o. male with medical history significant of hypertension, recently diagnosed paroxysmal A. 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   ED Course: CT head nonacute, in A. fib with rapid ventricular rate.  Cardiology was consulted and TRH was asked to admit.  Initially received amiodarone drip and 3 doses of oral digoxin 0.25 mg every 2 hours.  Metoprolol dose was increased to 75 mg daily.  Had a TEE directed cardioversion on 06/02/2019.  Converted to sinus rhythm but with frequent PACs.  Digoxin was stopped.  Amiodarone drip switched to p.o. amiodarone.  On Eliquis for CVA prevention.  He was seen by hematology oncology due to concern for MDS.  No indication for bone marrow biopsy, monitoring for now.  Hospital course complicated by left forearm cellulitis with purulence.  Was started on IV vancomycin on 06/02/2019, DC'd on 06/04/2019 and switched to Augmentin after assessment by hand surgery Dr. Mardelle Matte.  X-ray of forearm showed bone lucency, osteomyelitis was ruled out by MRI.  Showed small subcutaneous abscess likely draining to the skin surface.  Recommendations for wound care with daily dressing changes and monitoring for progression.   06/05/19: Seen and examined.  He has no complaints this morning.  He denies any chest pain, palpitations or dyspnea.  Seen by cardiology, heart rate fluctuating.   Assessment/Plan: Active Problems:   A-fib (HCC)  A. flutter with RVR post TEE guided cardioversion on 06/02/19 by Dr. Gilman Schmidt Post cardioversion, in sinus rhythm with sinus arrhythmia, PACs. Diltiazem drip stopped on 06/01/19 Currently on p.o. amiodarone 400 mg twice daily, p.o. Lopressor 75 mg twice daily. Off digoxin Continue Eliquis for CVA prevention Last 2D echo  on 04/05/19> normal LVEF 55 to 60% with grade 1 diastolic dysfunction. Potassium 4.5 and magnesium 2.0 at goal.  Left forearm purulent cellulitis, ruled out osteomyelitis by MRI. Osteomyelitis was ruled out by MRI.  However showed small subcutaneous abscess likely draining to the skin surface.  Seen by Hand surgery, Dr. Mardelle Matte recommends wound care and daily dressing changes Completed 3 days IV vancomycin then switched to Augmentin 5/2;  Resolved hypotensive, likely iatrogenic Blood pressure is stable Maintain MAP greater than 65  Pseudomonas aeruginosa UTI Presented with pyuria Urine culture growing 60,000 colonies of Pseudomonas aeruginosa, pansensitive.  Received 3 days of Rocephin. Switch to Cipro x5 days, day number 5 out of 5.  Improving acute thrombocytopenia/macrocytic anemia likely secondary to bone marrow suppression in the setting of acute illness. Platelets 96 K Hemoglobin 9.3 with MCV 101 Seen by hematology on 06/03/2019, no indication for bone marrow biopsy, less likely MDS. Will monitor for now.  Giant cell arteritis Continue prednisone, home regimen Continue PPI for GI prophylaxis  Moderate pulmonary hypertension Euvolemic on exam Management per cardiology's recommendation  BPH Continue Terazosin.   Monitor urine output No evidence of acute renal failure.  Physical debility PT OT assessment recommended SNF TOC consulted to assist with SNF placement. Continue PT OT with assistance and fall precautions.  History of alcohol use disorder No evidence of alcohol withdrawal at the time of this evaluation Continue multivitamins, thiamine and folate  DVT prophylaxis:  Eliquis Code Status: Full code Family Communication:  Will call family if okay with the patient.  Disposition Plan:  Patient is from home.  Anticipate discharge to  home in the next 48 hours or when cardiology signs off.  Barrier to discharge: Ongoing management for A. fib/A-flutter Consults called:  cardiology, hematology oncology, hand surgery.       Objective: Vitals:   06/04/19 1934 06/04/19 2100 06/05/19 0000 06/05/19 0332  BP: 115/61 124/68 (!) 101/56 115/76  Pulse: 86 77 63 93  Resp: 19  (!) 23 18  Temp: 97.8 F (36.6 C)  97.6 F (36.4 C) 97.7 F (36.5 C)  TempSrc: Oral  Oral Oral  SpO2: 90%  98% 93%  Weight:    56 kg  Height:        Intake/Output Summary (Last 24 hours) at 06/05/2019 1025 Last data filed at 06/05/2019 1950 Gross per 24 hour  Intake 1000 ml  Output 625 ml  Net 375 ml   Filed Weights   06/03/19 0541 06/04/19 0342 06/05/19 0332  Weight: 55.9 kg 57.6 kg 56 kg    Exam:  . General: 86 y.o. year-old male hard of hearing.  Well-developed well-nourished in no acute distress. . Cardiovascular: Irregular rate and rhythm no rubs or gallops.   Marland Kitchen Respiratory: Clear to auscultation with no wheezes or rales. . Abdomen: Soft nontender normal bowel sounds present.   . Musculoskeletal: Left forearm with dressing with mild drainage.   Marland Kitchen Psychiatry: Mood is appropriate for condition and setting.  Data Reviewed: CBC: Recent Labs  Lab 06/01/19 0248 06/02/19 0210 06/03/19 0155 06/04/19 0244 06/05/19 0245  WBC 9.6 7.6 6.6 6.7 6.4  HGB 9.2* 9.6* 9.4* 9.3* 9.3*  HCT 27.7* 28.0* 27.8* 28.5* 28.6*  MCV 99.3 100.0 99.6 100.7* 101.4*  PLT 71* 73* 79* 87* 96*   Basic Metabolic Panel: Recent Labs  Lab 05/30/19 0341 05/31/19 0235 06/01/19 0248 06/02/19 0210 06/03/19 0155 06/04/19 0538 06/05/19 0245  NA 140   < > 137 137 140 140 141  K 4.2   < > 4.3 4.2 4.2 4.3 4.5  CL 102   < > 101 101 104 106 105  CO2 27   < > 27 28 27 26 28   GLUCOSE 166*   < > 201* 184* 175* 152* 174*  BUN 34*   < > 40* 40* 38* 37* 37*  CREATININE 0.84   < > 0.77 0.86 0.73 0.83 0.84  CALCIUM 8.6*   < > 8.7* 8.5* 8.7* 8.6* 8.6*  MG 1.9  --   --   --   --  2.0  --    < > = values in this interval not displayed.   GFR: Estimated Creatinine Clearance: 45.4 mL/min (by C-G formula  based on SCr of 0.84 mg/dL). Liver Function Tests: No results for input(s): AST, ALT, ALKPHOS, BILITOT, PROT, ALBUMIN in the last 168 hours. No results for input(s): LIPASE, AMYLASE in the last 168 hours. No results for input(s): AMMONIA in the last 168 hours. Coagulation Profile: No results for input(s): INR, PROTIME in the last 168 hours. Cardiac Enzymes: No results for input(s): CKTOTAL, CKMB, CKMBINDEX, TROPONINI in the last 168 hours. BNP (last 3 results) No results for input(s): PROBNP in the last 8760 hours. HbA1C: No results for input(s): HGBA1C in the last 72 hours. CBG: Recent Labs  Lab 05/29/19 1918  GLUCAP 218*   Lipid Profile: No results for input(s): CHOL, HDL, LDLCALC, TRIG, CHOLHDL, LDLDIRECT in the last 72 hours. Thyroid Function Tests: No results for input(s): TSH, T4TOTAL, FREET4, T3FREE, THYROIDAB in the last 72 hours. Anemia Panel: No results for input(s): VITAMINB12, FOLATE, FERRITIN, TIBC,  IRON, RETICCTPCT in the last 72 hours. Urine analysis:    Component Value Date/Time   COLORURINE YELLOW 05/29/2019 1211   APPEARANCEUR HAZY (A) 05/29/2019 1211   LABSPEC 1.015 05/29/2019 1211   PHURINE 7.0 05/29/2019 1211   GLUCOSEU NEGATIVE 05/29/2019 1211   HGBUR SMALL (A) 05/29/2019 1211   BILIRUBINUR NEGATIVE 05/29/2019 1211   KETONESUR NEGATIVE 05/29/2019 1211   PROTEINUR 30 (A) 05/29/2019 1211   UROBILINOGEN 1.0 09/20/2010 1226   NITRITE NEGATIVE 05/29/2019 1211   LEUKOCYTESUR LARGE (A) 05/29/2019 1211   Sepsis Labs: '@LABRCNTIP'$ (procalcitonin:4,lacticidven:4)  ) Recent Results (from the past 240 hour(s))  Respiratory Panel by RT PCR (Flu A&B, Covid) - Nasopharyngeal Swab     Status: None   Collection Time: 05/29/19  1:58 PM   Specimen: Nasopharyngeal Swab  Result Value Ref Range Status   SARS Coronavirus 2 by RT PCR NEGATIVE NEGATIVE Final    Comment: (NOTE) SARS-CoV-2 target nucleic acids are NOT DETECTED. The SARS-CoV-2 RNA is generally detectable  in upper respiratoy specimens during the acute phase of infection. The lowest concentration of SARS-CoV-2 viral copies this assay can detect is 131 copies/mL. A negative result does not preclude SARS-Cov-2 infection and should not be used as the sole basis for treatment or other patient management decisions. A negative result may occur with  improper specimen collection/handling, submission of specimen other than nasopharyngeal swab, presence of viral mutation(s) within the areas targeted by this assay, and inadequate number of viral copies (<131 copies/mL). A negative result must be combined with clinical observations, patient history, and epidemiological information. The expected result is Negative. Fact Sheet for Patients:  PinkCheek.be Fact Sheet for Healthcare Providers:  GravelBags.it This test is not yet ap proved or cleared by the [place] FDA and  has been authorized for detection and/or diagnosis of SARS-CoV-2 by FDA under an Emergency Use Authorization (EUA). This EUA will remain  in effect (meaning this test can be used) for the duration of the COVID-19 declaration under Section 564(b)(1) of the Act, 21 U.S.C. section 360bbb-3(b)(1), unless the authorization is terminated or revoked sooner.    Influenza A by PCR NEGATIVE NEGATIVE Final   Influenza B by PCR NEGATIVE NEGATIVE Final    Comment: (NOTE) The Xpert Xpress SARS-CoV-2/FLU/RSV assay is intended as an aid in  the diagnosis of influenza from Nasopharyngeal swab specimens and  should not be used as a sole basis for treatment. Nasal washings and  aspirates are unacceptable for Xpert Xpress SARS-CoV-2/FLU/RSV  testing. Fact Sheet for Patients: PinkCheek.be Fact Sheet for Healthcare Providers: GravelBags.it This test is not yet approved or cleared by the [place] FDA and  has been authorized for  detection and/or diagnosis of SARS-CoV-2 by  FDA under an Emergency Use Authorization (EUA). This EUA will remain  in effect (meaning this test can be used) for the duration of the  Covid-19 declaration under Section 564(b)(1) of the Act, 21  U.S.C. section 360bbb-3(b)(1), unless the authorization is  terminated or revoked. Performed at [facility], [place] [address], [place], [place] [address]   MRSA PCR Screening     Status: None   Collection Time: 05/30/19  5:43 AM   Specimen: Nasal Mucosa; Nasopharyngeal  Result Value Ref Range Status   MRSA by PCR NEGATIVE NEGATIVE Final    Comment:        The GeneXpert MRSA Assay (FDA approved for NASAL specimens only), is one component of a comprehensive MRSA colonization surveillance program. It is not intended  to diagnose MRSA infection nor to guide or monitor treatment for MRSA infections. Performed at [facility], [place] [address] [place], [place] [address]   Culture, Urine     Status: Abnormal   Collection Time: 05/31/19  6:23 AM   Specimen: Urine, Random  Result Value Ref Range Status   Specimen Description URINE, RANDOM  Final   Special Requests   Final    NONE Performed at [facility], [place] [address] [place], [place] [address]    Culture 60,000 COLONIES/mL PSEUDOMONAS AERUGINOSA (A)  Final   Report Status 06/02/2019 FINAL  Final   Organism ID, Bacteria PSEUDOMONAS AERUGINOSA (A)  Final      Susceptibility   Pseudomonas aeruginosa - MIC*    CEFTAZIDIME 2 SENSITIVE Sensitive     CIPROFLOXACIN <=0.25 SENSITIVE Sensitive     GENTAMICIN 2 SENSITIVE Sensitive     IMIPENEM 2 SENSITIVE Sensitive     PIP/TAZO <=4 SENSITIVE Sensitive     CEFEPIME 4 SENSITIVE Sensitive     * 60,000 COLONIES/mL PSEUDOMONAS AERUGINOSA      Studies: No results found.  Scheduled Meds: . amiodarone  400 mg Oral BID  . amoxicillin-clavulanate  1 tablet Oral Q12H  . apixaban  2.5 mg Oral BID  . ciprofloxacin  500 mg Oral BID  .  feeding supplement (ENSURE ENLIVE)  237 mL Oral TID BM  . folic acid  1 mg Oral Daily  . metoprolol tartrate  75 mg Oral BID  . multivitamin with minerals  1 tablet Oral Daily  . pantoprazole  20 mg Oral Daily  . predniSONE  30 mg Oral BID WC  . saccharomyces boulardii  250 mg Oral BID  . sodium chloride flush  3 mL Intravenous Q12H  . terazosin  1 mg Oral QHS  . thiamine  100 mg Oral Daily  . traZODone  50 mg Oral Once    Continuous Infusions: . sodium chloride    . diltiazem (CARDIZEM) infusion Stopped (06/01/19 2345)     LOS: 7 days     Kayleen Memos, MD [facility] Pager [phone]  If 7PM-7AM, please contact night-coverage [email or web address] Password [facility] 06/05/2019, 10:25 AM

## 2019-06-05 NOTE — NC FL2 (Signed)
[place] MEDICAID FL2 LEVEL OF CARE SCREENING TOOL     IDENTIFICATION  Patient Name: Matthew Hickman Birthdate: [birth year] Sex: male Admission Date (Current Location): 05/29/2019  [facility] and [place] Number:  Herbalist and Address:  [facility], [place] [address], [place], [place] [address]      Provider Number: [id number]  Attending Physician Name and Address:  Kayleen Memos, DO  Relative Name and Phone Number:  [name] (niece) [phone]    Current Level of Care: Hospital Recommended Level of Care: [place] Prior Approval Number:    Date Approved/Denied:   PASRR Number: [id number]  Discharge Plan: SNF    Current Diagnoses: Patient Active Problem List   Diagnosis Date Noted  . Atrial flutter with rapid ventricular response ([place])   . Fall   . Visual changes 04/10/2019  . Tachycardia 04/10/2019  . Normocytic anemia 04/10/2019  . Melanoma ([place]) 04/10/2019  . Penile bleeding 04/10/2019  . Protein-calorie malnutrition, severe 04/06/2019  . A-fib ([place]) 04/04/2019  . Giant cell arteritis ([place]) 04/04/2019  . Benign essential HTN 04/04/2019    Orientation RESPIRATION BLADDER Height & Weight     Self, Time, Situation, Place  Normal Continent Weight: 123 lb 7.3 oz (56 kg) Height:  5\' 8"  (172.7 cm)  BEHAVIORAL SYMPTOMS/MOOD NEUROLOGICAL BOWEL NUTRITION STATUS      Continent Diet(see discharge summary)  AMBULATORY STATUS COMMUNICATION OF NEEDS Skin   Extensive Assist Verbally Other (Comment)(ecchymosis on arms, skin tear on arms and elbow, open wound right arm)                       Personal Care Assistance Level of Assistance  Bathing, Feeding, Dressing Bathing Assistance: Maximum assistance Feeding assistance: Limited assistance Dressing Assistance: Maximum assistance     Functional Limitations Info  Sight, Hearing, Speech Sight Info: Adequate Hearing Info: Impaired Speech Info: Adequate    SPECIAL CARE  FACTORS FREQUENCY  PT (By licensed PT), OT (By licensed OT)     PT Frequency: min 5x weekly OT Frequency: min 5x weekly            Contractures Contractures Info: Not present    Additional Factors Info  Code Status, Allergies Code Status Info: full Allergies Info: Tape           Current Medications (06/05/2019):  This is the current hospital active medication list Current Facility-Administered Medications  Medication Dose Route Frequency Provider Last Rate Last Admin  . 0.9 %  sodium chloride infusion  250 mL Intravenous PRN Wynetta Fines T, MD      . acetaminophen (TYLENOL) tablet 650 mg  650 mg Oral Q4H PRN Wynetta Fines T, MD      . amiodarone (PACERONE) tablet 400 mg  400 mg Oral BID Deboraha Sprang, MD   400 mg at 06/05/19 [id number]  . amoxicillin-clavulanate (AUGMENTIN) 875-125 MG per tablet 1 tablet  1 tablet Oral Q12H Irene Pap N, DO   1 tablet at 06/05/19 [id number]  . apixaban (ELIQUIS) tablet 2.5 mg  2.5 mg Oral BID Lyndee Leo, RPH   2.5 mg at 06/05/19 [id number]  . ciprofloxacin (CIPRO) tablet 500 mg  500 mg Oral BID Irene Pap N, DO   500 mg at 06/05/19 [id number]  . diltiazem (CARDIZEM) 125 mg in dextrose 5% 125 mL (1 mg/mL) infusion  5-15 mg/hr Intravenous Titrated Furth, Cadence H, PA-C   Stopped at 06/01/19 2345  . feeding supplement (  ENSURE ENLIVE) (ENSURE ENLIVE) liquid 237 mL  237 mL Oral TID BM Wynetta Fines T, MD   237 mL at 06/05/19 0827  . folic acid (FOLVITE) tablet 1 mg  1 mg Oral Daily Jacksonwald, Archie Patten N, DO   1 mg at 06/05/19 [id number]  . metoprolol tartrate (LOPRESSOR) tablet 75 mg  75 mg Oral BID Croitoru, Mihai, MD   75 mg at 06/05/19 0823  . multivitamin with minerals tablet 1 tablet  1 tablet Oral Daily Lequita Halt, MD   1 tablet at 06/05/19 [phone]  . ondansetron (ZOFRAN) injection 4 mg  4 mg Intravenous Q6H PRN Wynetta Fines T, MD      . pantoprazole (PROTONIX) EC tablet 20 mg  20 mg Oral Daily Wynetta Fines T, MD   20 mg at 06/05/19 0906  . predniSONE (DELTASONE) tablet 30 mg   30 mg Oral BID WC Wynetta Fines T, MD   30 mg at 06/05/19 0824  . saccharomyces boulardii (FLORASTOR) capsule 250 mg  250 mg Oral BID Irene Pap N, DO   250 mg at 06/05/19 [id number]  . sodium chloride flush (NS) 0.9 % injection 3 mL  3 mL Intravenous Q12H Wynetta Fines T, MD   3 mL at 06/05/19 0825  . sodium chloride flush (NS) 0.9 % injection 3 mL  3 mL Intravenous PRN Wynetta Fines T, MD      . terazosin (HYTRIN) capsule 1 mg  1 mg Oral QHS Wynetta Fines T, MD   1 mg at 06/04/19 2123  . thiamine tablet 100 mg  100 mg Oral Daily Irene Pap N, DO   100 mg at 06/05/19 [id number]  . traZODone (DESYREL) tablet 50 mg  50 mg Oral Once Vertis Kelch, NP         Discharge Medications: Please see discharge summary for a list of discharge medications.  Relevant Imaging Results:  Relevant Lab Results:   Additional Information SSN: [id number]  Alberteen Sam, LCSW

## 2019-06-05 NOTE — Progress Notes (Signed)
Progress Note  Patient Name: Matthew Hickman Date of Encounter: 06/05/2019  Primary Cardiologist: Sherren Mocha, MD   Patient Profile     86 y.o. male with hypertension, giant cell arteritis with visual involvement, presenting with atypical atrial flutter with rapid ventricular response, difficult to rate control, moderate thrombocytopenia without bleeding complications anticoagulation with Apixoban  Rate control with metop and dig>> amiodarone   Echo EF 3/21 >> EF 55-60%  Pulm Htn Mild  Subjective   No complaints - wants to go home if possible. HR control reasonable, remains in atypical atrial flutter - rates in the 100-120 range.  Inpatient Medications    Scheduled Meds: . amiodarone  400 mg Oral BID  . amoxicillin-clavulanate  1 tablet Oral Q12H  . apixaban  2.5 mg Oral BID  . ciprofloxacin  500 mg Oral BID  . feeding supplement (ENSURE ENLIVE)  237 mL Oral TID BM  . folic acid  1 mg Oral Daily  . metoprolol tartrate  75 mg Oral BID  . multivitamin with minerals  1 tablet Oral Daily  . pantoprazole  20 mg Oral Daily  . predniSONE  30 mg Oral BID WC  . saccharomyces boulardii  250 mg Oral BID  . sodium chloride flush  3 mL Intravenous Q12H  . terazosin  1 mg Oral QHS  . thiamine  100 mg Oral Daily  . traZODone  50 mg Oral Once   Continuous Infusions: . sodium chloride    . diltiazem (CARDIZEM) infusion Stopped (06/01/19 2345)   PRN Meds: sodium chloride, acetaminophen, ondansetron (ZOFRAN) IV, sodium chloride flush   Vital Signs    Vitals:   06/04/19 1934 06/04/19 2100 06/05/19 0000 06/05/19 0332  BP: 115/61 124/68 (!) 101/56 115/76  Pulse: 86 77 63 93  Resp: 19  (!) 23 18  Temp: 97.8 F (36.6 C)  97.6 F (36.4 C) 97.7 F (36.5 C)  TempSrc: Oral  Oral Oral  SpO2: 90%  98% 93%  Weight:    56 kg  Height:        Intake/Output Summary (Last 24 hours) at 06/05/2019 0805 Last data filed at 06/05/2019 [id number] Gross per 24 hour  Intake 1000 ml  Output 625 ml    Net 375 ml   Last 3 Weights 06/05/2019 06/04/2019 06/03/2019  Weight (lbs) 123 lb 7.3 oz 126 lb 15.8 oz 123 lb 3.8 oz  Weight (kg) 56 kg 57.6 kg 55.9 kg      Telemetry    Atrial flutter with variable ventricular response-- personally reviewed  ECG    N/A  Physical Exam   General appearance: alert, no distress and thin Neck: no carotid bruit, no JVD and thyroid not enlarged, symmetric, no tenderness/mass/nodules Lungs: clear to auscultation bilaterally Heart: irregularly irregular rhythm Abdomen: soft, non-tender; bowel sounds normal; no masses,  no organomegaly Extremities: extremities normal, atraumatic, no cyanosis or edema Pulses: 2+ and symmetric Skin: left forearm abscess Neurologic: Mental status: Alert, oriented, thought content appropriate Psych: Pleasant, HOH   Labs    High Sensitivity Troponin:  No results for input(s): TROPONINIHS in the last 720 hours.    Chemistry Recent Labs  Lab 06/03/19 0155 06/04/19 0538 06/05/19 0245  NA 140 140 141  K 4.2 4.3 4.5  CL 104 106 105  CO2 27 26 28   GLUCOSE 175* 152* 174*  BUN 38* 37* 37*  CREATININE 0.73 0.83 0.84  CALCIUM 8.7* 8.6* 8.6*  GFRNONAA >60 >60 >60  GFRAA >60 >60 >60  ANIONGAP 9 8 8      Hematology Recent Labs  Lab 06/03/19 0155 06/04/19 0244 06/05/19 0245  WBC 6.6 6.7 6.4  RBC 2.79* 2.83* 2.82*  HGB 9.4* 9.3* 9.3*  HCT 27.8* 28.5* 28.6*  MCV 99.6 100.7* 101.4*  MCH 33.7 32.9 33.0  MCHC 33.8 32.6 32.5  RDW 20.0* 20.3* 20.4*  PLT 79* 87* 96*    BNPNo results for input(s): BNP, PROBNP in the last 168 hours.   DDimer No results for input(s): DDIMER in the last 168 hours.   Radiology    MR FOREARM LEFT W WO CONTRAST  Result Date: 06/03/2019 CLINICAL DATA:  Mid radial lesion on radiography, for further characterization. EXAM: MRI OF THE LEFT FOREARM WITHOUT AND WITH CONTRAST TECHNIQUE: Multiplanar, multisequence MR imaging of the left forearm was performed before and after the administration  of intravenous contrast. CONTRAST:  5.101mL GADAVIST GADOBUTROL 1 MMOL/ML IV SOLN COMPARISON:  Radiographs from 06/02/2019 FINDINGS: Bones/Joint/Cartilage The small lucency in the radius has fatty signal in represents subtle endosteal scalloping possibly at a nutrient foramen. There is no abnormal enhancement in the bone or other specific worrisome bony characteristics, and the appearance is considered benign/incidental. No osteomyelitis is observed. Ligaments N/A Muscles and Tendons Field heterogeneity around the elbow and wrist which reduces sensitivity in assessing for edema. No intramuscular abscess is identified. Soft tissues Along the dorsal superficial fascial margin and subcutaneous tissues of the distal forearm, and in the marked location, there is abnormal enhancement around a irregular 3.5 by 0.8 by 4.0 cm (volume = 6 cm^3) fluid collection in the subcutaneous tissues which appears to be draining to the cutaneous surface on image 40/13. The appearance suggests a small subcutaneous abscess draining to the skin surface. Uncertain patency of a small overlying superficial vein. IMPRESSION: 1. Small subcutaneous abscess along the dorsal superficial fascial margin of the distal forearm and in the marked location. This appears to likely be draining to the skin surface. No underlying osteomyelitis or intramuscular abscess. 2. Small lucency in the radius is considered benign/incidental, and represents a small incidental focus of endosteal scalloping. 3. Field heterogeneity around the elbow and wrist reduces sensitivity in assessing for in these regions. Electronically Signed   By: Van Clines M.D.   On: 06/03/2019 18:51    Cardiac Studies   Echo 04/05/19  1. Left ventricular ejection fraction, by estimation, is 55 to 60%. The  left ventricle has normal function. The left ventricle has no regional  wall motion abnormalities. Left ventricular diastolic parameters are  consistent with Grade I diastolic   dysfunction (impaired relaxation).  2. Right ventricular systolic function is normal. The right ventricular  size is normal. There is moderately elevated pulmonary artery systolic  pressure. The estimated right ventricular systolic pressure is [id number] mmHg.  3. The mitral valve is normal in structure and function. Trivial mitral  valve regurgitation. No evidence of mitral stenosis.  4. The aortic valve is tricuspid. Aortic valve regurgitation is not  visualized. No aortic stenosis is present.  5. Aortic dilatation noted. There is mild dilatation of the ascending  aorta measuring 40 mm.  6. The inferior vena cava is dilated in size with <50% respiratory  variability, suggesting right atrial pressure of 15 mmHg.    Assessment & Plan    Atrial flutter Atypical s/p DCCV  High Risk Medication Surveillance Amiodarone  Giant cell arteritis  Thrombocytopenia/anemia    heme consult>> " do not feel strongly .Marland Kitchen.represent(s) bone marrow malignancy"   Continue amiodarone  400 mg bid, metoprolol and diltiazem. BP will not allow further medication increase.  For questions or updates, please contact [place] Please consult [email or web address] for contact info under    Pixie Casino, MD, FACC, [place] Director of the [facility]  [facility] Diplomate of the American Board of Clinical Lipidology Attending Cardiologist  Direct Dial: [phone]  Fax: [phone]  Website:  [email or web address]  Pixie Casino, MD  06/05/2019, 8:05 AM

## 2019-06-05 NOTE — Consult Note (Signed)
[facility] Nurse Consult Note: Patient receiving care in [place]. Reason for Consult: LFA wound Wound type: LFA is infectious, bilateral elbows are skin tears Pressure Injury POA: Yes/No/NA Measurement: na Wound bed: LFA is pink and bruised. Bilateral elbows have skin flaps intact Drainage (amount, consistency, odor) serosanginous Periwound: VERY fragile, bruised in scattered areas Dressing procedure/placement/frequency:   Apply a size appropriate piece of Mepitel Kellie Simmering [phone]) over all skin tears and wounds on the arms.  Then wrap with roll gauze (Kerlex).  The Mepitel can stay in place up to 5 days. Change the roll gauze daily and prn soilage. Monitor the wound area(s) for worsening of condition such as: Signs/symptoms of infection,  Increase in size,  Development of or worsening of odor, Development of pain, or increased pain at the affected locations.  Notify the medical team if any of these develop.  Thank you for the consult.  Discussed plan of care with the patient and bedside nurse.  [place] nurse will not follow at this time.  Please re-consult the [place] team if needed.  Val Riles, RN, MSN, CWOCN, CNS-BC, pager [phone]

## 2019-06-05 NOTE — Progress Notes (Signed)
Physical Therapy Treatment Patient Details Name: Matthew Hickman MRN: [id number] DOB: Jun 22, 1925 Today's Date: 06/05/2019    History of Present Illness 86 yo admitted after fall at home with weakness. PMHx: HTN, skin CA, atrial ectopy, bil clubfoot surgery, giant cell arteritis, HOH, visual impairment    PT Comments    Pt pleasant supine on arrival with need for dressing and linen change. Pt with HR 104-120 at rest with rise to 140 with very limited mobility bed to chair 4' away. Pt educated for HEP and RN present throughout session. Will continue to follow.     Follow Up Recommendations  SNF;Supervision/Assistance - 24 hour     Equipment Recommendations  None recommended by PT    Recommendations for Other Services       Precautions / Restrictions Precautions Precautions: Fall Precaution Comments: watch HR    Mobility  Bed Mobility Overal bed mobility: Needs Assistance Bed Mobility: Supine to Sit     Supine to sit: Min assist     General bed mobility comments: increased time and cues to sequence with min assist to elevate trunk with HOB 40 degrees  Transfers Overall transfer level: Needs assistance   Transfers: Sit to/from Stand Sit to Stand: Min assist         General transfer comment: cues for hand placement  Ambulation/Gait   Gait Distance (Feet): 4 Feet Assistive device: Rolling walker (2 wheeled) Gait Pattern/deviations: Step-through pattern;Decreased stride length;Trunk flexed   Gait velocity interpretation: <1.8 ft/sec, indicate of risk for recurrent falls General Gait Details: cues for posture, direction and safety. Gait limited by HR with HR up to 140 with very limited activity   Stairs             Wheelchair Mobility    Modified Rankin (Stroke Patients Only)       Balance Overall balance assessment: Needs assistance;History of Falls   Sitting balance-Leahy Scale: Fair     Standing balance support: Bilateral upper extremity  supported Standing balance-Leahy Scale: Poor                              Cognition Arousal/Alertness: Awake/alert Behavior During Therapy: WFL for tasks assessed/performed Overall Cognitive Status: Impaired/Different from baseline Area of Impairment: Orientation;Safety/judgement                 Orientation Level: Disoriented to;Time     Following Commands: Follows one step commands consistently;Follows one step commands with increased time Safety/Judgement: Decreased awareness of deficits            Exercises General Exercises - Lower Extremity Long Arc Quad: AROM;Both;Seated;10 reps Hip Flexion/Marching: AROM;Both;Seated;10 reps    General Comments        Pertinent Vitals/Pain Pain Assessment: No/denies pain    Home Living                      Prior Function            PT Goals (current goals can now be found in the care plan section) Progress towards PT goals: Progressing toward goals    Frequency    Min 3X/week      PT Plan Current plan remains appropriate    Co-evaluation              AM-PAC PT "6 Clicks" Mobility   Outcome Measure  Help needed turning from your back to your side while in a flat  bed without using bedrails?: A Little Help needed moving from lying on your back to sitting on the side of a flat bed without using bedrails?: A Little Help needed moving to and from a bed to a chair (including a wheelchair)?: A Little Help needed standing up from a chair using your arms (e.g., wheelchair or bedside chair)?: A Little Help needed to walk in hospital room?: A Lot Help needed climbing 3-5 steps with a railing? : A Lot 6 Click Score: 16    End of Session Equipment Utilized During Treatment: Gait belt Activity Tolerance: Treatment limited secondary to medical complications (Comment) Patient left: in chair;with call bell/phone within reach;with chair alarm set;with nursing/sitter in room Nurse Communication:  Mobility status;Precautions PT Visit Diagnosis: Difficulty in walking, not elsewhere classified (R26.2);Other abnormalities of gait and mobility (R26.89);Muscle weakness (generalized) (M62.81);History of falling (Z91.81)     Time: [id number] PT Time Calculation (min) (ACUTE ONLY): 20 min  Charges:  $Gait Training: 8-22 mins                     Bayard Males, PT Acute Rehabilitation Services Pager: [phone] Office: [place] 06/05/2019, 12:57 PM

## 2019-06-05 NOTE — Progress Notes (Signed)
OT Cancellation Note  Patient Details Name: Matthew Hickman MRN: [id number] DOB: 03-Jan-1926   Cancelled Treatment:    Reason Eval/Treat Not Completed: Other (comment);Fatigue/lethargy limiting ability to participate(Pt just returned (heavy +2) to bed by RN staff). OT will continue to follow acutely - due to effort by RN staff continue to recommend SNF placement post-acute once DC appropriate  Jaci Carrel 06/05/2019, 10:40 AM   Jesse Sans OTR/L Acute Rehabilitation Services Pager: [phone] Office: [phone]

## 2019-06-05 NOTE — Plan of Care (Signed)
?  Problem: Education: ?Goal: Knowledge of General Education information will improve ?Description: Including pain rating scale, medication(s)/side effects and non-pharmacologic comfort measures ?Outcome: Progressing ?  ?Problem: Clinical Measurements: ?Goal: Cardiovascular complication will be avoided ?Outcome: Progressing ?  ?Problem: Nutrition: ?Goal: Adequate nutrition will be maintained ?Outcome: Progressing ?  ?Problem: Pain Managment: ?Goal: General experience of comfort will improve ?Outcome: Progressing ?  ?Problem: Safety: ?Goal: Ability to remain free from injury will improve ?Outcome: Progressing ?  ?Problem: Skin Integrity: ?Goal: Risk for impaired skin integrity will decrease ?Outcome: Progressing ?  ?

## 2019-06-06 ENCOUNTER — Inpatient Hospital Stay (HOSPITAL_COMMUNITY): Payer: Medicare Other

## 2019-06-06 DIAGNOSIS — I4819 Other persistent atrial fibrillation: Secondary | ICD-10-CM

## 2019-06-06 LAB — CBC
HCT: 27.9 % — ABNORMAL LOW (ref 39.0–52.0)
Hemoglobin: 9.3 g/dL — ABNORMAL LOW (ref 13.0–17.0)
MCH: 33.8 pg (ref 26.0–34.0)
MCHC: 33.3 g/dL (ref 30.0–36.0)
MCV: 101.5 fL — ABNORMAL HIGH (ref 80.0–100.0)
Platelets: 99 10*3/uL — ABNORMAL LOW (ref 150–400)
RBC: 2.75 MIL/uL — ABNORMAL LOW (ref 4.22–5.81)
RDW: 20.3 % — ABNORMAL HIGH (ref 11.5–15.5)
WBC: 7.2 10*3/uL (ref 4.0–10.5)
nRBC: 0.4 % — ABNORMAL HIGH (ref 0.0–0.2)

## 2019-06-06 LAB — BASIC METABOLIC PANEL
Anion gap: 9 (ref 5–15)
BUN: 38 mg/dL — ABNORMAL HIGH (ref 8–23)
CO2: 25 mmol/L (ref 22–32)
Calcium: 8.7 mg/dL — ABNORMAL LOW (ref 8.9–10.3)
Chloride: 106 mmol/L (ref 98–111)
Creatinine, Ser: 0.77 mg/dL (ref 0.61–1.24)
GFR calc Af Amer: >60 mL/min (ref >60)
GFR calc non Af Amer: >60 mL/min (ref >60)
Glucose, Bld: 170 mg/dL — ABNORMAL HIGH (ref 70–99)
Potassium: 4.4 mmol/L (ref 3.5–5.1)
Sodium: 140 mmol/L (ref 135–145)

## 2019-06-06 LAB — SARS CORONAVIRUS 2 (TAT 6-24 HRS): SARS Coronavirus 2: NEGATIVE

## 2019-06-06 MED ORDER — METOPROLOL TARTRATE 50 MG PO TABS
100.0000 mg | ORAL_TABLET | Freq: Two times a day (BID) | ORAL | Status: DC
  Administered 2019-06-06 – 2019-06-07 (×3): 100 mg via ORAL
  Filled 2019-06-06 (×3): qty 2

## 2019-06-06 NOTE — TOC Progression Note (Signed)
Transition of Care [facility]) - Progression Note    Patient Details  Name: Matthew Hickman MRN: [id number] Date of Birth: 12-01-1925  [facility] of [facility]) CM/SW [place], [place] Phone Number: 06/06/2019, 8:59 AM  Clinical Narrative:     Patient has bed at his preference of [place] when medically stable, MD informed.   Will need updated COVID test prior to discharge to [facility].   Expected Discharge Plan: [place] Barriers to Discharge: Continued Medical Work up  Expected Discharge Plan and Services Expected Discharge Plan: [place] Choice: [place] arrangements for the past 2 months: Single Family Home                                       Social Determinants of Health (SDOH) Interventions    Readmission Risk Interventions No flowsheet data found.

## 2019-06-06 NOTE — Consult Note (Signed)
   [facility] CM Inpatient Consult   06/06/2019  Matthew Hickman 26-Jan-1926 [id number]   Patient screened for disposition of high risk score for unplanned readmission and for length of stay with 8 day hospitalization.  Patient with Medicare [employer] in the [place] with  [facility]  Also, screened for any potential [facility] Care Management service needs.  Review of patient's medical record reveals patient is being recommended for a skilled nursing level of care for short term rehab prior to returning home as noted in the inpatient Transition of Care [TOC] LCSW notes 06/05/19. Patient is from home with his wife.  Primary Care Provider is [name], MD  Plan:  If patient is transitioning to a [facility] SNF will alert the [facility] RN [facility] of disposition for follow up at the facility. Continue to follow progress and disposition to assess for post hospital care management needs.    For questions contact:   Natividad Brood, RN BSN [facility] Liaison  [phone] business mobile phone Toll free office [phone]  Fax number: [phone] [email or web address]

## 2019-06-06 NOTE — Progress Notes (Signed)
   06/06/19 1203  Assess: MEWS Score  BP (!) 94/56  Pulse Rate (!) 106  ECG Heart Rate (!) 119  Resp 19  Level of Consciousness Alert  SpO2 92 %  O2 Device Room Air  Assess: MEWS Score  MEWS Temp 0  MEWS Systolic 1  MEWS Pulse 2  MEWS RR 0  MEWS LOC 0  MEWS Score 3  MEWS Score Color Yellow  Assess: if the MEWS score is Yellow or Red  Were vital signs taken at a resting state? Yes  Focused Assessment Documented focused assessment  Early Detection of Sepsis Score *See Row Information* Low  MEWS guidelines implemented *See Row Information* No, previously yellow, continue vital signs every 4 hours  Document  Patient Outcome Stabilized after interventions  Progress note created (see row info) Yes  continue to monitor.

## 2019-06-06 NOTE — Plan of Care (Signed)
  Problem: Education: Goal: Knowledge of General Education information will improve Description: Including pain rating scale, medication(s)/side effects and non-pharmacologic comfort measures Outcome: Progressing   Problem: Clinical Measurements: Goal: Ability to maintain clinical measurements within normal limits will improve Outcome: Progressing   Problem: Clinical Measurements: Goal: Cardiovascular complication will be avoided Outcome: Progressing   Problem: Nutrition: Goal: Adequate nutrition will be maintained Outcome: Progressing   Problem: Pain Managment: Goal: General experience of comfort will improve Outcome: Progressing   Problem: Skin Integrity: Goal: Risk for impaired skin integrity will decrease Outcome: Progressing   Problem: Safety: Goal: Ability to remain free from injury will improve Outcome: Progressing

## 2019-06-06 NOTE — Progress Notes (Signed)
   06/06/19 1030  Assess: MEWS Score  BP (!) 81/68  ECG Heart Rate (!) 136  Resp 19  SpO2 94 %  O2 Device Room Air  Assess: MEWS Score  MEWS Temp 0  MEWS Systolic 1  MEWS Pulse 3  MEWS RR 0  MEWS LOC 0  MEWS Score 4  MEWS Score Color Red  Assess: if the MEWS score is Yellow or Red  Were vital signs taken at a resting state? Yes  Focused Assessment Documented focused assessment  Early Detection of Sepsis Score *See Row Information* Low  MEWS guidelines implemented *See Row Information* Yes  Treat  MEWS Interventions Escalated (See documentation below)  Take Vital Signs  Increase Vital Sign Frequency  Red: Q 1hr X 4 then Q 4hr X 4, if remains red, continue Q 4hrs  Escalate  MEWS: Escalate Red: discuss with charge nurse/RN and provider, consider discussing with RRT  Notify: Charge Nurse/RN  Name of Charge Nurse/RN Notified [email or web address]  Date Charge Nurse/RN Notified 06/06/19  Time Charge Nurse/RN Notified 51  Notify: Provider  Provider Name/Title dr hall  Date Provider Notified 06/06/19  Time Provider Notified 5  Notification Type Page  Notification Reason Other (Comment) (up in chair, had metropolol)  Response Other (Comment) (dr hall wants [place] to call cards)  Date of Provider Response 06/06/19  Time of Provider Response 1049  Document  Patient Outcome Not stable and remains on department  Progress note created (see row info) Yes   Paged dr hall regarding pt up to chair with PT/OT and brush teeth at sink. HR remained elevated to high 130's and BP dropped to 81/68 once resting in chair. Returned to bed. Dr Nevada Crane returned call and ask for [place] to call cards to see if anything else could be done.

## 2019-06-06 NOTE — Progress Notes (Signed)
Returned to bed    06/06/19 1036  Assess: MEWS Score  BP 111/68  ECG Heart Rate (!) 124  Resp (!) 25  Assess: MEWS Score  MEWS Temp 0  MEWS Systolic 0  MEWS Pulse 2  MEWS RR 1  MEWS LOC 0  MEWS Score 3  MEWS Score Color Yellow  Assess: if the MEWS score is Yellow or Red  Were vital signs taken at a resting state? Yes  Focused Assessment Documented focused assessment  Early Detection of Sepsis Score *See Row Information* Low  MEWS guidelines implemented *See Row Information* No, vital signs rechecked (pt back to bed)

## 2019-06-06 NOTE — Progress Notes (Signed)
Subjective:  Patient is alert and pleasant on exam. Reports no pain in bilateral arms. Reports no new itching or skin changes.   Objective:  PE: VITALS:   Vitals:   06/06/19 1700 06/06/19 1800 06/06/19 1900 06/06/19 1921  BP: 92/63 (!) 98/59 109/63 101/71  Pulse: (!) 120 (!) 110 (!) 115 (!) 113  Resp: 20 20 18 17   Temp:    98.3 F (36.8 C)  TempSrc:    Oral  SpO2: 99% 95% 97% 96%  Weight:      Height:       General:  Resp: no use of accessory musculature Skin:     MSK: site of abscess on left forearm shown above. Appears to be healing well, no redness, no new swelling or fluctuance, mild drainage.    LABS  Results for orders placed or performed during the hospital encounter of 05/29/19 (from the past 24 hour(s))  CBC     Status: Abnormal   Collection Time: 06/06/19  2:55 AM  Result Value Ref Range   WBC 7.2 4.0 - 10.5 K/uL   RBC 2.75 (L) 4.22 - 5.81 MIL/uL   Hemoglobin 9.3 (L) 13.0 - 17.0 g/dL   HCT 27.9 (L) 39.0 - 52.0 %   MCV 101.5 (H) 80.0 - 100.0 fL   MCH 33.8 26.0 - 34.0 pg   MCHC 33.3 30.0 - 36.0 g/dL   RDW 20.3 (H) 11.5 - 15.5 %   Platelets 99 (L) 150 - 400 K/uL   nRBC 0.4 (H) 0.0 - 0.2 %  Basic metabolic panel     Status: Abnormal   Collection Time: 06/06/19  2:55 AM  Result Value Ref Range   Sodium 140 135 - 145 mmol/L   Potassium 4.4 3.5 - 5.1 mmol/L   Chloride 106 98 - 111 mmol/L   CO2 25 22 - 32 mmol/L   Glucose, Bld 170 (H) 70 - 99 mg/dL   BUN 38 (H) 8 - 23 mg/dL   Creatinine, Ser 0.77 0.61 - 1.24 mg/dL   Calcium 8.7 (L) 8.9 - 10.3 mg/dL   GFR calc non Af Amer >60 >60 mL/min   GFR calc Af Amer >60 >60 mL/min   Anion gap 9 5 - 15  SARS CORONAVIRUS 2 (TAT 6-24 HRS) Nasopharyngeal Nasopharyngeal Swab     Status: None   Collection Time: 06/06/19 10:12 AM   Specimen: Nasopharyngeal Swab  Result Value Ref Range   SARS Coronavirus 2 NEGATIVE NEGATIVE    DG CHEST PORT 1 VIEW  Result Date: 06/06/2019 CLINICAL DATA:  Chest pain.  Shortness  of breath. EXAM: PORTABLE CHEST 1 VIEW COMPARISON:  05/29/2019.  04/04/2019. FINDINGS: Cardiomegaly. Diffuse bilateral pulmonary interstitial prominence and bilateral pleural effusions. Findings suggest CHF. Pneumonitis cannot be excluded. Degenerative change thoracic spine. IMPRESSION: Cardiomegaly with diffuse bilateral interstitial prominence and bilateral pleural effusions suggesting CHF. Pneumonitis cannot be excluded. Electronically Signed   By: Marcello Moores  Register   On: 06/06/2019 13:56    Assessment/Plan: Active Problems:   A-fib ([place])  Dorsal subcutaneous abscess, left forearm, secondary to previous IV site - left forearm appears much improved, no new fluctuance I can appreciate - appreciate daily dressing changes from nursing staff, plan to continue daily dressing changes with mepitel and gauze - will continue to follow   Contact information:   Weekdays 8-5 Merlene Pulling, PA-C [phone] A fter hours and holidays please check [email or web address] for group call information for [facility] 06/06/2019,  8:25 PM

## 2019-06-06 NOTE — Plan of Care (Signed)

## 2019-06-06 NOTE — Progress Notes (Signed)
PROGRESS NOTE  ROMAIN ERION WCB:[id number] DOB: [birth year] DOA: 05/29/2019 PCP: [name], MD  HPI/Recap of past 24 hours: Matthew Hickman is a 86 y.o. male with medical history significant of hypertension, recently diagnosed paroxysmal A. Fib, giant cell arteritis on chronic steroid, bilateral vision impairment, hearing impairment who presented with a fall at home.   ED Course: CT head nonacute, in A. fib with rapid ventricular rate on presentation.  Cardiology was consulted and [facility] was asked to admit.  Initially received amiodarone drip and 3 doses of oral digoxin 0.25 mg every 2 hours.  P.o. Lopressor dose was increased to 100 mg twice daily (06/06/19).  Had a TEE directed cardioversion on 06/02/2019.  Converted to sinus rhythm but with frequent PACs.  Digoxin was stopped.  Amiodarone drip switched to p.o. amiodarone 400 mg twice daily.  On Eliquis for CVA prevention.  He was seen by hematology/oncology due to concern for MDS.  No indication for bone marrow biopsy, monitoring for now, uptrending platelet count.  Hospital course complicated by left forearm cellulitis with purulence for which he received 3 days of IV vancomycin.  Switched to Augmentin on 2019-06-26 after assessment by hand surgeon Dr. Mardelle Matte.  X-ray of forearm showed bone lucency, osteomyelitis was ruled out by MRI.  Revealed small subcutaneous abscess likely draining to the skin surface.  Recommendations for wound care with daily dressing changes and monitoring for progression.  06/06/19: Seen and examined.  He has no specific complaints.  He denies any palpitations, chest pain or dyspnea.  His heart rate remains elevated 130s.  Will obtain a twelve-lead EKG.    Assessment/Plan: Active Problems:   A-fib (HCC)  A. flutter with RVR post TEE guided cardioversion on 06/02/19 by Dr. Gilman Schmidt Post cardioversion, in sinus rhythm with sinus arrhythmia, PACs. Diltiazem drip stopped on 06/01/19 Currently on p.o. amiodarone 400 mg twice  daily, p.o. Lopressor dose increased today 100 mg twice daily.  Will monitor BP and HR on new doses.  Maintain MAP >65 if possible. His heart rate still fluctuates up to the 130s. Off digoxin Continue Eliquis for CVA prevention Last 2D echo on 04/05/19> normal LVEF 55 to 60% with grade 1 diastolic dysfunction. Potassium 4.5 and magnesium 2.0 at goal. Repeat twelve-lead EKG today Seen by cardiology.  Left forearm purulent cellulitis, ruled out osteomyelitis by MRI. Osteomyelitis was ruled out by MRI.  However showed small subcutaneous abscess likely draining to the skin surface.  Seen by Hand surgery, Dr. Mardelle Matte recommends wound care and daily dressing changes Completed 3 days IV vancomycin then switched to Augmentin 5/2;  Resolved hypotensive, likely iatrogenic Blood pressure is stable Maintain MAP greater than 65  Pseudomonas aeruginosa UTI Presented with pyuria Urine culture growing 60,000 colonies of Pseudomonas aeruginosa, pansensitive.  Received 3 days of Rocephin and 5 days of oral ciprofloxacin..  Acute thrombocytopenia, improving/macrocytic anemia likely secondary to bone marrow suppression in the setting of acute illness. Platelets 98 K Hemoglobin 9.3 with MCV 101 Seen by hematology on 06/03/2019, no indication for bone marrow biopsy, less likely MDS.  Giant cell arteritis Continue prednisone, home regimen Continue PPI for GI prophylaxis  Moderate pulmonary hypertension Euvolemic on exam Management per cardiology's recommendation  BPH Continue Terazosin.   Monitor urine output No evidence of acute renal failure.  Physical debility PT OT assessment recommended SNF TOC consulted to assist with SNF placement. Continue PT OT with assistance and fall precautions. Patient has a bed at [facility] and will likely dc tomorrow  History of alcohol use disorder No evidence of alcohol withdrawal at the time of this evaluation Continue multivitamins, thiamine and folate  DVT  prophylaxis:  Eliquis Code Status: Full code Family Communication:  Will call family if okay with the patient.  Disposition Plan:  Patient is from home.  Anticipate discharge to SNF likely tomorrow.  Barrier to discharge: Cardiac medications adjustment, hypotension and tachycardia.    Consults called: cardiology, hematology oncology, hand surgery.       Objective: Vitals:   06/06/19 0614 06/06/19 0725 06/06/19 1030 06/06/19 1036  BP:  121/69 (!) 81/68 111/68  Pulse:  79    Resp:  (!) 22 19 (!) 25  Temp:  97.6 F (36.4 C)    TempSrc:  Oral    SpO2:  94% 94%   Weight: 57.3 kg     Height:        Intake/Output Summary (Last 24 hours) at 06/06/2019 1052 Last data filed at 06/06/2019 0525 Gross per 24 hour  Intake 723 ml  Output 875 ml  Net -152 ml   Filed Weights   06/04/19 0342 06/05/19 0332 06/06/19 0614  Weight: 57.6 kg 56 kg 57.3 kg    Exam:  . General: 86 y.o. year-old male very hard of hearing.  In no acute distress.  Alert and oriented x3.  . Cardiovascular: Irregular rate and rhythm no rubs or gallops.   Marland Kitchen Respiratory: Clear to auscultation no wheezes or rales.   . Abdomen: Soft Nontender Normal Bowel Sounds Present.   . Musculoskeletal: Left forearm in dressings. . Psychiatry: Mood is appropriate for condition and setting.  Data Reviewed: CBC: Recent Labs  Lab 06/02/19 0210 06/03/19 0155 06/04/19 0244 06/05/19 0245 06/06/19 0255  WBC 7.6 6.6 6.7 6.4 7.2  HGB 9.6* 9.4* 9.3* 9.3* 9.3*  HCT 28.0* 27.8* 28.5* 28.6* 27.9*  MCV 100.0 99.6 100.7* 101.4* 101.5*  PLT 73* 79* 87* 96* 99*   Basic Metabolic Panel: Recent Labs  Lab 06/02/19 0210 06/03/19 0155 06/04/19 0538 06/05/19 0245 06/06/19 0255  NA 137 140 140 141 140  K 4.2 4.2 4.3 4.5 4.4  CL 101 104 106 105 106  CO2 28 27 26 28 25   GLUCOSE 184* 175* 152* 174* 170*  BUN 40* 38* 37* 37* 38*  CREATININE 0.86 0.73 0.83 0.84 0.77  CALCIUM 8.5* 8.7* 8.6* 8.6* 8.7*  MG  --   --  2.0  --   --     GFR: Estimated Creatinine Clearance: 48.7 mL/min (by C-G formula based on SCr of 0.77 mg/dL). Liver Function Tests: No results for input(s): AST, ALT, ALKPHOS, BILITOT, PROT, ALBUMIN in the last 168 hours. No results for input(s): LIPASE, AMYLASE in the last 168 hours. No results for input(s): AMMONIA in the last 168 hours. Coagulation Profile: No results for input(s): INR, PROTIME in the last 168 hours. Cardiac Enzymes: No results for input(s): CKTOTAL, CKMB, CKMBINDEX, TROPONINI in the last 168 hours. BNP (last 3 results) No results for input(s): PROBNP in the last 8760 hours. HbA1C: No results for input(s): HGBA1C in the last 72 hours. CBG: No results for input(s): GLUCAP in the last 168 hours. Lipid Profile: No results for input(s): CHOL, HDL, LDLCALC, TRIG, CHOLHDL, LDLDIRECT in the last 72 hours. Thyroid Function Tests: No results for input(s): TSH, T4TOTAL, FREET4, T3FREE, THYROIDAB in the last 72 hours. Anemia Panel: No results for input(s): VITAMINB12, FOLATE, FERRITIN, TIBC, IRON, RETICCTPCT in the last 72 hours. Urine analysis:    Component Value Date/Time  COLORURINE YELLOW 05/29/2019 1211   APPEARANCEUR HAZY (A) 05/29/2019 1211   LABSPEC 1.015 05/29/2019 1211   PHURINE 7.0 05/29/2019 1211   GLUCOSEU NEGATIVE 05/29/2019 1211   HGBUR SMALL (A) 05/29/2019 1211   BILIRUBINUR NEGATIVE 05/29/2019 1211   KETONESUR NEGATIVE 05/29/2019 1211   PROTEINUR 30 (A) 05/29/2019 1211   UROBILINOGEN 1.0 09/20/2010 1226   NITRITE NEGATIVE 05/29/2019 1211   LEUKOCYTESUR LARGE (A) 05/29/2019 1211   Sepsis Labs: '@LABRCNTIP'$ (procalcitonin:4,lacticidven:4)  ) Recent Results (from the past 240 hour(s))  Respiratory Panel by RT PCR (Flu A&B, Covid) - Nasopharyngeal Swab     Status: None   Collection Time: 05/29/19  1:58 PM   Specimen: Nasopharyngeal Swab  Result Value Ref Range Status   SARS Coronavirus 2 by RT PCR NEGATIVE NEGATIVE Final    Comment: (NOTE) SARS-CoV-2 target  nucleic acids are NOT DETECTED. The SARS-CoV-2 RNA is generally detectable in upper respiratoy specimens during the acute phase of infection. The lowest concentration of SARS-CoV-2 viral copies this assay can detect is 131 copies/mL. A negative result does not preclude SARS-Cov-2 infection and should not be used as the sole basis for treatment or other patient management decisions. A negative result may occur with  improper specimen collection/handling, submission of specimen other than nasopharyngeal swab, presence of viral mutation(s) within the areas targeted by this assay, and inadequate number of viral copies (<131 copies/mL). A negative result must be combined with clinical observations, patient history, and epidemiological information. The expected result is Negative. Fact Sheet for Patients:  PinkCheek.be Fact Sheet for Healthcare Providers:  GravelBags.it This test is not yet ap proved or cleared by the [place] FDA and  has been authorized for detection and/or diagnosis of SARS-CoV-2 by FDA under an Emergency Use Authorization (EUA). This EUA will remain  in effect (meaning this test can be used) for the duration of the COVID-19 declaration under Section 564(b)(1) of the Act, 21 U.S.C. section 360bbb-3(b)(1), unless the authorization is terminated or revoked sooner.    Influenza A by PCR NEGATIVE NEGATIVE Final   Influenza B by PCR NEGATIVE NEGATIVE Final    Comment: (NOTE) The Xpert Xpress SARS-CoV-2/FLU/RSV assay is intended as an aid in  the diagnosis of influenza from Nasopharyngeal swab specimens and  should not be used as a sole basis for treatment. Nasal washings and  aspirates are unacceptable for Xpert Xpress SARS-CoV-2/FLU/RSV  testing. Fact Sheet for Patients: PinkCheek.be Fact Sheet for Healthcare Providers: GravelBags.it This test is not  yet approved or cleared by the [place] FDA and  has been authorized for detection and/or diagnosis of SARS-CoV-2 by  FDA under an Emergency Use Authorization (EUA). This EUA will remain  in effect (meaning this test can be used) for the duration of the  Covid-19 declaration under Section 564(b)(1) of the Act, 21  U.S.C. section 360bbb-3(b)(1), unless the authorization is  terminated or revoked. Performed at [facility], [place] [address] [place], [place] [address]   MRSA PCR Screening     Status: None   Collection Time: 05/30/19  5:43 AM   Specimen: Nasal Mucosa; Nasopharyngeal  Result Value Ref Range Status   MRSA by PCR NEGATIVE NEGATIVE Final    Comment:        The GeneXpert MRSA Assay (FDA approved for NASAL specimens only), is one component of a comprehensive MRSA colonization surveillance program. It is not intended to diagnose MRSA infection nor to guide or monitor treatment for MRSA infections. Performed at [facility], [place] [address], [place], [place] [address]   Culture, Urine     Status: Abnormal   Collection Time: 05/31/19  6:23 AM   Specimen: Urine, Random  Result Value Ref Range Status   Specimen Description URINE, RANDOM  Final   Special Requests   Final    NONE Performed at [facility], [place] [address] [place], [place] [address]    Culture 60,000 COLONIES/mL PSEUDOMONAS AERUGINOSA (A)  Final   Report Status 06/02/2019 FINAL  Final   Organism ID, Bacteria PSEUDOMONAS AERUGINOSA (A)  Final      Susceptibility   Pseudomonas aeruginosa - MIC*    CEFTAZIDIME 2 SENSITIVE Sensitive     CIPROFLOXACIN <=0.25 SENSITIVE Sensitive     GENTAMICIN 2 SENSITIVE Sensitive     IMIPENEM 2 SENSITIVE Sensitive     PIP/TAZO <=4 SENSITIVE Sensitive     CEFEPIME 4 SENSITIVE Sensitive     * 60,000 COLONIES/mL PSEUDOMONAS AERUGINOSA      Studies: No results found.  Scheduled Meds: . amiodarone  400 mg Oral BID  . amoxicillin-clavulanate  1 tablet  Oral Q12H  . apixaban  2.5 mg Oral BID  . feeding supplement (ENSURE ENLIVE)  237 mL Oral TID BM  . folic acid  1 mg Oral Daily  . metoprolol tartrate  100 mg Oral BID  . multivitamin with minerals  1 tablet Oral Daily  . pantoprazole  20 mg Oral Daily  . predniSONE  30 mg Oral BID WC  . saccharomyces boulardii  250 mg Oral BID  . sodium chloride flush  3 mL Intravenous Q12H  . terazosin  1 mg Oral QHS  . thiamine  100 mg Oral Daily  . traZODone  50 mg Oral Once    Continuous Infusions: . sodium chloride    . diltiazem (CARDIZEM) infusion Stopped (06/01/19 2345)     LOS: 8 days     Kayleen Memos, MD [facility] Pager [phone]  If 7PM-7AM, please contact night-coverage [email or web address] Password [email or web address] 06/06/2019, 10:52 AM

## 2019-06-06 NOTE — Progress Notes (Signed)
Occupational Therapy Treatment Patient Details Name: Matthew Hickman MRN: [id number] DOB: [birth year] Today's Date: 06/06/2019    History of present illness 86 yo admitted after fall at home with weakness. PMHx: HTN, skin CA, atrial ectopy, bil clubfoot surgery, giant cell arteritis, HOH, visual impairment   OT comments  Pt prgoressing with OOB therapy, but continues to be fatigued and SOB regardless of O2 sats >90%. Pt very limited by decreased ability to care for self, decreased strength and decreased activity tolerance. Pt unable to stand at sink for ADL due to fatigue and increased HR. HR increasing to 136-138 BPM seated and taking rest breaks. Pt performing simple commands with multimodal cues, HOH and requiring directions repeated. Pt would greatly benefit from continued OT skilled services in post acute setting. OT following acutely.   Follow Up Recommendations  SNF    Equipment Recommendations  None recommended by OT    Recommendations for Other Services      Precautions / Restrictions Precautions Precautions: Fall Precaution Comments: watch HR Restrictions Weight Bearing Restrictions: No       Mobility Bed Mobility Overal bed mobility: Needs Assistance Bed Mobility: Supine to Sit     Supine to sit: Min assist     General bed mobility comments: Pt facilitating trunk elevation with BLE movements.  Transfers Overall transfer level: Needs assistance Equipment used: Rolling walker (2 wheeled) Transfers: Sit to/from [facility] Sit to Stand: Min assist Stand pivot transfers: Mod assist       General transfer comment: Pt having difficulty with hand placement and requiring mulitmodal cues to lean forward instead of backward.    Balance Overall balance assessment: Needs assistance;History of Falls   Sitting balance-Leahy Scale: Fair     Standing balance support: Bilateral upper extremity supported Standing balance-Leahy Scale: Poor Standing  balance comment: Pt unable to stand at sink for ADL due to fatigue and increased HR.                           ADL either performed or assessed with clinical judgement   ADL Overall ADL's : Needs assistance/impaired     Grooming: Set up;Sitting Grooming Details (indicate cue type and reason): HR increasing to 136-138 BPM seated and taking rest breaks                     Toileting- Clothing Manipulation and Hygiene: Set up;Sitting/lateral lean Toileting - Clothing Manipulation Details (indicate cue type and reason): Pt seated for use of urinal.     Functional mobility during ADLs: Minimal assistance;Rolling walker General ADL Comments: Pt very limited by decreased ability to care for self, decreased strength and decreased activity tolerance.     Vision   Vision Assessment?: No apparent visual deficits   Perception     Praxis      Cognition Arousal/Alertness: Awake/alert Behavior During Therapy: WFL for tasks assessed/performed Overall Cognitive Status: Impaired/Different from baseline Area of Impairment: Orientation;Safety/judgement                 Orientation Level: Disoriented to;Time       Safety/Judgement: Decreased awareness of deficits     General Comments: Increased times and multimodal cueing for simple commands        Exercises     Shoulder Instructions       General Comments HR increased to 138 BPM with transfer and minimal seated ADL. RN aware.    Pertinent Vitals/ Pain  Pain Assessment: No/denies pain  Home Living                                          Prior Functioning/Environment              Frequency  Min 2X/week        Progress Toward Goals  OT Goals(current goals can now be found in the care plan section)  Progress towards OT goals: Progressing toward goals  Acute Rehab OT Goals Patient Stated Goal: be able to walk and get stronger OT Goal Formulation: With patient Time  For Goal Achievement: 06/15/19 ADL Goals Pt Will Perform Grooming: with min guard assist;standing Pt Will Perform Lower Body Bathing: with min assist;sit to/from stand;sitting/lateral leans Pt Will Perform Lower Body Dressing: with min assist;sitting/lateral leans;sit to/from stand Pt Will Transfer to Toilet: with min guard assist;ambulating;regular height toilet;grab bars  Plan Discharge plan remains appropriate    Co-evaluation                 AM-PAC OT "6 Clicks" Daily Activity     Outcome Measure   Help from another person eating meals?: None Help from another person taking care of personal grooming?: A Little Help from another person toileting, which includes using toliet, bedpan, or urinal?: A Lot Help from another person bathing (including washing, rinsing, drying)?: A Lot Help from another person to put on and taking off regular upper body clothing?: A Little Help from another person to put on and taking off regular lower body clothing?: A Lot 6 Click Score: 16    End of Session Equipment Utilized During Treatment: Gait belt;Rolling walker  OT Visit Diagnosis: History of falling (Z91.81);Unsteadiness on feet (R26.81);Muscle weakness (generalized) (M62.81)   Activity Tolerance Patient tolerated treatment well   Patient Left in chair;with call bell/phone within reach;with chair alarm set   Nurse Communication Mobility status        Time: [id number] OT Time Calculation (min): 32 min  Charges: OT General Charges $OT Visit: 1 Visit OT Treatments $Self Care/Home Management : 23-37 mins  Jefferey Pica, OTR/L Acute Rehabilitation Services Pager: [phone] Office: [phone]   Matthew Hickman 06/06/2019, 4:03 PM

## 2019-06-06 NOTE — Progress Notes (Signed)
   06/06/19 1054  Assess: MEWS Score  Pulse Rate (!) 108  ECG Heart Rate (!) 108  Resp (!) 22  Level of Consciousness Alert  SpO2 92 %  O2 Device Room Air  Assess: MEWS Score  MEWS Temp 0  MEWS Systolic 0  MEWS Pulse 1  MEWS RR 1  MEWS LOC 0  MEWS Score 2  MEWS Score Color Yellow  Document  Progress note created (see row info) Yes  dr Debara Pickett returned call, no new orders rec'd. Notified dr [place] Crane of recommendations from cards  to monitor due to metropolol just increased.

## 2019-06-06 NOTE — Progress Notes (Signed)
Notified dr hall regarding cxr results new orders rec'd to encourage incentive spirometer and flutter valve.

## 2019-06-06 NOTE — Progress Notes (Signed)
   06/06/19 1330  Assess: MEWS Score  BP (!) 103/58  Pulse Rate (!) 107  ECG Heart Rate 92  Resp (!) 27  Level of Consciousness Alert  SpO2 98 %  O2 Device Room Air  Assess: MEWS Score  MEWS Temp 0  MEWS Systolic 0  MEWS Pulse 0  MEWS RR 2  MEWS LOC 0  MEWS Score 2  MEWS Score Color Yellow  Assess: if the MEWS score is Yellow or Red  Were vital signs taken at a resting state? Yes  Focused Assessment Documented focused assessment  Early Detection of Sepsis Score *See Row Information* Low  MEWS guidelines implemented *See Row Information* Yes  Treat  MEWS Interventions Escalated (See documentation below)  Take Vital Signs  Increase Vital Sign Frequency  Yellow: Q 2hr X 2 then Q 4hr X 2, if remains yellow, continue Q 4hrs  Escalate  MEWS: Escalate Yellow: discuss with charge nurse/RN and consider discussing with provider and RRT  Notify: Provider  Provider Name/Title dr hall  Date Provider Notified 06/06/19  Time Provider Notified 1331  Notification Type Page  Notification Reason Change in status  Response See new orders  Date of Provider Response 06/06/19  Time of Provider Response 1332  Document  Patient Outcome Not stable and remains on department  Progress note created (see row info) Yes  notified dr hall regarding pt c/o SOB, tachypnic, new orders rec'd for stat cxr. Will continue to monitor.

## 2019-06-06 NOTE — Progress Notes (Signed)
Progress Note  Patient Name: Matthew Hickman Date of Encounter: 06/06/2019  Primary Cardiologist: Sherren Mocha, MD   Patient Profile     86 y.o. male with hypertension, giant cell arteritis with visual involvement, presenting with atypical atrial flutter with rapid ventricular response, difficult to rate control, moderate thrombocytopenia without bleeding complications anticoagulation with Apixoban  Rate control with metop and dig>> amiodarone   Echo EF 3/21 >> EF 55-60%  Pulm Htn Mild  Subjective   No complaints - wants to go home if possible. HR variable between 80-120's. BP in the Q000111Q systolic.  Inpatient Medications    Scheduled Meds: . amiodarone  400 mg Oral BID  . amoxicillin-clavulanate  1 tablet Oral Q12H  . apixaban  2.5 mg Oral BID  . ciprofloxacin  500 mg Oral BID  . feeding supplement (ENSURE ENLIVE)  237 mL Oral TID BM  . folic acid  1 mg Oral Daily  . metoprolol tartrate  75 mg Oral BID  . multivitamin with minerals  1 tablet Oral Daily  . pantoprazole  20 mg Oral Daily  . predniSONE  30 mg Oral BID WC  . saccharomyces boulardii  250 mg Oral BID  . sodium chloride flush  3 mL Intravenous Q12H  . terazosin  1 mg Oral QHS  . thiamine  100 mg Oral Daily  . traZODone  50 mg Oral Once   Continuous Infusions: . sodium chloride    . diltiazem (CARDIZEM) infusion Stopped (06/01/19 2345)   PRN Meds: sodium chloride, acetaminophen, ondansetron (ZOFRAN) IV, sodium chloride flush   Vital Signs    Vitals:   06/05/19 2350 06/06/19 0341 06/06/19 0614 06/06/19 0725  BP: (!) 102/55 116/75  121/69  Pulse: 65 95  79  Resp: 18 20  (!) 22  Temp: 97.6 F (36.4 C) 97.6 F (36.4 C)  97.6 F (36.4 C)  TempSrc: Oral Oral  Oral  SpO2: 95% 97%  94%  Weight:   57.3 kg   Height:        Intake/Output Summary (Last 24 hours) at 06/06/2019 0758 Last data filed at 06/06/2019 [id number] Gross per 24 hour  Intake 963 ml  Output 1025 ml  Net -62 ml   Last 3 Weights 06/06/2019  06/05/2019 06/04/2019  Weight (lbs) 126 lb 5.2 oz 123 lb 7.3 oz 126 lb 15.8 oz  Weight (kg) 57.3 kg 56 kg 57.6 kg      Telemetry    Atrial flutter with variable ventricular response-- personally reviewed  ECG    N/A  Physical Exam   General appearance: alert, no distress and thin Neck: no carotid bruit, no JVD and thyroid not enlarged, symmetric, no tenderness/mass/nodules Lungs: clear to auscultation bilaterally Heart: irregularly irregular rhythm Abdomen: soft, non-tender; bowel sounds normal; no masses,  no organomegaly Extremities: extremities normal, atraumatic, no cyanosis or edema Pulses: 2+ and symmetric Skin: left forearm abscess Neurologic: Mental status: Alert, oriented, thought content appropriate Psych: Pleasant, HOH   Labs    High Sensitivity Troponin:  No results for input(s): TROPONINIHS in the last 720 hours.    Chemistry Recent Labs  Lab 06/04/19 0538 06/05/19 0245 06/06/19 0255  NA 140 141 140  K 4.3 4.5 4.4  CL 106 105 106  CO2 26 28 25   GLUCOSE 152* 174* 170*  BUN 37* 37* 38*  CREATININE 0.83 0.84 0.77  CALCIUM 8.6* 8.6* 8.7*  GFRNONAA >60 >60 >60  GFRAA >60 >60 >60  ANIONGAP 8 8 9  Hematology Recent Labs  Lab 06/04/19 0244 06/05/19 0245 06/06/19 0255  WBC 6.7 6.4 7.2  RBC 2.83* 2.82* 2.75*  HGB 9.3* 9.3* 9.3*  HCT 28.5* 28.6* 27.9*  MCV 100.7* 101.4* 101.5*  MCH 32.9 33.0 33.8  MCHC 32.6 32.5 33.3  RDW 20.3* 20.4* 20.3*  PLT 87* 96* 99*    BNPNo results for input(s): BNP, PROBNP in the last 168 hours.   DDimer No results for input(s): DDIMER in the last 168 hours.   Radiology    No results found.  Cardiac Studies   Echo 04/05/19  1. Left ventricular ejection fraction, by estimation, is 55 to 60%. The  left ventricle has normal function. The left ventricle has no regional  wall motion abnormalities. Left ventricular diastolic parameters are  consistent with Grade I diastolic  dysfunction (impaired relaxation).  2.  Right ventricular systolic function is normal. The right ventricular  size is normal. There is moderately elevated pulmonary artery systolic  pressure. The estimated right ventricular systolic pressure is [id number] mmHg.  3. The mitral valve is normal in structure and function. Trivial mitral  valve regurgitation. No evidence of mitral stenosis.  4. The aortic valve is tricuspid. Aortic valve regurgitation is not  visualized. No aortic stenosis is present.  5. Aortic dilatation noted. There is mild dilatation of the ascending  aorta measuring 40 mm.  6. The inferior vena cava is dilated in size with <50% respiratory  variability, suggesting right atrial pressure of 15 mmHg.    Assessment & Plan    Atrial flutter Atypical s/p DCCV  High Risk Medication Surveillance Amiodarone  Giant cell arteritis  Thrombocytopenia/anemia    heme consult>> " do not feel strongly .Marland Kitchen.represent(s) bone marrow malignancy"   Continue amiodarone 400 mg bid diltiazem. Increase metoprolol to 100 mg BID for better rate control. No further suggestions at this time.  [facility] will sign off.   Medication Recommendations:  Increase metoprolol Other recommendations (labs, testing, etc):  Wean amiodarone as outpatient Follow up as an outpatient:  Dr. Burt Knack or APP at [facility] in 2 weeks  For questions or updates, please contact [place] Please consult [email or web address] for contact info under    Pixie Casino, MD, FACC, [place] Director of the [facility] Diplomate of the American Board of Clinical Lipidology Attending Cardiologist  Direct Dial: [phone]  Fax: [phone]  Website:  [email or web address]  Pixie Casino, MD  06/06/2019, 7:58 AM

## 2019-06-07 DIAGNOSIS — I959 Hypotension, unspecified: Secondary | ICD-10-CM | POA: Diagnosis not present

## 2019-06-07 DIAGNOSIS — R0602 Shortness of breath: Secondary | ICD-10-CM | POA: Diagnosis not present

## 2019-06-07 DIAGNOSIS — Z20822 Contact with and (suspected) exposure to covid-19: Secondary | ICD-10-CM | POA: Diagnosis not present

## 2019-06-07 DIAGNOSIS — I110 Hypertensive heart disease with heart failure: Secondary | ICD-10-CM | POA: Diagnosis present

## 2019-06-07 DIAGNOSIS — Z7401 Bed confinement status: Secondary | ICD-10-CM | POA: Diagnosis not present

## 2019-06-07 DIAGNOSIS — C439 Malignant melanoma of skin, unspecified: Secondary | ICD-10-CM | POA: Diagnosis not present

## 2019-06-07 DIAGNOSIS — L03114 Cellulitis of left upper limb: Secondary | ICD-10-CM | POA: Diagnosis not present

## 2019-06-07 DIAGNOSIS — I272 Pulmonary hypertension, unspecified: Secondary | ICD-10-CM | POA: Diagnosis present

## 2019-06-07 DIAGNOSIS — J80 Acute respiratory distress syndrome: Secondary | ICD-10-CM | POA: Diagnosis not present

## 2019-06-07 DIAGNOSIS — D539 Nutritional anemia, unspecified: Secondary | ICD-10-CM | POA: Diagnosis present

## 2019-06-07 DIAGNOSIS — E872 Acidosis: Secondary | ICD-10-CM | POA: Diagnosis present

## 2019-06-07 DIAGNOSIS — R410 Disorientation, unspecified: Secondary | ICD-10-CM | POA: Diagnosis not present

## 2019-06-07 DIAGNOSIS — M2550 Pain in unspecified joint: Secondary | ICD-10-CM | POA: Diagnosis not present

## 2019-06-07 DIAGNOSIS — M316 Other giant cell arteritis: Secondary | ICD-10-CM | POA: Diagnosis not present

## 2019-06-07 DIAGNOSIS — Z7189 Other specified counseling: Secondary | ICD-10-CM | POA: Diagnosis not present

## 2019-06-07 DIAGNOSIS — I480 Paroxysmal atrial fibrillation: Secondary | ICD-10-CM | POA: Diagnosis not present

## 2019-06-07 DIAGNOSIS — Z66 Do not resuscitate: Secondary | ICD-10-CM | POA: Diagnosis not present

## 2019-06-07 DIAGNOSIS — I4892 Unspecified atrial flutter: Secondary | ICD-10-CM | POA: Diagnosis not present

## 2019-06-07 DIAGNOSIS — W19XXXD Unspecified fall, subsequent encounter: Secondary | ICD-10-CM

## 2019-06-07 DIAGNOSIS — I4891 Unspecified atrial fibrillation: Secondary | ICD-10-CM | POA: Diagnosis not present

## 2019-06-07 DIAGNOSIS — I4819 Other persistent atrial fibrillation: Secondary | ICD-10-CM | POA: Diagnosis not present

## 2019-06-07 DIAGNOSIS — R6510 Systemic inflammatory response syndrome (SIRS) of non-infectious origin without acute organ dysfunction: Secondary | ICD-10-CM | POA: Diagnosis not present

## 2019-06-07 DIAGNOSIS — H539 Unspecified visual disturbance: Secondary | ICD-10-CM | POA: Diagnosis not present

## 2019-06-07 DIAGNOSIS — E870 Hyperosmolality and hypernatremia: Secondary | ICD-10-CM | POA: Diagnosis not present

## 2019-06-07 DIAGNOSIS — N179 Acute kidney failure, unspecified: Secondary | ICD-10-CM | POA: Diagnosis present

## 2019-06-07 DIAGNOSIS — I483 Typical atrial flutter: Secondary | ICD-10-CM | POA: Diagnosis not present

## 2019-06-07 DIAGNOSIS — I5033 Acute on chronic diastolic (congestive) heart failure: Secondary | ICD-10-CM | POA: Diagnosis present

## 2019-06-07 DIAGNOSIS — D696 Thrombocytopenia, unspecified: Secondary | ICD-10-CM | POA: Diagnosis present

## 2019-06-07 DIAGNOSIS — R64 Cachexia: Secondary | ICD-10-CM | POA: Diagnosis present

## 2019-06-07 DIAGNOSIS — J810 Acute pulmonary edema: Secondary | ICD-10-CM | POA: Diagnosis not present

## 2019-06-07 DIAGNOSIS — I484 Atypical atrial flutter: Secondary | ICD-10-CM | POA: Diagnosis not present

## 2019-06-07 DIAGNOSIS — I10 Essential (primary) hypertension: Secondary | ICD-10-CM | POA: Diagnosis not present

## 2019-06-07 DIAGNOSIS — Z681 Body mass index (BMI) 19 or less, adult: Secondary | ICD-10-CM | POA: Diagnosis not present

## 2019-06-07 DIAGNOSIS — H9190 Unspecified hearing loss, unspecified ear: Secondary | ICD-10-CM | POA: Diagnosis present

## 2019-06-07 DIAGNOSIS — Z515 Encounter for palliative care: Secondary | ICD-10-CM | POA: Diagnosis not present

## 2019-06-07 DIAGNOSIS — E43 Unspecified severe protein-calorie malnutrition: Secondary | ICD-10-CM | POA: Diagnosis not present

## 2019-06-07 DIAGNOSIS — G9341 Metabolic encephalopathy: Secondary | ICD-10-CM | POA: Diagnosis present

## 2019-06-07 DIAGNOSIS — H9193 Unspecified hearing loss, bilateral: Secondary | ICD-10-CM | POA: Diagnosis not present

## 2019-06-07 DIAGNOSIS — J90 Pleural effusion, not elsewhere classified: Secondary | ICD-10-CM | POA: Diagnosis present

## 2019-06-07 DIAGNOSIS — J9601 Acute respiratory failure with hypoxia: Secondary | ICD-10-CM | POA: Diagnosis not present

## 2019-06-07 DIAGNOSIS — R404 Transient alteration of awareness: Secondary | ICD-10-CM | POA: Diagnosis not present

## 2019-06-07 DIAGNOSIS — R0902 Hypoxemia: Secondary | ICD-10-CM | POA: Diagnosis not present

## 2019-06-07 DIAGNOSIS — L89151 Pressure ulcer of sacral region, stage 1: Secondary | ICD-10-CM | POA: Diagnosis present

## 2019-06-07 DIAGNOSIS — I499 Cardiac arrhythmia, unspecified: Secondary | ICD-10-CM | POA: Diagnosis not present

## 2019-06-07 LAB — BASIC METABOLIC PANEL
Anion gap: 10 (ref 5–15)
BUN: 40 mg/dL — ABNORMAL HIGH (ref 8–23)
CO2: 24 mmol/L (ref 22–32)
Calcium: 8.8 mg/dL — ABNORMAL LOW (ref 8.9–10.3)
Chloride: 105 mmol/L (ref 98–111)
Creatinine, Ser: 0.85 mg/dL (ref 0.61–1.24)
GFR calc Af Amer: >60 mL/min (ref >60)
GFR calc non Af Amer: >60 mL/min (ref >60)
Glucose, Bld: 198 mg/dL — ABNORMAL HIGH (ref 70–99)
Potassium: 4.3 mmol/L (ref 3.5–5.1)
Sodium: 139 mmol/L (ref 135–145)

## 2019-06-07 LAB — CBC
HCT: 27 % — ABNORMAL LOW (ref 39.0–52.0)
Hemoglobin: 9 g/dL — ABNORMAL LOW (ref 13.0–17.0)
MCH: 33.8 pg (ref 26.0–34.0)
MCHC: 33.3 g/dL (ref 30.0–36.0)
MCV: 101.5 fL — ABNORMAL HIGH (ref 80.0–100.0)
Platelets: 110 10*3/uL — ABNORMAL LOW (ref 150–400)
RBC: 2.66 MIL/uL — ABNORMAL LOW (ref 4.22–5.81)
RDW: 20.3 % — ABNORMAL HIGH (ref 11.5–15.5)
WBC: 8.3 10*3/uL (ref 4.0–10.5)
nRBC: 0.7 % — ABNORMAL HIGH (ref 0.0–0.2)

## 2019-06-07 MED ORDER — AMIODARONE HCL 400 MG PO TABS: 400.0000 mg | ORAL_TABLET | Freq: Two times a day (BID) | ORAL | 1 refills | Status: AC

## 2019-06-07 MED ORDER — APIXABAN 2.5 MG PO TABS: 2.5000 mg | ORAL_TABLET | Freq: Two times a day (BID) | ORAL | 1 refills | Status: AC

## 2019-06-07 MED ORDER — THIAMINE HCL 100 MG PO TABS: 100.0000 mg | ORAL_TABLET | Freq: Every day | ORAL | Status: AC

## 2019-06-07 MED ORDER — METOPROLOL TARTRATE 100 MG PO TABS: 100.0000 mg | ORAL_TABLET | Freq: Two times a day (BID) | ORAL | 1 refills | Status: AC

## 2019-06-07 MED ORDER — SACCHAROMYCES BOULARDII 250 MG PO CAPS: 250.0000 mg | ORAL_CAPSULE | Freq: Two times a day (BID) | ORAL | Status: AC

## 2019-06-07 MED ORDER — AMOXICILLIN-POT CLAVULANATE 875-125 MG PO TABS: 1.0000 | ORAL_TABLET | Freq: Two times a day (BID) | ORAL | 0 refills | Status: AC

## 2019-06-07 MED ORDER — MAGNESIUM HYDROXIDE 400 MG/5ML PO SUSP
30.0000 mL | Freq: Once | ORAL | Status: AC
  Administered 2019-06-07: 30 mL via ORAL
  Filled 2019-06-07: qty 30

## 2019-06-07 MED ORDER — FOLIC ACID 1 MG PO TABS: 1.0000 mg | ORAL_TABLET | Freq: Every day | ORAL | Status: AC

## 2019-06-07 NOTE — Progress Notes (Signed)
PT Cancellation Note  Patient Details Name: CHA PRESS MRN: [id number] DOB: 25-Jun-1925   Cancelled Treatment:    Reason Eval/Treat Not Completed: Other (comment); patient and RN report SNF cancelled yesterday due to HR elevation after PT and wanting to d/c today to [facility].  Will hold off today in anticipation of transfer and PT initiated at SNF.    Reginia Naas 06/07/2019, 10:40 AM Magda Kiel, PT Acute Rehabilitation Services [phone] 06/07/2019

## 2019-06-07 NOTE — Progress Notes (Signed)
Patient is not seen CBC reviewed, platelets continues to improve Overall, his thrombocytopenia is likely due to bone marrow suppression from recent infection/antibiotics No further work-up is needed I will sign off. No follow-up with hematology is needed Please call if questions arise

## 2019-06-07 NOTE — Progress Notes (Signed)
Report called to [facility], pt going with [facility] with belongings, called daugther to notify her of [facility] here to transport him to [facility], will call his wife to let her know.

## 2019-06-07 NOTE — Progress Notes (Deleted)
Pt up to bedside commode attempting bowel movement.

## 2019-06-07 NOTE — TOC Transition Note (Addendum)
Transition of Care [facility]) - CM/SW Discharge Note   Patient Details  Name: Matthew Hickman MRN: [id number] Date of Birth: 02-Jun-1925  [facility]) CM/SW Contact:  Alberteen Sam, LCSW Phone Number: 06/07/2019, 12:17 PM   Clinical Narrative:     Patient will DC to: [facility] Anticipated DC date: 06/07/19 Family notified: [name] (niece) Transport [id number]  Per MD patient ready for DC to [facility] . RN, patient, patient's family, and facility notified of DC. Discharge Summary sent to facility. RN given number for report  [phone] Room 303B. DC packet on chart. Ambulance transport requested for patient.  CSW signing off.  [place], [place]  Final next level of care: Skilled Nursing Facility Barriers to Discharge: No Barriers Identified   Patient Goals and CMS Choice Patient states their goals for this hospitalization and ongoing recovery are:: to go to rehab CMS Medicare.gov Compare Post Acute Care list provided to:: Patient Choice offered to / list presented to : Patient  Discharge Placement PASRR number recieved: 06/05/19            Patient chooses bed at: [facility] Patient to be transferred to facility by: [facility] Name of family member notified: [name] (niece) Patient and family notified of of transfer: 06/07/19  Discharge Plan and Services     Post Acute Care Choice: [facility]                               Social Determinants of Health (SDOH) Interventions     Readmission Risk Interventions No flowsheet data found.

## 2019-06-07 NOTE — Plan of Care (Signed)

## 2019-06-07 NOTE — Plan of Care (Signed)
  Problem: Education: Goal: Knowledge of General Education information will improve Description: Including pain rating scale, medication(s)/side effects and non-pharmacologic comfort measures 06/07/2019 1054 by Don Perking, RN Outcome: Adequate for Discharge 06/07/2019 0831 by Don Perking, RN Outcome: Progressing   Problem: Health Behavior/Discharge Planning: Goal: Ability to manage health-related needs will improve 06/07/2019 1054 by Don Perking, RN Outcome: Adequate for Discharge 06/07/2019 0831 by Don Perking, RN Outcome: Progressing   Problem: Clinical Measurements: Goal: Ability to maintain clinical measurements within normal limits will improve 06/07/2019 1054 by Don Perking, RN Outcome: Adequate for Discharge 06/07/2019 0831 by Don Perking, RN Outcome: Progressing Goal: Will remain free from infection 06/07/2019 1054 by Don Perking, RN Outcome: Adequate for Discharge 06/07/2019 0831 by Don Perking, RN Outcome: Progressing Goal: Diagnostic test results will improve 06/07/2019 1054 by Don Perking, RN Outcome: Adequate for Discharge 06/07/2019 0831 by Don Perking, RN Outcome: Progressing Goal: Respiratory complications will improve 06/07/2019 1054 by Don Perking, RN Outcome: Adequate for Discharge 06/07/2019 0831 by Don Perking, RN Outcome: Progressing Goal: Cardiovascular complication will be avoided 06/07/2019 1054 by Don Perking, RN Outcome: Adequate for Discharge 06/07/2019 0831 by Don Perking, RN Outcome: Progressing   Problem: Activity: Goal: Risk for activity intolerance will decrease 06/07/2019 1054 by Don Perking, RN Outcome: Adequate for Discharge 06/07/2019 0831 by Don Perking, RN Outcome: Progressing   Problem: Nutrition: Goal: Adequate nutrition will be maintained 06/07/2019 1054 by Don Perking, RN Outcome: Adequate for Discharge 06/07/2019 0831 by Don Perking, RN Outcome: Progressing   Problem: Coping: Goal: Level of anxiety will decrease 06/07/2019 1054 by Don Perking, RN Outcome: Adequate for Discharge 06/07/2019 0831 by Don Perking, RN Outcome: Progressing   Problem: Elimination: Goal: Will not experience complications related to bowel motility 06/07/2019 1054 by Don Perking, RN Outcome: Adequate for Discharge 06/07/2019 0831 by Don Perking, RN Outcome: Progressing Goal: Will not experience complications related to urinary retention 06/07/2019 1054 by Don Perking, RN Outcome: Adequate for Discharge 06/07/2019 0831 by Don Perking, RN Outcome: Progressing   Problem: Pain Managment: Goal: General experience of comfort will improve 06/07/2019 1054 by Don Perking, RN Outcome: Adequate for Discharge 06/07/2019 0831 by Don Perking, RN Outcome: Progressing   Problem: Safety: Goal: Ability to remain free from injury will improve 06/07/2019 1054 by Don Perking, RN Outcome: Adequate for Discharge 06/07/2019 0831 by Don Perking, RN Outcome: Progressing   Problem: Skin Integrity: Goal: Risk for impaired skin integrity will decrease 06/07/2019 1054 by Don Perking, RN Outcome: Adequate for Discharge 06/07/2019 0831 by Don Perking, RN Outcome: Progressing  Pt going to [facility] for rehab

## 2019-06-07 NOTE — Discharge Summary (Signed)
Physician Discharge Summary  QUINNTIN MALTER VOZ:[id number] DOB: 26-Jun-1925 DOA: 05/29/2019  PCP: [name], MD  Admit date: 05/29/2019 Discharge date: 06/07/2019  Admitted From: Home Disposition: SNF  Recommendations for Outpatient Follow-up:  1. Follow up with PCP in 1-2 weeks 2. Please obtain BMP/CBC in one week 3. Please follow up with orthopedics as needed.     Discharge Condition: stable.  CODE STATUS: full code.  Diet recommendation: Heart Healthy   Brief/Interim Summary: REIN POPOV a 86 y.o.malewith medical history significant ofhypertension, recently diagnosed paroxysmal A. Fib, giant cell arteritis on chronic steroid, bilateral vision impairment, hearing impairment whopresented with a fall at home. CT head nonacute, in A. fib with rapid ventricular rate on presentation.  Cardiology was consulted. He was initially started on amiodarone gtt and transitioned to oral amiodarone and increased metoprolol.  Hospital course complicated by left forearm cellulitis with purulence for which he received 3 days of IV vancomycin.  Switched to Augmentin on 2019/07/08 after assessment by hand surgeon Dr. Mardelle Matte.  X-ray of forearm showed bone lucency, osteomyelitis was ruled out by MRI.  Revealed small subcutaneous abscess likely draining to the skin surface.  Recommendations for wound care with daily dressing changes and monitoring for progression. He was seen by hematology/oncology due to concern for MDS leading to thrombocytopenia.  No indication for bone marrow biopsy, monitoring for now, uptrending platelet count.   Discharge Diagnoses:  Active Problems:   A-fib (HCC)  A. flutter with RVR post TEE guided cardioversion on 06/02/19 by Dr. Gilman Schmidt Post cardioversion, in sinus rhythm with sinus arrhythmia, PACs. Diltiazem drip stopped on 06/01/19 Currently on p.o. amiodarone 400 mg twice daily, p.o. Lopressor dose increased today 100 mg twice daily.  Will monitor BP and HR on new  doses.  Maintain MAP >65 if possible. His heart rate still fluctuates up to the 130s. Off digoxin Continue Eliquis for CVA prevention Last 2D echo on 04/05/19> normal LVEF 55 to 60% with grade 1 diastolic dysfunction.   Left forearm purulent cellulitis, ruled out osteomyelitis by MRI. Osteomyelitis was ruled out by MRI.  However showed small subcutaneous abscess likely draining to the skin surface.  Seen by Hand surgery, Dr. Mardelle Matte recommends wound care and daily dressing changes Completed 3 days IV vancomycin then switched to Augmentin 5/2 to complete the course.   Resolved hypotensive, likely iatrogenic Blood pressure is stable Maintain MAP greater than 65  Pseudomonas aeruginosa UTI Presented with pyuria Urine culture growing 60,000 colonies of Pseudomonas aeruginosa, pansensitive.  Received 3 days of Rocephin and 5 days of oral ciprofloxacin..  Acute thrombocytopenia, improving/macrocytic anemia likely secondary to bone marrow suppression in the setting of acute illness. Platelets 110 K Hemoglobin 9 with MCV 101 Seen by hematology on 06/03/2019, no indication for bone marrow biopsy, less likely MDS.  Giant cell arteritis Continue prednisone, home regimen Continue PPI for GI prophylaxis  Moderate pulmonary hypertension Euvolemic on exam Management per cardiology's recommendation  BPH Continue Terazosin.   Monitor urine output No evidence of acute renal failure.  Physical debility PT OT assessment recommended SNF TOC consulted to assist with SNF placement. Continue PT OT with assistance and fall precautions. Patient has a bed at [facility] and will likely today.   History of alcohol use disorder No evidence of alcohol withdrawal at the time of this evaluation Continue multivitamins, thiamine and folate   Discharge Instructions   Allergies as of 06/07/2019      Reactions   Tape Other (See Comments)   PATIENT'S  SKIN IS THIN AND IT TEARS EASILY      Medication  List    STOP taking these medications   diltiazem 180 MG 24 hr capsule Commonly known as: CARDIZEM CD     TAKE these medications   amiodarone 400 MG tablet Commonly known as: PACERONE Take 1 tablet (400 mg total) by mouth 2 (two) times daily.   amoxicillin-clavulanate 875-125 MG tablet Commonly known as: AUGMENTIN Take 1 tablet by mouth every 12 (twelve) hours for 5 days.   apixaban 2.5 MG Tabs tablet Commonly known as: ELIQUIS Take 1 tablet (2.5 mg total) by mouth 2 (two) times daily.   feeding supplement (ENSURE ENLIVE) Liqd Take 237 mLs by mouth 3 (three) times daily between meals. What changed: when to take this   folic acid 1 MG tablet Commonly known as: FOLVITE Take 1 tablet (1 mg total) by mouth daily.   metoprolol tartrate 100 MG tablet Commonly known as: LOPRESSOR Take 1 tablet (100 mg total) by mouth 2 (two) times daily. What changed:   medication strength  how much to take   multivitamin with minerals Tabs tablet Take 1 tablet by mouth daily.   pantoprazole 20 MG tablet Commonly known as: PROTONIX Take 1 tablet (20 mg total) by mouth daily.   predniSONE 20 MG tablet Commonly known as: DELTASONE Take 20-40 mg by mouth See admin instructions. Taking 67m daily until 06-01-19, then  decrease to 273mdaily.   saccharomyces boulardii 250 MG capsule Commonly known as: FLORASTOR Take 1 capsule (250 mg total) by mouth 2 (two) times daily.   terazosin 1 MG capsule Commonly known as: HYTRIN Take 1 mg by mouth at bedtime.   thiamine 100 MG tablet Take 1 tablet (100 mg total) by mouth daily.      Follow-up Information    KiJani GravelMD. Schedule an appointment as soon as possible for a visit in 1 week(s).   Specialty: Internal Medicine Contact information: [id number] [phone]        CoSherren MochaMD .   Specialty: Cardiology Contact information: [phone]. [address] [place]  [phone]        Allergies  Allergen Reactions  . Tape Other (See Comments)    PATIENT'S SKIN IS THIN AND IT TEARS EASILY    Consultations:  Oncology     Procedures/Studies: DG Elbow Complete Left  Result Date: 05/29/2019 CLINICAL DATA:  Fall, laceration to left elbow EXAM: LEFT ELBOW - COMPLETE 3+ VIEW COMPARISON:  None. FINDINGS: There is no evidence of fracture, dislocation, or joint effusion. There is no evidence of arthropathy or other focal bone abnormality. Soft tissues are unremarkable. IMPRESSION: Negative. Electronically Signed   By: KeRolm Baptise.D.   On: 05/29/2019 10:54   DG Forearm Left  Result Date: 06/02/2019 CLINICAL DATA:  Swelling and tenderness.  oozing sores on forearm. EXAM: LEFT FOREARM - 2 VIEW COMPARISON:  None. FINDINGS: No fracture in the radius or ulna. A 7 mm lucency is identified in the anterior cortex of the radius, near the junction of the middle and distal thirds, indeterminate. No evidence for soft tissue gas. IMPRESSION: 7 mm indeterminate lucency in the anterior cortex of the mid radius. If the patient has a soft tissue sore in this region, MRI may be warranted to exclude osteomyelitis. Electronically Signed   By: ErMisty Stanley.D.   On: 06/02/2019 10:41   CT Head Wo Contrast  Result Date: 05/29/2019 CLINICAL DATA:  Head trauma.  Mechanical fall. EXAM: CT HEAD WITHOUT CONTRAST TECHNIQUE: Contiguous axial images were obtained from the base of the skull through the vertex without intravenous contrast. COMPARISON:  04/07/2019 MRI head. FINDINGS: Brain: No acute infarct or intracranial hemorrhage. Bilateral basal ganglia calcifications. Re-demonstration of small meningiomas overlying the right frontal and parietal convexities. No midline shift or extra-axial fluid collection. Vascular: No hyperdense vessel. Bilateral carotid siphon and V4 segment atherosclerotic calcifications. Skull: Negative for fracture or focal lesion. Sinuses/Orbits:  Bilateral lens replacement. No mastoid effusion. Small left maxillary sinus mucous retention cyst. Other: None. IMPRESSION: No acute intracranial process. Mild cerebral atrophy. Unchanged small right cerebral convexity meningiomas. Electronically Signed   By: Primitivo Gauze M.D.   On: 05/29/2019 10:57   MR FOREARM LEFT W WO CONTRAST  Result Date: 06/03/2019 CLINICAL DATA:  Mid radial lesion on radiography, for further characterization. EXAM: MRI OF THE LEFT FOREARM WITHOUT AND WITH CONTRAST TECHNIQUE: Multiplanar, multisequence MR imaging of the left forearm was performed before and after the administration of intravenous contrast. CONTRAST:  5.40m GADAVIST GADOBUTROL 1 MMOL/ML IV SOLN COMPARISON:  Radiographs from 06/02/2019 FINDINGS: Bones/Joint/Cartilage The small lucency in the radius has fatty signal in represents subtle endosteal scalloping possibly at a nutrient foramen. There is no abnormal enhancement in the bone or other specific worrisome bony characteristics, and the appearance is considered benign/incidental. No osteomyelitis is observed. Ligaments N/A Muscles and Tendons Field heterogeneity around the elbow and wrist which reduces sensitivity in assessing for edema. No intramuscular abscess is identified. Soft tissues Along the dorsal superficial fascial margin and subcutaneous tissues of the distal forearm, and in the marked location, there is abnormal enhancement around a irregular 3.5 by 0.8 by 4.0 cm (volume = 6 cm^3) fluid collection in the subcutaneous tissues which appears to be draining to the cutaneous surface on image 40/13. The appearance suggests a small subcutaneous abscess draining to the skin surface. Uncertain patency of a small overlying superficial vein. IMPRESSION: 1. Small subcutaneous abscess along the dorsal superficial fascial margin of the distal forearm and in the marked location. This appears to likely be draining to the skin surface. No underlying osteomyelitis or  intramuscular abscess. 2. Small lucency in the radius is considered benign/incidental, and represents a small incidental focus of endosteal scalloping. 3. Field heterogeneity around the elbow and wrist reduces sensitivity in assessing for in these regions. Electronically Signed   By: WVan ClinesM.D.   On: 06/03/2019 18:51   DG CHEST PORT 1 VIEW  Result Date: 06/06/2019 CLINICAL DATA:  Chest pain.  Shortness of breath. EXAM: PORTABLE CHEST 1 VIEW COMPARISON:  05/29/2019.  04/04/2019. FINDINGS: Cardiomegaly. Diffuse bilateral pulmonary interstitial prominence and bilateral pleural effusions. Findings suggest CHF. Pneumonitis cannot be excluded. Degenerative change thoracic spine. IMPRESSION: Cardiomegaly with diffuse bilateral interstitial prominence and bilateral pleural effusions suggesting CHF. Pneumonitis cannot be excluded. Electronically Signed   By: TMarcello Moores Register   On: 06/06/2019 13:56   DG CHEST PORT 1 VIEW  Result Date: 05/29/2019 CLINICAL DATA:  Pain following fall EXAM: PORTABLE CHEST 1 VIEW COMPARISON:  April 04, 2019 FINDINGS: There is atelectatic change in the left base. Lungs elsewhere are clear. Heart size and pulmonary vascularity are normal. No adenopathy. There is aortic atherosclerosis. No pneumothorax. No appreciable bone lesions. IMPRESSION: Left base atelectasis. Lungs elsewhere clear. Stable cardiac silhouette. No pneumothorax. Aortic Atherosclerosis (ICD10-I70.0). Electronically Signed   By: WLowella GripIII M.D.   On: 05/29/2019 13:27  ECHO TEE  Result Date: 06/02/2019    TRANSESOPHOGEAL ECHO REPORT   Patient Name:   Matthew Hickman Date of Exam: 06/02/2019 Medical Rec #:  [id number]      Height:       68.0 in Accession #:    [id number]     Weight:       124.6 lb Date of Birth:  25-Sep-1925      BSA:          1.671 m Patient Age:    86 years       BP:           114/55 mmHg Patient Gender: M              HR:           81 bpm. Exam Location:  Inpatient Procedure:  Transesophageal Echo, Cardiac Doppler and Color Doppler Indications:     Atrial fibrillation 427.31/I48.91  History:         Patient has prior history of Echocardiogram examinations, most                  recent 04/05/2019. Arrythmias:Atrial Flutter and Tachycardia;                  Risk Factors:Hypertension.  Sonographer:     Vikki Ports Turrentine Referring Phys:  [id number] Donato Heinz Diagnosing Phys: Oswaldo Milian MD PROCEDURE: The transesophogeal probe was passed without difficulty through the esophogus of the patient. Sedation performed by different physician. The patient was monitored while under deep sedation. Anesthestetic sedation was provided intravenously by Anesthesiology: 229m of Propofol. The patient developed no complications during the procedure. IMPRESSIONS  1. Left ventricular ejection fraction, by estimation, is 55 to 60%. The left ventricle has normal function. The left ventricle has no regional wall motion abnormalities.  2. Right ventricular systolic function is normal. The right ventricular size is normal.  3. The mitral valve is normal in structure. Mild mitral valve regurgitation.  4. The aortic valve is tricuspid. Aortic valve regurgitation is mild. No aortic stenosis is present.  5. Aortic dilatation noted. There is mild dilatation of the aortic root measuring 40 mm.  6. No left atrial/left atrial appendage thrombus was detected. FINDINGS  Left Ventricle: Left ventricular ejection fraction, by estimation, is 55 to 60%. The left ventricle has normal function. The left ventricle has no regional wall motion abnormalities. The left ventricular internal cavity size was normal in size. There is  no left ventricular hypertrophy. Right Ventricle: The right ventricular size is normal. Right vetricular wall thickness was not assessed. Right ventricular systolic function is normal. Left Atrium: Left atrial size was normal in size. No left atrial/left atrial appendage thrombus was  detected. Right Atrium: Right atrial size was normal in size. Pericardium: There is no evidence of pericardial effusion. Mitral Valve: The mitral valve is normal in structure. Mild mitral valve regurgitation. Tricuspid Valve: The tricuspid valve is normal in structure. Tricuspid valve regurgitation is mild. Aortic Valve: The aortic valve is tricuspid. Aortic valve regurgitation is mild. No aortic stenosis is present. Pulmonic Valve: The pulmonic valve was grossly normal. Pulmonic valve regurgitation is mild. Aorta: Aortic dilatation noted. There is mild dilatation of the aortic root measuring 40 mm. IAS/Shunts: No atrial level shunt detected by color flow Doppler. COswaldo MilianMD Electronically signed by COswaldo MilianMD Signature Date/Time: 06/02/2019/3:46:30 PM    Final      Subjective: No chest pain or sob, nausea or vomiting.  Discharge Exam: Vitals:   06/07/19 0751 06/07/19 0800  BP: 109/61 119/71  Pulse:  100  Resp: 17 17  Temp: (!) 97.4 F (36.3 C) (!) 97.4 F (36.3 C)  SpO2:  90%   Vitals:   06/07/19 0330 06/07/19 0500 06/07/19 0751 06/07/19 0800  BP: 97/72  109/61 119/71  Pulse: 76   100  Resp: 20  17 17   Temp: 97.7 F (36.5 C)  (!) 97.4 F (36.3 C) (!) 97.4 F (36.3 C)  TempSrc: Oral  Oral Oral  SpO2: 97%   90%  Weight:  56 kg    Height:        General: Pt is alert, awake, not in acute distress Cardiovascular: RRR, S1/S2 +, no rubs, no gallops Respiratory: CTA bilaterally, no wheezing, no rhonchi Abdominal: Soft, NT, ND, bowel sounds + Extremities: no edema, no cyanosis    The results of significant diagnostics from this hospitalization (including imaging, microbiology, ancillary and laboratory) are listed below for reference.     Microbiology: Recent Results (from the past 240 hour(s))  Respiratory Panel by RT PCR (Flu A&B, Covid) - Nasopharyngeal Swab     Status: None   Collection Time: 05/29/19  1:58 PM   Specimen: Nasopharyngeal Swab   Result Value Ref Range Status   SARS Coronavirus 2 by RT PCR NEGATIVE NEGATIVE Final    Comment: (NOTE) SARS-CoV-2 target nucleic acids are NOT DETECTED. The SARS-CoV-2 RNA is generally detectable in upper respiratoy specimens during the acute phase of infection. The lowest concentration of SARS-CoV-2 viral copies this assay can detect is 131 copies/mL. A negative result does not preclude SARS-Cov-2 infection and should not be used as the sole basis for treatment or other patient management decisions. A negative result may occur with  improper specimen collection/handling, submission of specimen other than nasopharyngeal swab, presence of viral mutation(s) within the areas targeted by this assay, and inadequate number of viral copies (<131 copies/mL). A negative result must be combined with clinical observations, patient history, and epidemiological information. The expected result is Negative. Fact Sheet for Patients:  PinkCheek.be Fact Sheet for Healthcare Providers:  GravelBags.it This test is not yet ap proved or cleared by the [place] FDA and  has been authorized for detection and/or diagnosis of SARS-CoV-2 by FDA under an Emergency Use Authorization (EUA). This EUA will remain  in effect (meaning this test can be used) for the duration of the COVID-19 declaration under Section 564(b)(1) of the Act, 21 U.S.C. section 360bbb-3(b)(1), unless the authorization is terminated or revoked sooner.    Influenza A by PCR NEGATIVE NEGATIVE Final   Influenza B by PCR NEGATIVE NEGATIVE Final    Comment: (NOTE) The Xpert Xpress SARS-CoV-2/FLU/RSV assay is intended as an aid in  the diagnosis of influenza from Nasopharyngeal swab specimens and  should not be used as a sole basis for treatment. Nasal washings and  aspirates are unacceptable for Xpert Xpress SARS-CoV-2/FLU/RSV  testing. Fact Sheet for  Patients: PinkCheek.be Fact Sheet for Healthcare Providers: GravelBags.it This test is not yet approved or cleared by the [place] FDA and  has been authorized for detection and/or diagnosis of SARS-CoV-2 by  FDA under an Emergency Use Authorization (EUA). This EUA will remain  in effect (meaning this test can be used) for the duration of the  Covid-19 declaration under Section 564(b)(1) of the Act, 21  U.S.C. section 360bbb-3(b)(1), unless the authorization is  terminated or revoked. Performed at [facility], [place]  [address], [place], [place] [address]   MRSA PCR Screening     Status: None   Collection Time: 05/30/19  5:43 AM   Specimen: Nasal Mucosa; Nasopharyngeal  Result Value Ref Range Status   MRSA by PCR NEGATIVE NEGATIVE Final    Comment:        The GeneXpert MRSA Assay (FDA approved for NASAL specimens only), is one component of a comprehensive MRSA colonization surveillance program. It is not intended to diagnose MRSA infection nor to guide or monitor treatment for MRSA infections. Performed at [facility], [place] [address], [place], [place] [address]   Culture, Urine     Status: Abnormal   Collection Time: 05/31/19  6:23 AM   Specimen: Urine, Random  Result Value Ref Range Status   Specimen Description URINE, RANDOM  Final   Special Requests   Final    NONE Performed at [facility], [place] [address] [place], [place] [address]    Culture 60,000 COLONIES/mL PSEUDOMONAS AERUGINOSA (A)  Final   Report Status 06/02/2019 FINAL  Final   Organism ID, Bacteria PSEUDOMONAS AERUGINOSA (A)  Final      Susceptibility   Pseudomonas aeruginosa - MIC*    CEFTAZIDIME 2 SENSITIVE Sensitive     CIPROFLOXACIN <=0.25 SENSITIVE Sensitive     GENTAMICIN 2 SENSITIVE Sensitive     IMIPENEM 2 SENSITIVE Sensitive     PIP/TAZO <=4 SENSITIVE Sensitive     CEFEPIME 4 SENSITIVE Sensitive     * 60,000  COLONIES/mL PSEUDOMONAS AERUGINOSA  SARS CORONAVIRUS 2 (TAT 6-24 HRS) Nasopharyngeal Nasopharyngeal Swab     Status: None   Collection Time: 06/06/19 10:12 AM   Specimen: Nasopharyngeal Swab  Result Value Ref Range Status   SARS Coronavirus 2 NEGATIVE NEGATIVE Final    Comment: (NOTE) SARS-CoV-2 target nucleic acids are NOT DETECTED. The SARS-CoV-2 RNA is generally detectable in upper and lower respiratory specimens during the acute phase of infection. Negative results do not preclude SARS-CoV-2 infection, do not rule out co-infections with other pathogens, and should not be used as the sole basis for treatment or other patient management decisions. Negative results must be combined with clinical observations, patient history, and epidemiological information. The expected result is Negative. Fact Sheet for Patients: SugarRoll.be Fact Sheet for Healthcare Providers: https://www.woods-mathews.com/ This test is not yet approved or cleared by the [place] FDA and  has been authorized for detection and/or diagnosis of SARS-CoV-2 by FDA under an Emergency Use Authorization (EUA). This EUA will remain  in effect (meaning this test can be used) for the duration of the COVID-19 declaration under Section 56 4(b)(1) of the Act, 21 U.S.C. section 360bbb-3(b)(1), unless the authorization is terminated or revoked sooner. Performed at [facility], [place] [address] [place], [place] [address]      Labs: BNP (last 3 results) No results for input(s): BNP in the last 8760 hours. Basic Metabolic Panel: Recent Labs  Lab 06/03/19 0155 06/04/19 0538 06/05/19 0245 06/06/19 0255 06/07/19 0302  NA 140 140 141 140 139  K 4.2 4.3 4.5 4.4 4.3  CL 104 106 105 106 105  CO2 27 26 28 25 24   GLUCOSE 175* 152* 174* 170* 198*  BUN 38* 37* 37* 38* 40*  CREATININE 0.73 0.83 0.84 0.77 0.85  CALCIUM 8.7* 8.6* 8.6* 8.7* 8.8*  MG  --  2.0  --   --   --     Liver Function Tests: No results for input(s): AST, ALT, ALKPHOS,  BILITOT, PROT, ALBUMIN in the last 168 hours. No results for input(s): LIPASE, AMYLASE in the last 168 hours. No results for input(s): AMMONIA in the last 168 hours. CBC: Recent Labs  Lab 06/03/19 0155 06/04/19 0244 06/05/19 0245 06/06/19 0255 06/07/19 0302  WBC 6.6 6.7 6.4 7.2 8.3  HGB 9.4* 9.3* 9.3* 9.3* 9.0*  HCT 27.8* 28.5* 28.6* 27.9* 27.0*  MCV 99.6 100.7* 101.4* 101.5* 101.5*  PLT 79* 87* 96* 99* 110*   Cardiac Enzymes: No results for input(s): CKTOTAL, CKMB, CKMBINDEX, TROPONINI in the last 168 hours. BNP: Invalid input(s): POCBNP CBG: No results for input(s): GLUCAP in the last 168 hours. D-Dimer No results for input(s): DDIMER in the last 72 hours. Hgb A1c No results for input(s): HGBA1C in the last 72 hours. Lipid Profile No results for input(s): CHOL, HDL, LDLCALC, TRIG, CHOLHDL, LDLDIRECT in the last 72 hours. Thyroid function studies No results for input(s): TSH, T4TOTAL, T3FREE, THYROIDAB in the last 72 hours.  Invalid input(s): FREET3 Anemia work up No results for input(s): VITAMINB12, FOLATE, FERRITIN, TIBC, IRON, RETICCTPCT in the last 72 hours. Urinalysis    Component Value Date/Time   COLORURINE YELLOW 05/29/2019 1211   APPEARANCEUR HAZY (A) 05/29/2019 1211   LABSPEC 1.015 05/29/2019 1211   PHURINE 7.0 05/29/2019 1211   GLUCOSEU NEGATIVE 05/29/2019 1211   HGBUR SMALL (A) 05/29/2019 1211   BILIRUBINUR NEGATIVE 05/29/2019 1211   KETONESUR NEGATIVE 05/29/2019 1211   PROTEINUR 30 (A) 05/29/2019 1211   UROBILINOGEN 1.0 09/20/2010 1226   NITRITE NEGATIVE 05/29/2019 1211   LEUKOCYTESUR LARGE (A) 05/29/2019 1211   Sepsis Labs Invalid input(s): PROCALCITONIN,  WBC,  LACTICIDVEN Microbiology Recent Results (from the past 240 hour(s))  Respiratory Panel by RT PCR (Flu A&B, Covid) - Nasopharyngeal Swab     Status: None   Collection Time: 05/29/19  1:58 PM   Specimen: Nasopharyngeal  Swab  Result Value Ref Range Status   SARS Coronavirus 2 by RT PCR NEGATIVE NEGATIVE Final    Comment: (NOTE) SARS-CoV-2 target nucleic acids are NOT DETECTED. The SARS-CoV-2 RNA is generally detectable in upper respiratoy specimens during the acute phase of infection. The lowest concentration of SARS-CoV-2 viral copies this assay can detect is 131 copies/mL. A negative result does not preclude SARS-Cov-2 infection and should not be used as the sole basis for treatment or other patient management decisions. A negative result may occur with  improper specimen collection/handling, submission of specimen other than nasopharyngeal swab, presence of viral mutation(s) within the areas targeted by this assay, and inadequate number of viral copies (<131 copies/mL). A negative result must be combined with clinical observations, patient history, and epidemiological information. The expected result is Negative. Fact Sheet for Patients:  PinkCheek.be Fact Sheet for Healthcare Providers:  GravelBags.it This test is not yet ap proved or cleared by the [place] FDA and  has been authorized for detection and/or diagnosis of SARS-CoV-2 by FDA under an Emergency Use Authorization (EUA). This EUA will remain  in effect (meaning this test can be used) for the duration of the COVID-19 declaration under Section 564(b)(1) of the Act, 21 U.S.C. section 360bbb-3(b)(1), unless the authorization is terminated or revoked sooner.    Influenza A by PCR NEGATIVE NEGATIVE Final   Influenza B by PCR NEGATIVE NEGATIVE Final    Comment: (NOTE) The Xpert Xpress SARS-CoV-2/FLU/RSV assay is intended as an aid in  the diagnosis of influenza from Nasopharyngeal swab specimens and  should not be used as a sole basis for  treatment. Nasal washings and  aspirates are unacceptable for Xpert Xpress SARS-CoV-2/FLU/RSV  testing. Fact Sheet for  Patients: PinkCheek.be Fact Sheet for Healthcare Providers: GravelBags.it This test is not yet approved or cleared by the [place] FDA and  has been authorized for detection and/or diagnosis of SARS-CoV-2 by  FDA under an Emergency Use Authorization (EUA). This EUA will remain  in effect (meaning this test can be used) for the duration of the  Covid-19 declaration under Section 564(b)(1) of the Act, 21  U.S.C. section 360bbb-3(b)(1), unless the authorization is  terminated or revoked. Performed at [facility], [place] [address], [place], [place] [address]   MRSA PCR Screening     Status: None   Collection Time: 05/30/19  5:43 AM   Specimen: Nasal Mucosa; Nasopharyngeal  Result Value Ref Range Status   MRSA by PCR NEGATIVE NEGATIVE Final    Comment:        The GeneXpert MRSA Assay (FDA approved for NASAL specimens only), is one component of a comprehensive MRSA colonization surveillance program. It is not intended to diagnose MRSA infection nor to guide or monitor treatment for MRSA infections. Performed at [facility], [place] [address] [place], [place] [address]   Culture, Urine     Status: Abnormal   Collection Time: 05/31/19  6:23 AM   Specimen: Urine, Random  Result Value Ref Range Status   Specimen Description URINE, RANDOM  Final   Special Requests   Final    NONE Performed at [facility], [place] [address] [place], [place] [address]    Culture 60,000 COLONIES/mL PSEUDOMONAS AERUGINOSA (A)  Final   Report Status 06/02/2019 FINAL  Final   Organism ID, Bacteria PSEUDOMONAS AERUGINOSA (A)  Final      Susceptibility   Pseudomonas aeruginosa - MIC*    CEFTAZIDIME 2 SENSITIVE Sensitive     CIPROFLOXACIN <=0.25 SENSITIVE Sensitive     GENTAMICIN 2 SENSITIVE Sensitive     IMIPENEM 2 SENSITIVE Sensitive     PIP/TAZO <=4 SENSITIVE Sensitive     CEFEPIME 4 SENSITIVE Sensitive     * 60,000  COLONIES/mL PSEUDOMONAS AERUGINOSA  SARS CORONAVIRUS 2 (TAT 6-24 HRS) Nasopharyngeal Nasopharyngeal Swab     Status: None   Collection Time: 06/06/19 10:12 AM   Specimen: Nasopharyngeal Swab  Result Value Ref Range Status   SARS Coronavirus 2 NEGATIVE NEGATIVE Final    Comment: (NOTE) SARS-CoV-2 target nucleic acids are NOT DETECTED. The SARS-CoV-2 RNA is generally detectable in upper and lower respiratory specimens during the acute phase of infection. Negative results do not preclude SARS-CoV-2 infection, do not rule out co-infections with other pathogens, and should not be used as the sole basis for treatment or other patient management decisions. Negative results must be combined with clinical observations, patient history, and epidemiological information. The expected result is Negative. Fact Sheet for Patients: SugarRoll.be Fact Sheet for Healthcare Providers: https://www.woods-mathews.com/ This test is not yet approved or cleared by the [place] FDA and  has been authorized for detection and/or diagnosis of SARS-CoV-2 by FDA under an Emergency Use Authorization (EUA). This EUA will remain  in effect (meaning this test can be used) for the duration of the COVID-19 declaration under Section 56 4(b)(1) of the Act, 21 U.S.C. section 360bbb-3(b)(1), unless the authorization is terminated or revoked sooner. Performed at [facility], [place] [address], [place], [place] [address]      Time coordinating discharge: 32 minutes  SIGNED:  Hosie Poisson, MD  [facility] 06/07/2019, 11:04 AM

## 2019-06-07 NOTE — Progress Notes (Signed)
   06/07/19 1123  Assess: MEWS Score  Temp (!) 97.3 F (36.3 C)  BP (!) 100/57  ECG Heart Rate (!) 132  Resp (!) 24  Assess: MEWS Score  MEWS Temp 0  MEWS Systolic 1  MEWS Pulse 3  MEWS RR 1  MEWS LOC 0  MEWS Score 5  MEWS Score Color Red  Assess: if the MEWS score is Yellow or Red  Were vital signs taken at a resting state? No  Focused Assessment Documented focused assessment  Early Detection of Sepsis Score *See Row Information* Low  MEWS guidelines implemented *See Row Information* No, other (Comment)  Document  Patient Outcome Other (Comment) (pt having a BM,)   Pt up to [facility], attempting to have BM.

## 2019-06-10 ENCOUNTER — Emergency Department (HOSPITAL_COMMUNITY): Payer: Medicare Other

## 2019-06-10 ENCOUNTER — Encounter (HOSPITAL_COMMUNITY): Payer: Self-pay | Admitting: Emergency Medicine

## 2019-06-10 ENCOUNTER — Inpatient Hospital Stay (HOSPITAL_COMMUNITY)
Admission: EM | Admit: 2019-06-10 | Discharge: 2019-07-04 | DRG: 291 | Disposition: E | Payer: Medicare Other | Source: Skilled Nursing Facility | Attending: Pulmonary Disease | Admitting: Pulmonary Disease

## 2019-06-10 ENCOUNTER — Other Ambulatory Visit: Payer: Self-pay

## 2019-06-10 DIAGNOSIS — Z66 Do not resuscitate: Secondary | ICD-10-CM | POA: Diagnosis not present

## 2019-06-10 DIAGNOSIS — J9601 Acute respiratory failure with hypoxia: Secondary | ICD-10-CM

## 2019-06-10 DIAGNOSIS — J9690 Respiratory failure, unspecified, unspecified whether with hypoxia or hypercapnia: Secondary | ICD-10-CM | POA: Diagnosis present

## 2019-06-10 DIAGNOSIS — E872 Acidosis: Secondary | ICD-10-CM | POA: Diagnosis present

## 2019-06-10 DIAGNOSIS — L89151 Pressure ulcer of sacral region, stage 1: Secondary | ICD-10-CM | POA: Diagnosis present

## 2019-06-10 DIAGNOSIS — J810 Acute pulmonary edema: Secondary | ICD-10-CM | POA: Diagnosis not present

## 2019-06-10 DIAGNOSIS — R0902 Hypoxemia: Secondary | ICD-10-CM | POA: Diagnosis not present

## 2019-06-10 DIAGNOSIS — H9190 Unspecified hearing loss, unspecified ear: Secondary | ICD-10-CM | POA: Diagnosis present

## 2019-06-10 DIAGNOSIS — D696 Thrombocytopenia, unspecified: Secondary | ICD-10-CM | POA: Diagnosis present

## 2019-06-10 DIAGNOSIS — Z515 Encounter for palliative care: Secondary | ICD-10-CM | POA: Diagnosis not present

## 2019-06-10 DIAGNOSIS — I483 Typical atrial flutter: Secondary | ICD-10-CM | POA: Diagnosis not present

## 2019-06-10 DIAGNOSIS — Z20822 Contact with and (suspected) exposure to covid-19: Secondary | ICD-10-CM | POA: Diagnosis present

## 2019-06-10 DIAGNOSIS — I10 Essential (primary) hypertension: Secondary | ICD-10-CM | POA: Diagnosis not present

## 2019-06-10 DIAGNOSIS — L03114 Cellulitis of left upper limb: Secondary | ICD-10-CM | POA: Diagnosis not present

## 2019-06-10 DIAGNOSIS — R54 Age-related physical debility: Secondary | ICD-10-CM | POA: Diagnosis present

## 2019-06-10 DIAGNOSIS — Z9109 Other allergy status, other than to drugs and biological substances: Secondary | ICD-10-CM

## 2019-06-10 DIAGNOSIS — I5033 Acute on chronic diastolic (congestive) heart failure: Secondary | ICD-10-CM | POA: Diagnosis present

## 2019-06-10 DIAGNOSIS — R6510 Systemic inflammatory response syndrome (SIRS) of non-infectious origin without acute organ dysfunction: Secondary | ICD-10-CM | POA: Insufficient documentation

## 2019-06-10 DIAGNOSIS — Z8249 Family history of ischemic heart disease and other diseases of the circulatory system: Secondary | ICD-10-CM

## 2019-06-10 DIAGNOSIS — R404 Transient alteration of awareness: Secondary | ICD-10-CM | POA: Diagnosis not present

## 2019-06-10 DIAGNOSIS — I110 Hypertensive heart disease with heart failure: Principal | ICD-10-CM | POA: Diagnosis present

## 2019-06-10 DIAGNOSIS — I484 Atypical atrial flutter: Secondary | ICD-10-CM

## 2019-06-10 DIAGNOSIS — I499 Cardiac arrhythmia, unspecified: Secondary | ICD-10-CM | POA: Diagnosis not present

## 2019-06-10 DIAGNOSIS — I272 Pulmonary hypertension, unspecified: Secondary | ICD-10-CM | POA: Diagnosis present

## 2019-06-10 DIAGNOSIS — Z7901 Long term (current) use of anticoagulants: Secondary | ICD-10-CM

## 2019-06-10 DIAGNOSIS — M316 Other giant cell arteritis: Secondary | ICD-10-CM | POA: Diagnosis not present

## 2019-06-10 DIAGNOSIS — Z87440 Personal history of urinary (tract) infections: Secondary | ICD-10-CM

## 2019-06-10 DIAGNOSIS — I480 Paroxysmal atrial fibrillation: Secondary | ICD-10-CM | POA: Diagnosis not present

## 2019-06-10 DIAGNOSIS — G9341 Metabolic encephalopathy: Secondary | ICD-10-CM | POA: Diagnosis present

## 2019-06-10 DIAGNOSIS — E43 Unspecified severe protein-calorie malnutrition: Secondary | ICD-10-CM | POA: Diagnosis present

## 2019-06-10 DIAGNOSIS — I4819 Other persistent atrial fibrillation: Secondary | ICD-10-CM | POA: Diagnosis present

## 2019-06-10 DIAGNOSIS — W19XXXD Unspecified fall, subsequent encounter: Secondary | ICD-10-CM | POA: Diagnosis not present

## 2019-06-10 DIAGNOSIS — J80 Acute respiratory distress syndrome: Secondary | ICD-10-CM | POA: Diagnosis not present

## 2019-06-10 DIAGNOSIS — Z681 Body mass index (BMI) 19 or less, adult: Secondary | ICD-10-CM

## 2019-06-10 DIAGNOSIS — E870 Hyperosmolality and hypernatremia: Secondary | ICD-10-CM | POA: Diagnosis not present

## 2019-06-10 DIAGNOSIS — Z79899 Other long term (current) drug therapy: Secondary | ICD-10-CM

## 2019-06-10 DIAGNOSIS — R0602 Shortness of breath: Secondary | ICD-10-CM | POA: Diagnosis not present

## 2019-06-10 DIAGNOSIS — J90 Pleural effusion, not elsewhere classified: Secondary | ICD-10-CM | POA: Diagnosis present

## 2019-06-10 DIAGNOSIS — Z7189 Other specified counseling: Secondary | ICD-10-CM

## 2019-06-10 DIAGNOSIS — Z7952 Long term (current) use of systemic steroids: Secondary | ICD-10-CM

## 2019-06-10 DIAGNOSIS — I088 Other rheumatic multiple valve diseases: Secondary | ICD-10-CM | POA: Diagnosis present

## 2019-06-10 DIAGNOSIS — R64 Cachexia: Secondary | ICD-10-CM | POA: Diagnosis present

## 2019-06-10 DIAGNOSIS — N179 Acute kidney failure, unspecified: Secondary | ICD-10-CM | POA: Diagnosis present

## 2019-06-10 DIAGNOSIS — H539 Unspecified visual disturbance: Secondary | ICD-10-CM | POA: Diagnosis not present

## 2019-06-10 DIAGNOSIS — I959 Hypotension, unspecified: Secondary | ICD-10-CM | POA: Diagnosis present

## 2019-06-10 DIAGNOSIS — D539 Nutritional anemia, unspecified: Secondary | ICD-10-CM | POA: Diagnosis present

## 2019-06-10 DIAGNOSIS — I4891 Unspecified atrial fibrillation: Secondary | ICD-10-CM | POA: Diagnosis not present

## 2019-06-10 DIAGNOSIS — I4892 Unspecified atrial flutter: Secondary | ICD-10-CM | POA: Diagnosis not present

## 2019-06-10 DIAGNOSIS — L8990 Pressure ulcer of unspecified site, unspecified stage: Secondary | ICD-10-CM | POA: Insufficient documentation

## 2019-06-10 LAB — TROPONIN I (HIGH SENSITIVITY)
Troponin I (High Sensitivity): 60 ng/L — ABNORMAL HIGH (ref <18)
Troponin I (High Sensitivity): 54 ng/L — ABNORMAL HIGH (ref <18)

## 2019-06-10 LAB — CBC WITH DIFFERENTIAL/PLATELET
Abs Immature Granulocytes: 0.59 10*3/uL — ABNORMAL HIGH (ref 0.00–0.07)
Basophils Absolute: 0 10*3/uL (ref 0.0–0.1)
Basophils Relative: 0 %
Eosinophils Absolute: 0 10*3/uL (ref 0.0–0.5)
Eosinophils Relative: 0 %
HCT: 27.4 % — ABNORMAL LOW (ref 39.0–52.0)
Hemoglobin: 8.7 g/dL — ABNORMAL LOW (ref 13.0–17.0)
Immature Granulocytes: 6 %
Lymphocytes Relative: 7 %
Lymphs Abs: 0.7 10*3/uL (ref 0.7–4.0)
MCH: 33.5 pg (ref 26.0–34.0)
MCHC: 31.8 g/dL (ref 30.0–36.0)
MCV: 105.4 fL — ABNORMAL HIGH (ref 80.0–100.0)
Monocytes Absolute: 0.3 10*3/uL (ref 0.1–1.0)
Monocytes Relative: 3 %
Neutro Abs: 8.9 10*3/uL — ABNORMAL HIGH (ref 1.7–7.7)
Neutrophils Relative %: 84 %
Platelets: 105 10*3/uL — ABNORMAL LOW (ref 150–400)
RBC: 2.6 MIL/uL — ABNORMAL LOW (ref 4.22–5.81)
RDW: 21.5 % — ABNORMAL HIGH (ref 11.5–15.5)
WBC: 10.5 10*3/uL (ref 4.0–10.5)
nRBC: 1.1 % — ABNORMAL HIGH (ref 0.0–0.2)

## 2019-06-10 LAB — URINALYSIS, ROUTINE W REFLEX MICROSCOPIC
Bilirubin Urine: NEGATIVE
Glucose, UA: NEGATIVE mg/dL
Ketones, ur: NEGATIVE mg/dL
Nitrite: NEGATIVE
Protein, ur: NEGATIVE mg/dL
Specific Gravity, Urine: 1.021 (ref 1.005–1.030)
pH: 5 (ref 5.0–8.0)

## 2019-06-10 LAB — COMPREHENSIVE METABOLIC PANEL
ALT: 52 U/L — ABNORMAL HIGH (ref 0–44)
AST: 60 U/L — ABNORMAL HIGH (ref 15–41)
Albumin: 2 g/dL — ABNORMAL LOW (ref 3.5–5.0)
Alkaline Phosphatase: 94 U/L (ref 38–126)
Anion gap: 13 (ref 5–15)
BUN: 65 mg/dL — ABNORMAL HIGH (ref 8–23)
CO2: 24 mmol/L (ref 22–32)
Calcium: 8.3 mg/dL — ABNORMAL LOW (ref 8.9–10.3)
Chloride: 107 mmol/L (ref 98–111)
Creatinine, Ser: 1.2 mg/dL (ref 0.61–1.24)
GFR calc Af Amer: >60 mL/min (ref >60)
GFR calc non Af Amer: 53 mL/min — ABNORMAL LOW (ref >60)
Glucose, Bld: 113 mg/dL — ABNORMAL HIGH (ref 70–99)
Potassium: 4.5 mmol/L (ref 3.5–5.1)
Sodium: 144 mmol/L (ref 135–145)
Total Bilirubin: 1.3 mg/dL — ABNORMAL HIGH (ref 0.3–1.2)
Total Protein: 4.3 g/dL — ABNORMAL LOW (ref 6.5–8.1)

## 2019-06-10 LAB — CBC
HCT: 23.3 % — ABNORMAL LOW (ref 39.0–52.0)
Hemoglobin: 7.4 g/dL — ABNORMAL LOW (ref 13.0–17.0)
MCH: 33.9 pg (ref 26.0–34.0)
MCHC: 31.8 g/dL (ref 30.0–36.0)
MCV: 106.9 fL — ABNORMAL HIGH (ref 80.0–100.0)
Platelets: 100 10*3/uL — ABNORMAL LOW (ref 150–400)
RBC: 2.18 MIL/uL — ABNORMAL LOW (ref 4.22–5.81)
RDW: 21.4 % — ABNORMAL HIGH (ref 11.5–15.5)
WBC: 13.9 10*3/uL — ABNORMAL HIGH (ref 4.0–10.5)
nRBC: 0.5 % — ABNORMAL HIGH (ref 0.0–0.2)

## 2019-06-10 LAB — POCT I-STAT 7, (LYTES, BLD GAS, ICA,H+H)
Acid-Base Excess: 3 mmol/L — ABNORMAL HIGH (ref 0.0–2.0)
Bicarbonate: 25.3 mmol/L (ref 20.0–28.0)
Calcium, Ion: 1.22 mmol/L (ref 1.15–1.40)
HCT: 18 % — ABNORMAL LOW (ref 39.0–52.0)
Hemoglobin: 6.1 g/dL — CL (ref 13.0–17.0)
O2 Saturation: 95 %
Patient temperature: 97.9
Potassium: 4 mmol/L (ref 3.5–5.1)
Sodium: 142 mmol/L (ref 135–145)
TCO2: 26 mmol/L (ref 22–32)
pCO2 arterial: 27.4 mmHg — ABNORMAL LOW (ref 32.0–48.0)
pH, Arterial: 7.571 — ABNORMAL HIGH (ref 7.350–7.450)
pO2, Arterial: 63 mmHg — ABNORMAL LOW (ref 83.0–108.0)

## 2019-06-10 LAB — HEMOGLOBIN A1C
Hgb A1c MFr Bld: 6.4 % — ABNORMAL HIGH (ref 4.8–5.6)
Mean Plasma Glucose: 136.98 mg/dL

## 2019-06-10 LAB — GLUCOSE, CAPILLARY
Glucose-Capillary: 110 mg/dL — ABNORMAL HIGH (ref 70–99)
Glucose-Capillary: 144 mg/dL — ABNORMAL HIGH (ref 70–99)

## 2019-06-10 LAB — RESPIRATORY PANEL BY RT PCR (FLU A&B, COVID)
Influenza A by PCR: NEGATIVE
Influenza B by PCR: NEGATIVE
SARS Coronavirus 2 by RT PCR: NEGATIVE

## 2019-06-10 LAB — LACTIC ACID, PLASMA
Lactic Acid, Venous: 2.1 mmol/L (ref 0.5–1.9)
Lactic Acid, Venous: 3.6 mmol/L (ref 0.5–1.9)

## 2019-06-10 LAB — LACTATE DEHYDROGENASE: LDH: 336 U/L — ABNORMAL HIGH (ref 98–192)

## 2019-06-10 LAB — BRAIN NATRIURETIC PEPTIDE: B Natriuretic Peptide: 127 pg/mL — ABNORMAL HIGH (ref 0.0–100.0)

## 2019-06-10 LAB — PROCALCITONIN: Procalcitonin: 0.32 ng/mL

## 2019-06-10 LAB — ABO/RH: ABO/RH(D): O NEG

## 2019-06-10 LAB — PROTIME-INR
INR: 2.2 — ABNORMAL HIGH (ref 0.8–1.2)
Prothrombin Time: 23.8 seconds — ABNORMAL HIGH (ref 11.4–15.2)

## 2019-06-10 LAB — APTT: aPTT: 33 seconds (ref 24–36)

## 2019-06-10 LAB — MRSA PCR SCREENING: MRSA by PCR: NEGATIVE

## 2019-06-10 MED ORDER — ATOVAQUONE 750 MG/5ML PO SUSP: 1500.0000 mg | Freq: Every day | ORAL | Status: DC

## 2019-06-10 MED ORDER — GLUCAGON HCL RDNA (DIAGNOSTIC) 1 MG IJ SOLR
1.0000 mg | Freq: Once | INTRAMUSCULAR | Status: DC
  Administered 2019-06-10: 1 mg via INTRAVENOUS

## 2019-06-10 MED ORDER — LACTATED RINGERS IV BOLUS
500.0000 mL | Freq: Once | INTRAVENOUS | Status: AC
  Administered 2019-06-10: 500 mL via INTRAVENOUS

## 2019-06-10 MED ORDER — SODIUM CHLORIDE 0.9 % IV SOLN
2.0000 g | Freq: Two times a day (BID) | INTRAVENOUS | Status: DC
  Administered 2019-06-10: 2 g via INTRAVENOUS
  Filled 2019-06-10: qty 2

## 2019-06-10 MED ORDER — VANCOMYCIN HCL IN DEXTROSE 1-5 GM/200ML-% IV SOLN
1000.0000 mg | Freq: Once | INTRAVENOUS | Status: AC
  Administered 2019-06-10: 1000 mg via INTRAVENOUS
  Filled 2019-06-10: qty 200

## 2019-06-10 MED ORDER — ATOVAQUONE 750 MG/5ML PO SUSP
750.0000 mg | Freq: Two times a day (BID) | ORAL | Status: DC
  Filled 2019-06-10 (×3): qty 5

## 2019-06-10 MED ORDER — METRONIDAZOLE IN NACL 5-0.79 MG/ML-% IV SOLN
500.0000 mg | Freq: Once | INTRAVENOUS | Status: AC
  Administered 2019-06-10: 500 mg via INTRAVENOUS
  Filled 2019-06-10: qty 100

## 2019-06-10 MED ORDER — HYDROCORTISONE NA SUCCINATE PF 100 MG IJ SOLR
50.0000 mg | Freq: Four times a day (QID) | INTRAMUSCULAR | Status: DC
  Administered 2019-06-10: 50 mg via INTRAVENOUS
  Filled 2019-06-10: qty 2

## 2019-06-10 MED ORDER — ORAL CARE MOUTH RINSE
15.0000 mL | Freq: Two times a day (BID) | OROMUCOSAL | Status: DC
  Administered 2019-06-10 – 2019-06-13 (×7): 15 mL via OROMUCOSAL

## 2019-06-10 MED ORDER — GLUCAGON HCL RDNA (DIAGNOSTIC) 1 MG IJ SOLR
2.0000 mg | Freq: Once | INTRAMUSCULAR | Status: AC
  Administered 2019-06-10: 2 mg via INTRAVENOUS
  Filled 2019-06-10: qty 2

## 2019-06-10 MED ORDER — PANTOPRAZOLE SODIUM 20 MG PO TBEC
20.0000 mg | DELAYED_RELEASE_TABLET | Freq: Every day | ORAL | Status: DC
  Filled 2019-06-10 (×2): qty 1

## 2019-06-10 MED ORDER — METHYLPREDNISOLONE SODIUM SUCC 125 MG IJ SOLR
60.0000 mg | Freq: Two times a day (BID) | INTRAMUSCULAR | Status: DC
  Administered 2019-06-10 – 2019-06-11 (×3): 60 mg via INTRAVENOUS
  Filled 2019-06-10 (×3): qty 2

## 2019-06-10 MED ORDER — AMIODARONE HCL 200 MG PO TABS: 400.0000 mg | ORAL_TABLET | Freq: Two times a day (BID) | ORAL | Status: DC

## 2019-06-10 MED ORDER — SODIUM CHLORIDE 0.9 % IV SOLN
2.0000 g | Freq: Once | INTRAVENOUS | Status: AC
  Administered 2019-06-10: 2 g via INTRAVENOUS
  Filled 2019-06-10: qty 2

## 2019-06-10 MED ORDER — APIXABAN 2.5 MG PO TABS
2.5000 mg | ORAL_TABLET | Freq: Two times a day (BID) | ORAL | Status: DC
  Filled 2019-06-10: qty 1

## 2019-06-10 MED ORDER — FUROSEMIDE 10 MG/ML IJ SOLN: 40.0000 mg | Freq: Once | INTRAMUSCULAR | Status: DC

## 2019-06-10 MED ORDER — ATOVAQUONE 750 MG/5ML PO SUSP
750.0000 mg | Freq: Three times a day (TID) | ORAL | Status: DC
  Filled 2019-06-10: qty 5

## 2019-06-10 MED ORDER — SODIUM CHLORIDE 0.9 % IV SOLN
INTRAVENOUS | Status: DC | PRN
  Administered 2019-06-10: 250 mL via INTRAVENOUS

## 2019-06-10 MED ORDER — ALBUMIN HUMAN 25 % IV SOLN
25.0000 g | Freq: Four times a day (QID) | INTRAVENOUS | Status: AC
  Administered 2019-06-10 – 2019-06-11 (×4): 25 g via INTRAVENOUS
  Filled 2019-06-10 (×4): qty 100

## 2019-06-10 MED ORDER — CHLORHEXIDINE GLUCONATE 0.12 % MT SOLN
15.0000 mL | Freq: Two times a day (BID) | OROMUCOSAL | Status: DC
  Administered 2019-06-10 – 2019-06-14 (×8): 15 mL via OROMUCOSAL
  Filled 2019-06-10 (×5): qty 15

## 2019-06-10 MED ORDER — DOCUSATE SODIUM 100 MG PO CAPS: 100.0000 mg | ORAL_CAPSULE | Freq: Two times a day (BID) | ORAL | Status: DC | PRN

## 2019-06-10 MED ORDER — VANCOMYCIN HCL IN DEXTROSE 1-5 GM/200ML-% IV SOLN: 1000.0000 mg | INTRAVENOUS | Status: DC

## 2019-06-10 MED ORDER — ENOXAPARIN SODIUM 60 MG/0.6ML ~~LOC~~ SOLN
50.0000 mg | Freq: Two times a day (BID) | SUBCUTANEOUS | Status: DC
  Administered 2019-06-10 – 2019-06-13 (×6): 50 mg via SUBCUTANEOUS
  Filled 2019-06-10 (×7): qty 0.5

## 2019-06-10 MED ORDER — CHLORHEXIDINE GLUCONATE CLOTH 2 % EX PADS
6.0000 | MEDICATED_PAD | Freq: Every day | CUTANEOUS | Status: DC
  Administered 2019-06-10 – 2019-06-14 (×5): 6 via TOPICAL

## 2019-06-10 MED ORDER — POLYETHYLENE GLYCOL 3350 17 G PO PACK: 17.0000 g | PACK | Freq: Every day | ORAL | Status: DC | PRN

## 2019-06-10 MED ORDER — ALBUTEROL SULFATE (2.5 MG/3ML) 0.083% IN NEBU: 2.5000 mg | INHALATION_SOLUTION | RESPIRATORY_TRACT | Status: DC | PRN

## 2019-06-10 NOTE — Progress Notes (Signed)
Repeat lactate 3.6

## 2019-06-10 NOTE — ED Notes (Signed)
No fluid boluses per Dr Randal Buba at this time due to patient being in pulmonary edema.

## 2019-06-10 NOTE — Progress Notes (Signed)
Pharmacy Antibiotic Note  Matthew Hickman is a 86 y.o. male admitted on 06/20/2019 with respiratory failure, possible sepsis.  Pharmacy has been consulted for Vancomycin and Cefepime  Dosing.  Vancomycin 1 g IV given in ED at  0445  Plan: Vancomycin 1 g IV q48h Cefepime 2 g IV q12h   Height: 5\' 8"  (172.7 cm) Weight: 56 kg (123 lb 7.3 oz) IBW/kg (Calculated) : 68.4  Temp (24hrs), Avg:97.9 F (36.6 C), Min:97.9 F (36.6 C), Max:97.9 F (36.6 C)  Recent Labs  Lab 06/04/19 0244 06/04/19 0538 06/05/19 0245 06/06/19 0255 06/07/19 0302 06/09/2019 0219  WBC 6.7  --  6.4 7.2 8.3 10.5  CREATININE  --  0.83 0.84 0.77 0.85 1.20  LATICACIDVEN  --   --   --   --   --  2.1*    Estimated Creatinine Clearance: 31.8 mL/min (by C-G formula based on SCr of 1.2 mg/dL).    Allergies  Allergen Reactions  . Tape Other (See Comments)    PATIENT'S SKIN IS THIN AND IT TEARS EASILY   Caryl Pina 07/03/2019 7:21 AM

## 2019-06-10 NOTE — H&P (Addendum)
NAME:  Matthew Hickman, MRN:  [id number], DOB:  1925/04/11, LOS: 0 ADMISSION DATE:  06/24/2019, CONSULTATION DATE:  06/11/2019 REFERRING MD:  EDP, CHIEF COMPLAINT:  Dyspnea   Brief History   86 year old male with a history of paroxysmal atrial fibrillation, HFpEF, giant cell arteritis on chronic prednisone who was brought in from SNF for shortness of breath.  Possible pulmonary edema versus sepsis, required BiPAP in the ED and blood pressure borderline, so PCCM consulted for admission  History of present illness   Matthew Hickman is a 86 year old male with past medical history of paroxysmal atrial fibrillation, hypertension, HFpEF, and giant cell arteritis on chronic prednisone who presents from SNF for dyspnea.  Was recently admitted and diagnosed with atrial fibrillation requiring 10 and initiated on amiodarone, Lopressor and Eliquis and discharged on 06/07/19.  He was also treated for left forearm cellulitis and Pseudomonas UTI, discharged on Cipro and Augmentin.  Per ED report, patient was feeling short of breath all day and was on 4 L nonrebreather mask on EMS arrival.  Hypotensive with SBP in the 80s, given approximately 500 cc IV fluids.  Systolic improved 85-277 at the time of admission.  Chest x-ray showed likely pulmonary edema, minimally changed from prior film.  Lactic acid 2.1, no leukocytosis, very mildly elevated LFTs, urinalysis and respiratory viral panel negative.  EKG showed atrial fibrillation, rate 120s.  ABG 7.57/27 point 4/63/20 5.3. Patient  was awake and alert at the time of admission, he was confused, but yelling and protecting his airway well and tolerating BiPAP.  He was given vancomycin, cefepime, Flagyl, glucagon to reverse beta-blocker, and Lasix 40 mg and PCCM consulted for admission   Past Medical History   has a past medical history of Cancer ([place]) and Hypertension.  Atrial fibrillation, GCA, HFpEF  Significant Hospital Events   06/07/2019 Admit to PCCM  Consults:     Procedures:    Significant Diagnostic Tests:  06/28/2019 CXR>>Progressive bibasilar airspace disease consistent with edema.  Micro Data:  06/15/2019 RVP>>negative 07/01/2019 BCx2>> 06/24/2019 UC>>  Antimicrobials:  Cefepime 5/8- Vancomycin 5/8- Flagyl 5/8  Interim history/subjective:  Improving blood pressure and oxygenation on BiPAP 12/6 40% FiO2  Objective   Blood pressure (!) 77/56, pulse (!) 115, temperature 97.9 F (36.6 C), temperature source Oral, resp. rate (!) 27, height 5' 8" (1.727 m), weight 56 kg, SpO2 98 %.       No intake or output data in the 24 hours ending 06/19/2019 0619 Filed Weights   06/30/2019 0138  Weight: 56 kg    General:   Elderly male, awake and responding to voice HEENT: MM pink/moist, BiPAP mask in place Neuro: Oriented to person only, following commands CV: s1s2 tachycardic, regular, no m/r/g PULM: Rhonchi bilateral bases GI: soft, bsx4 active  Extremities: warm/dry, no edema  Skin: Healing cellulitis of the left forearm, no induration or drainage   Resolved Hospital Problem list     Assessment & Plan:   Acute hypoxic respiratory failure -Likely secondary to pulmonary edema/PAH though chest x-ray not severely worse than prior, last echo 55-60 and stage I diastolic failure and currently elevated pulmonary artery pressure -PO2 60s, satting 98% on BiPAP 12/6 40% and tolerating mask well, he is not hypercapnic and is very alert and protecting his airway, no indication for emergent intubation -Low suspicion for PE as he is anticoagulated P: -Receiving Lasix 40 mg, monitor urine output -Continue BiPAP, add albuterol nebs -Follow-up BNP and pro Cal  Hypotension, lactic acidosis, possible sepsis -  Recent left forearm cellulitis and Pseudomonas UTI -lactic acid 2.1 P: -Hypotension improved with 500cc  -may need low dose pressors if pressure drops after Lasix -On chronic prednisone will start stress dose steroids -Continue vank/cefepime and  follow blood and urine cultures  Paroxysmal atrial fibrillation, hypertension -Recently diagnosed, required cardioversion and started on amiodarone and beta-blocker earlier this month P: -Continue amiodarone, hold beta-blocker in the setting of hypotension may need Cardizem drip  -Troponin 60, trend -Cardiology consulted and will see   Encephalopathy -Daughter states he is usually fairly oriented, since arriving to SNF several days ago has been more confused wanting to go home -Suspect likely ongoing delirium  History of GCA -On chronic prednisone 20 mg daily P: -Stress dose steroids  Thrombocytopenia, anemia -Platelets 105, seen by hematology last admission and no indication for bone marrow biopsy -Hgb 8.7 initially, on later istat dropped to 6.1 P: -stat CBC to confirm, type and screen    *Spoke with daughter Matthew Hickman, confirms patient is currently a full code.  His other daughter Matthew Hickman daughter is his POA   Best practice:  Diet: N.p.o. Pain/Anxiety/Delirium protocol (if indicated): N/A VAP protocol (if indicated): N/A DVT prophylaxis: Eliquis GI prophylaxis: Protonix Glucose control: SSI Mobility: Bedrest Code Status: Full code Family Communication: Spoke with daughter Disposition: ICU  Labs   CBC: Recent Labs  Lab 06/04/19 0244 06/04/19 0244 06/05/19 0245 06/06/19 0255 06/07/19 0302 06/16/2019 0219 06/22/2019 0520  WBC 6.7  --  6.4 7.2 8.3 10.5  --   NEUTROABS  --   --   --   --   --  8.9*  --   HGB 9.3*   < > 9.3* 9.3* 9.0* 8.7* 6.1*  HCT 28.5*   < > 28.6* 27.9* 27.0* 27.4* 18.0*  MCV 100.7*  --  101.4* 101.5* 101.5* 105.4*  --   PLT 87*  --  96* 99* 110* 105*  --    < > = values in this interval not displayed.    Basic Metabolic Panel: Recent Labs  Lab 06/04/19 0538 06/04/19 0538 06/05/19 0245 06/06/19 0255 06/07/19 0302 06/19/2019 0219 07/02/2019 0520  NA 140   < > 141 140 139 144 142  K 4.3   < > 4.5 4.4 4.3 4.5 4.0  CL 106  --  105  106 105 107  --   CO2 26  --  _0 --   GLUCOSE 152*  --  174* 170* 198* 113*  --   BUN 37*  --  37* 38* 40* 65*  --   CREATININE 0.83  --  0.84 0.77 0.85 1.20  --   CALCIUM 8.6*  --  8.6* 8.7* 8.8* 8.3*  --   MG 2.0  --   --   --   --   --   --    < > = values in this interval not displayed.   GFR: Estimated Creatinine Clearance: 31.8 mL/min (by C-G formula based on SCr of 1.2 mg/dL). Recent Labs  Lab 06/05/19 0245 06/06/19 0255 06/07/19 0302 06/15/2019 0219  WBC 6.4 7.2 8.3 10.5  LATICACIDVEN  --   --   --  2.1*    Liver Function Tests: Recent Labs  Lab 06/06/2019 0219  AST 60*  ALT 52*  ALKPHOS 94  BILITOT 1.3*  PROT 4.3*  ALBUMIN 2.0*   No results for input(s): LIPASE, AMYLASE in the last 168 hours. No results for input(s): AMMONIA in the last  168 hours.  ABG    Component Value Date/Time   PHART 7.571 (H) 06/13/2019 0520   PCO2ART 27.4 (L) 06/09/2019 0520   PO2ART 63 (L) 06/26/2019 0520   HCO3 25.3 06/06/2019 0520   TCO2 26 06/20/2019 0520   O2SAT 95.0 06/30/2019 0520     Coagulation Profile: Recent Labs  Lab 06/16/2019 0219  INR 2.2*    Cardiac Enzymes: No results for input(s): CKTOTAL, CKMB, CKMBINDEX, TROPONINI in the last 168 hours.  HbA1C: Hgb A1c MFr Bld  Date/Time Value Ref Range Status  04/07/2019 04:52 AM 5.0 4.8 - 5.6 % Final    Comment:    (NOTE) Pre diabetes:          5.7%-6.4% Diabetes:              >6.4% Glycemic control for   <7.0% adults with diabetes     CBG: No results for input(s): GLUCAP in the last 168 hours.  Review of Systems:   Unable to obtain secondary to mental status  Past Medical History  He,  has a past medical history of Cancer ([place]) and Hypertension.   Surgical History    Past Surgical History:  Procedure Laterality Date  . APPENDECTOMY    . ARTERY BIOPSY Bilateral 04/07/2019   Procedure: BIOPSY TEMPORAL ARTERY;  Surgeon: Jesusita Oka, MD;  Location: [place];  Service: General;  Laterality:  Bilateral;  . CARDIOVERSION N/A 06/02/2019   Procedure: CARDIOVERSION;  Surgeon: Donato Heinz, MD;  Location: [place];  Service: Cardiovascular;  Laterality: N/A;  . MELANOMA EXCISION Bilateral 01/30/2019   Procedure: WIDE LOCAL EXCISION WITH ADVANCEMENT FLAP CLOSURE OF MELANOMA LEFT NECK AND RIGHT BACK;  Surgeon: [name], MD;  Location: [place];  Service: General;  Laterality: Bilateral;  . TEE WITHOUT CARDIOVERSION N/A 06/02/2019   Procedure: TRANSESOPHAGEAL ECHOCARDIOGRAM (TEE);  Surgeon: Donato Heinz, MD;  Location[facility] ENDOSCOPY;  Service: Cardiovascular;  Laterality: N/A;     Social History   reports that he has never smoked. He has never used smokeless tobacco. He reports current alcohol use. He reports that he does not use drugs.   Family History   His family history includes Heart attack in his father; Heart failure in his father.   Allergies Allergies  Allergen Reactions  . Tape Other (See Comments)    PATIENT'S SKIN IS THIN AND IT TEARS EASILY     Home Medications  Prior to Admission medications   Medication Sig Start Date End Date Taking? Authorizing Provider  Amino Acids-Protein Hydrolys (FEEDING SUPPLEMENT, PRO-STAT SUGAR FREE 64,) LIQD Take 30 mLs by mouth 2 (two) times daily with a meal.   Yes [provider]  amiodarone (PACERONE) 400 MG tablet Take 1 tablet (400 mg total) by mouth 2 (two) times daily. 06/07/19  Yes Hosie Poisson, MD  amoxicillin-clavulanate (AUGMENTIN) 875-125 MG tablet Take 1 tablet by mouth every 12 (twelve) hours for 5 days. 06/07/19 06/12/19 Yes Hosie Poisson, MD  apixaban (ELIQUIS) 2.5 MG TABS tablet Take 1 tablet (2.5 mg total) by mouth 2 (two) times daily. 06/07/19  Yes Hosie Poisson, MD  folic acid (FOLVITE) 1 MG tablet Take 1 tablet (1 mg total) by mouth daily. 06/07/19  Yes Hosie Poisson, MD  metoprolol tartrate (LOPRESSOR) 100 MG tablet Take 1 tablet (100 mg total) by mouth 2 (two) times daily. 06/07/19  Yes Hosie Poisson, MD  Multiple Vitamin (MULTIVITAMIN WITH MINERALS) TABS tablet Take 1 tablet by mouth daily.   Yes [provider]  pantoprazole (PROTONIX) 20 MG tablet Take 1 tablet (20 mg total) by mouth daily. 04/11/19  Yes Guilford Shi, MD  predniSONE (DELTASONE) 20 MG tablet Take 20 mg by mouth daily.  05/19/19  Yes [provider]  saccharomyces boulardii (FLORASTOR) 250 MG capsule Take 1 capsule (250 mg total) by mouth 2 (two) times daily. 06/07/19  Yes Hosie Poisson, MD  terazosin (HYTRIN) 1 MG capsule Take 1 mg by mouth at bedtime.   Yes [provider]  thiamine 100 MG tablet Take 1 tablet (100 mg total) by mouth daily. 06/07/19  Yes Hosie Poisson, MD  feeding supplement, ENSURE ENLIVE, (ENSURE ENLIVE) LIQD Take 237 mLs by mouth 3 (three) times daily between meals. Patient not taking: Reported on 06/11/2019 04/10/19   Guilford Shi, MD     Critical care time: 55 minutes      CRITICAL CARE Performed by: Otilio Carpen Romney Compean   Total critical care time: 55 minutes  Critical care time was exclusive of separately billable procedures and treating other patients.  Critical care was necessary to treat or prevent imminent or life-threatening deterioration.  Critical care was time spent personally by me on the following activities: development of treatment plan with patient and/or surrogate as well as nursing, discussions with consultants, evaluation of patient's response to treatment, examination of patient, obtaining history from patient or surrogate, ordering and performing treatments and interventions, ordering and review of laboratory studies, ordering and review of radiographic studies, pulse oximetry and re-evaluation of patient's condition.   Otilio Carpen Donnelle Olmeda, PA-C

## 2019-06-10 NOTE — Progress Notes (Signed)
Notified bedside nurse of need to draw repeat lactic acid. 

## 2019-06-10 NOTE — Progress Notes (Signed)
Met with daughter, POA who is in close contact with patient wife and family. We discussed if Matthew Hickman would want to be put on life support if he deteriorates and she says he would not. She agrees with current measures but if he takes a turn for worse we will call her and make him comfortable.  Erskine Emery MD PCCM

## 2019-06-10 NOTE — ED Notes (Signed)
Blood cultures drawn before antibiotics started at this time.  Time of blood cultures were 0220 and 0222.

## 2019-06-10 NOTE — Progress Notes (Signed)
SLP Cancellation Note  Patient Details Name: Matthew Hickman MRN: [id number] DOB: February 20, 1925   Cancelled treatment:       Reason Eval/Treat Not Completed: Medical issues which prohibited therapy.  Pt has been on BiPAP all day thus far.  Spoke with RN who stated that pt is also lethargic at this time.  Will plan for BSE tomorrow if pt is appropriate.     Elvia Collum [place] Osmer 06/20/2019, 2:26 PM

## 2019-06-10 NOTE — Consult Note (Addendum)
Cardiology Consultation:   Patient ID: Matthew Hickman MRN: [id number]; DOB: [birth year]  Admit date: 06/08/2019 Date of Consult: 06/18/2019  Primary Care Provider: [name], MD Primary Cardiologist: Sherren Mocha, MD  Primary Electrophysiologist:  None    Patient Profile:   Matthew Hickman is a 86 y.o. male with a hx of HTN, skin cancer and giant cell arteritis, a fib with recent discharge 06/07/19  who is being seen today for the evaluation of Acute CHF at the request of Dr. Audelia Acton.Marland Kitchen  History of Present Illness:   Matthew Hickman has a hx of atrial ectopy with no need for eliquis with hospitalization in early March.  He returned 05/29/19 with atypical a flutter RVR and admitted.  He did have thrombocytopenia at that time. Placed on amiodarone along with BB.  Also with moderate pulmonary HTN.   Matthew Hickman was anticoagulated and underwent TEE and DCV 06/02/19 which was successful to ST with freq PACs.      He saw heme for thrombocytopenia multifactorial with autoimmune disorder, bone marrow suppression from multiple antibiotics and UTI.  No further work up currently.  Ok for anticoagulation. On 5/2 had freq non sustained atrial tachycardia no a fib. On 06/05/19 was in a flutter with variable ventricular response.  Also developed arm abscess treated with ABX.  Also UTI treated.  D/c'd on aiodarone 400 mg BID and lopressor 1000 mg BID HR would increase to 130s prior to discharge.     Matthew Hickman discharged 06/07/19 and readmitted today 06/15/2019 after presenting by EMS with acute SOB.   BP was in the 88K systolic. SOB began on 06/09/19 and increased through day.  Matthew Hickman placed on BiPAP.  Amiodarone held with concern for amiodarone toxicity.  Has acute respiratory failure and on BiPAP.   EKG:  The EKG was personally reviewed and demonstrates:  Very difficult to read with baseline artifact.  HR 117 and may be a flutter with RVR vs SR with PACs though in lead 2 appears flutter waves. Telemetry:  Telemetry was personally reviewed and  demonstrates:  A flutter RVR at 124.  occ PVC  Na 144, K+ 4.5, BUN 65 up from 40  Cr 1.20 up from 0.85  Hs troponin 60  Lactic acid 2.1 Hgb 8.7 and Hct 27 WBC 10.5 and plts 105.  Recheck Hgb 6.1 though not sure if accurate  Neg COVID   PCXR Progressive bibasilar airspace disease consistent with edema.  Currently no acute distress on BiPAP denies chest pain.  BP improved to 111/91.    Past Medical History:  Diagnosis Date  . Cancer ([place])    skin  . Hypertension     Past Surgical History:  Procedure Laterality Date  . APPENDECTOMY    . ARTERY BIOPSY Bilateral 04/07/2019   Procedure: BIOPSY TEMPORAL ARTERY;  Surgeon: Jesusita Oka, MD;  Location: [place];  Service: General;  Laterality: Bilateral;  . CARDIOVERSION N/A 06/02/2019   Procedure: CARDIOVERSION;  Surgeon: Donato Heinz, MD;  Location: [place];  Service: Cardiovascular;  Laterality: N/A;  . MELANOMA EXCISION Bilateral 01/30/2019   Procedure: WIDE LOCAL EXCISION WITH ADVANCEMENT FLAP CLOSURE OF MELANOMA LEFT NECK AND RIGHT BACK;  Surgeon: [name], MD;  Location: [place];  Service: General;  Laterality: Bilateral;  . TEE WITHOUT CARDIOVERSION N/A 06/02/2019   Procedure: TRANSESOPHAGEAL ECHOCARDIOGRAM (TEE);  Surgeon: Donato Heinz, MD;  Location: [facility] ENDOSCOPY;  Service: Cardiovascular;  Laterality: N/A;     Home Medications:  Prior to Admission medications  Medication Sig Start Date End Date Taking? Authorizing Provider  Amino Acids-Protein Hydrolys (FEEDING SUPPLEMENT, PRO-STAT SUGAR FREE 64,) LIQD Take 30 mLs by mouth 2 (two) times daily with a meal.   Yes [provider]  amiodarone (PACERONE) 400 MG tablet Take 1 tablet (400 mg total) by mouth 2 (two) times daily. 06/07/19  Yes Hosie Poisson, MD  amoxicillin-clavulanate (AUGMENTIN) 875-125 MG tablet Take 1 tablet by mouth every 12 (twelve) hours for 5 days. 06/07/19 06/12/19 Yes Hosie Poisson, MD  apixaban (ELIQUIS) 2.5 MG TABS tablet  Take 1 tablet (2.5 mg total) by mouth 2 (two) times daily. 06/07/19  Yes Hosie Poisson, MD  folic acid (FOLVITE) 1 MG tablet Take 1 tablet (1 mg total) by mouth daily. 06/07/19  Yes Hosie Poisson, MD  metoprolol tartrate (LOPRESSOR) 100 MG tablet Take 1 tablet (100 mg total) by mouth 2 (two) times daily. 06/07/19  Yes Hosie Poisson, MD  Multiple Vitamin (MULTIVITAMIN WITH MINERALS) TABS tablet Take 1 tablet by mouth daily.   Yes [provider]  pantoprazole (PROTONIX) 20 MG tablet Take 1 tablet (20 mg total) by mouth daily. 04/11/19  Yes Guilford Shi, MD  predniSONE (DELTASONE) 20 MG tablet Take 20 mg by mouth daily.  05/19/19  Yes [provider]  saccharomyces boulardii (FLORASTOR) 250 MG capsule Take 1 capsule (250 mg total) by mouth 2 (two) times daily. 06/07/19  Yes Hosie Poisson, MD  terazosin (HYTRIN) 1 MG capsule Take 1 mg by mouth at bedtime.   Yes [provider]  thiamine 100 MG tablet Take 1 tablet (100 mg total) by mouth daily. 06/07/19  Yes Hosie Poisson, MD  feeding supplement, ENSURE ENLIVE, (ENSURE ENLIVE) LIQD Take 237 mLs by mouth 3 (three) times daily between meals. Patient not taking: Reported on 06/08/2019 04/10/19   Guilford Shi, MD    Inpatient Medications: Scheduled Meds: . amiodarone  400 mg Oral BID  . apixaban  2.5 mg Oral BID  . furosemide  40 mg Intravenous Once  . hydrocortisone sod succinate (SOLU-CORTEF) inj  50 mg Intravenous Q6H  . pantoprazole  20 mg Oral Daily   Continuous Infusions: . ceFEPime (MAXIPIME) IV    . [START ON 06/12/2019] vancomycin     PRN Meds: albuterol, docusate sodium, polyethylene glycol  Allergies:    Allergies  Allergen Reactions  . Tape Other (See Comments)    PATIENT'S SKIN IS THIN AND IT TEARS EASILY    Social History:   Social History   Socioeconomic History  . Marital status: Married    Spouse name: Not on file  . Number of children: Not on file  . Years of education: Not on file  . Highest  education level: Not on file  Occupational History  . Not on file  Tobacco Use  . Smoking status: Never Smoker  . Smokeless tobacco: Never Used  Substance and Sexual Activity  . Alcohol use: Yes    Comment: 1 glass of bourbon per night  . Drug use: Never  . Sexual activity: Not on file  Other Topics Concern  . Not on file  Social History Narrative  . Not on file   Social Determinants of Health   Financial Resource Strain:   . Difficulty of Paying Living Expenses:   Food Insecurity:   . Worried About Charity fundraiser in the Last Year:   . Arboriculturist in the Last Year:   Transportation Needs:   . Film/video editor (Medical):   Marland Kitchen  Lack of Transportation (Non-Medical):   Physical Activity:   . Days of Exercise per Week:   . Minutes of Exercise per Session:   Stress:   . Feeling of Stress :   Social Connections:   . Frequency of Communication with Friends and Family:   . Frequency of Social Gatherings with Friends and Family:   . Attends Religious Services:   . Active Member of Clubs or Organizations:   . Attends Archivist Meetings:   Marland Kitchen Marital Status:   Intimate Partner Violence:   . Fear of Current or Ex-Partner:   . Emotionally Abused:   Marland Kitchen Physically Abused:   . Sexually Abused:     Family History:    Family History  Problem Relation Age of Onset  . Heart attack Father        Died at age 92  . Heart failure Father      ROS:  Please see the history of present illness.  General:no colds or fevers, no weight changes Skin:no rashes or ulcers HEENT:no blurred vision, no congestion CV:see HPI PUL:see HPI GI:no diarrhea constipation or melena, no indigestion GU:no hematuria, no dysuria MS:no joint pain, no claudication Neuro:no syncope, no lightheadedness Endo:no diabetes, no thyroid disease  All other ROS reviewed and negative.     Physical Exam/Data:   Vitals:   06/23/2019 0508 06/18/2019 0600 06/19/2019 0645 06/13/2019 0718  BP:  108/61  93/64   Pulse: (!) 115 (!) 120 (!) 128 (!) 124  Resp: (!) 27 (!) 23 (!) 23 (!) 24  Temp:      TempSrc:      SpO2: 98% 98% (!) 83% 96%  Weight:      Height:       No intake or output data in the 24 hours ending 06/24/2019 0727 Last 3 Weights 07/03/2019 06/07/2019 06/06/2019  Weight (lbs) 123 lb 7.3 oz 123 lb 7.3 oz 126 lb 5.2 oz  Weight (kg) 56 kg 56 kg 57.3 kg     Body mass index is 18.77 kg/m.  General:  Frail male, in no acute distress HEENT: normal Lymph: no adenopathy Neck: mild JVD Endocrine:  No thryomegaly Vascular: No carotid bruits; pedal pulses 1+ bilaterally  Cardiac:  irreg irreg ; no murmur gallup rub or click Lungs:  Rhonchi ant.  to auscultation bilaterally, no wheezing,no rales  Abd: soft, nontender, no hepatomegaly  Ext: 1+ lower ext edema Musculoskeletal:  No deformities, BUE and BLE strength normal and equal Skin: warm and dry  Neuro:  Alert and oriented X 3 , no focal abnormalities noted Psych:  Normal affect    Relevant CV Studies: Echo 04/05/19  1. Left ventricular ejection fraction, by estimation, is 55 to 60%. The  left ventricle has normal function. The left ventricle has no regional  wall motion abnormalities. Left ventricular diastolic parameters are  consistent with Grade I diastolic  dysfunction (impaired relaxation).  2. Right ventricular systolic function is normal. The right ventricular  size is normal. There is moderately elevated pulmonary artery systolic  pressure. The estimated right ventricular systolic pressure is 76.8 mmHg.  3. The mitral valve is normal in structure and function. Trivial mitral  valve regurgitation. No evidence of mitral stenosis.  4. The aortic valve is tricuspid. Aortic valve regurgitation is not  visualized. No aortic stenosis is present.  5. Aortic dilatation noted. There is mild dilatation of the ascending  aorta measuring 40 mm.  6. The inferior vena cava is dilated in size with <  50% respiratory  variability,  suggesting right atrial pressure of 15 mmHg.    TEE 06/02/19 IMPRESSIONS    1. Left ventricular ejection fraction, by estimation, is 55 to 60%. The  left ventricle has normal function. The left ventricle has no regional  wall motion abnormalities.  2. Right ventricular systolic function is normal. The right ventricular  size is normal.  3. The mitral valve is normal in structure. Mild mitral valve  regurgitation.  4. The aortic valve is tricuspid. Aortic valve regurgitation is mild. No  aortic stenosis is present.  5. Aortic dilatation noted. There is mild dilatation of the aortic root  measuring 40 mm.  6. No left atrial/left atrial appendage thrombus was detected.   FINDINGS  Left Ventricle: Left ventricular ejection fraction, by estimation, is 55  to 60%. The left ventricle has normal function. The left ventricle has no  regional wall motion abnormalities. The left ventricular internal cavity  size was normal in size. There is  no left ventricular hypertrophy.   Right Ventricle: The right ventricular size is normal. Right vetricular  wall thickness was not assessed. Right ventricular systolic function is  normal.   Left Atrium: Left atrial size was normal in size. No left atrial/left  atrial appendage thrombus was detected.   Right Atrium: Right atrial size was normal in size.   Pericardium: There is no evidence of pericardial effusion.   Mitral Valve: The mitral valve is normal in structure. Mild mitral valve  regurgitation.   Tricuspid Valve: The tricuspid valve is normal in structure. Tricuspid  valve regurgitation is mild.   Aortic Valve: The aortic valve is tricuspid. Aortic valve regurgitation is  mild. No aortic stenosis is present.   Pulmonic Valve: The pulmonic valve was grossly normal. Pulmonic valve  regurgitation is mild.   Aorta: Aortic dilatation noted. There is mild dilatation of the aortic  root measuring 40 mm.   IAS/Shunts: No atrial  level shunt detected by color flow Doppler.   Laboratory Data:  High Sensitivity Troponin:   Recent Labs  Lab 06/20/2019 0219  TROPONINIHS 60*     Chemistry Recent Labs  Lab 06/06/19 0255 06/06/19 0255 06/07/19 0302 06/07/2019 0219 07/03/2019 0520  NA 140   < > 139 144 142  K 4.4   < > 4.3 4.5 4.0  CL 106  --  105 107  --   CO2 25  --  24 24  --   GLUCOSE 170*  --  198* 113*  --   BUN 38*  --  40* 65*  --   CREATININE 0.77  --  0.85 1.20  --   CALCIUM 8.7*  --  8.8* 8.3*  --   GFRNONAA >60  --  >60 53*  --   GFRAA >60  --  >60 >60  --   ANIONGAP 9  --  10 13  --    < > = values in this interval not displayed.    Recent Labs  Lab 06/26/2019 0219  PROT 4.3*  ALBUMIN 2.0*  AST 60*  ALT 52*  ALKPHOS 94  BILITOT 1.3*   Hematology Recent Labs  Lab 06/06/19 0255 06/06/19 0255 06/07/19 0302 06/29/2019 0219 06/25/2019 0520  WBC 7.2  --  8.3 10.5  --   RBC 2.75*  --  2.66* 2.60*  --   HGB 9.3*   < > 9.0* 8.7* 6.1*  HCT 27.9*   < > 27.0* 27.4* 18.0*  MCV 101.5*  --  101.5* 105.4*  --   MCH 33.8  --  33.8 33.5  --   MCHC 33.3  --  33.3 31.8  --   RDW 20.3*  --  20.3* 21.5*  --   PLT 99*  --  110* 105*  --    < > = values in this interval not displayed.   BNPNo results for input(s): BNP, PROBNP in the last 168 hours.  DDimer No results for input(s): DDIMER in the last 168 hours.   Radiology/Studies:  DG Chest Port 1 View  Result Date: 06/28/2019 CLINICAL DATA:  Shortness of breath, tachycardia, tachypnea EXAM: PORTABLE CHEST 1 VIEW COMPARISON:  06/06/2019 FINDINGS: Single frontal view of the chest demonstrates a stable cardiac silhouette. Progressive bibasilar interstitial and ground-glass opacities. Trace bilateral effusions. No pneumothorax. IMPRESSION: 1. Progressive bibasilar airspace disease consistent with edema. Electronically Signed   By: Randa Ngo M.D.   On: 06/24/2019 02:12   DG CHEST PORT 1 VIEW  Result Date: 06/06/2019 CLINICAL DATA:  Chest pain.   Shortness of breath. EXAM: PORTABLE CHEST 1 VIEW COMPARISON:  05/29/2019.  04/04/2019. FINDINGS: Cardiomegaly. Diffuse bilateral pulmonary interstitial prominence and bilateral pleural effusions. Findings suggest CHF. Pneumonitis cannot be excluded. Degenerative change thoracic spine. IMPRESSION: Cardiomegaly with diffuse bilateral interstitial prominence and bilateral pleural effusions suggesting CHF. Pneumonitis cannot be excluded. Electronically Signed   By: Marcello Moores  Register   On: 06/06/2019 13:56        NO CHEST PAIN  Assessment and Plan:   1. Acute on chronic diastolic HF with a fib/flutter - BiPAP admitted by CCM. Matthew Hickman recently discharged with similar issues.  Given IV glucagon to reverse BB  Has not rec'd lasix.  Troponin 60 most likely demand ischemia (no hx of cath or nuc study) 2. Acute respiratory failure. Per CCM on Bipap 3. A flutter with RVR will check repeat EKG and tele. Atrial flutter with RVR. Has been on amiodarone 400 bid and lopressor 100 mg BID.  Concern for amiodarone toxicity. Is held currently. Just had TEE DCCV  06/02/19 successful but then with runs of atrial tachy and increase of amiodarone then prior to discharge back in a flutter with tachycardia at times.  Now amiodarone on hold - BB held, and glucagon given.  With normal EF ? Add dilt for rate control.  Will defer to Dr. Lovena Le.   4. anticoagulation on eliquis  Continue  INR was 2 -again on eliquis  CHA2DS2VASc of 3 with new anemia, ? Hold defer to Dr. Lovena Le.   5. Arm abcess on ABX as outpt.  Also UTI per CCM on abx IV here 6. Treated for giant cell arteritis. On steroids 7. Thrombocytopenia improved from recent hospitalization did see Heme and no need for bone marrow biopsy.  Today plts have improved.   8. Increase of LFTs with AST of 60 and ALT of 52  9. Anemia with Hgb 8.7 and recheck 7.4 WBC up to 13.9 but is on steroids.  10. Hx mild to mod pulmonary HTN.    Matthew Hickman was at [facility].  For questions or  updates, please contact [place] Please consult [email or web address] for contact info under   Signed, Cecilie Kicks, NP  06/17/2019 7:27 AM   Cardiology Attending  Patient seen and examined. Agree with the findings as noted above. The patient has left atrial flutter and atrial fib. His rates are uncontrolled and being driven by his multiple medical problems including infection, chf, and ongoing steroid use. He has  been on fairly high dose amiodarone. He has developed some LFT worsening which could be amio and/or CHF with passive congestion. I would give IV digoxin and if BP will allow, IV cardizem. No indication for DCCV while he is critically ill. He will just revert back to atrial fib. We will follow with you.  Mikle Bosworth.D.

## 2019-06-10 NOTE — Evaluation (Signed)
Clinical/Bedside Swallow Evaluation Patient Details  Name: Matthew Hickman MRN: [id number] Date of Birth: 29-Mar-1925  Today's Date: 06/11/2019 Time: SLP Start Time (ACUTE ONLY): [id number] SLP Stop Time (ACUTE ONLY): 1553 SLP Time Calculation (min) (ACUTE ONLY): 15 min  Past Medical History:  Past Medical History:  Diagnosis Date  . Cancer ([place])    skin  . Hypertension    Past Surgical History:  Past Surgical History:  Procedure Laterality Date  . APPENDECTOMY    . ARTERY BIOPSY Bilateral 04/07/2019   Procedure: BIOPSY TEMPORAL ARTERY;  Surgeon: Matthew Oka, MD;  Location: [place];  Service: General;  Laterality: Bilateral;  . CARDIOVERSION N/A 06/02/2019   Procedure: CARDIOVERSION;  Surgeon: Matthew Heinz, MD;  Location: [place];  Service: Cardiovascular;  Laterality: N/A;  . MELANOMA EXCISION Bilateral 01/30/2019   Procedure: WIDE LOCAL EXCISION WITH ADVANCEMENT FLAP CLOSURE OF MELANOMA LEFT NECK AND RIGHT BACK;  Surgeon: Matthew Klein, MD;  Location: [place];  Service: General;  Laterality: Bilateral;  . TEE WITHOUT CARDIOVERSION N/A 06/02/2019   Procedure: TRANSESOPHAGEAL ECHOCARDIOGRAM (TEE);  Surgeon: Matthew Heinz, MD;  Location: [facility] ENDOSCOPY;  Service: Cardiovascular;  Laterality: N/A;   HPI:  Matthew Hickman is a 86 y.o. male with a hx of HTN, skin cancer and giant cell arteritis, a fib with recent discharge 06/07/19  who is being seen today for the evaluation of Acute CHF.  CXR reported: "Progressive bibasilar airspace disease consistent with edema."    Assessment / Plan / Recommendation Clinical Impression  Pt was seen for a bedside swallow evaluation he presents with suspected oropharyngeal dysphagia.  Pt was encountered asleep in bed on HFNC (previously on BiPAP).  Pt roused to max verbal and tactile stimulation and he was agreeable to PO trials.  Pt was unable to follow commands in order to complete an oral mechanism exam secondary to lethargy and hearing  deficits.  Pt was noted to have missing dentition and a dry oral cavity to limited evaluation.  SLP completed oral care with suction toothbrush/swab.  Pt consumed trials of ice chips, thin liquid, and puree.  He demonstrated reduced labial closure around the spoon and straw with all trials and decreased lingual manipulation of trials was observed.  Pt did not attempt to masticate small ice chip trial and he subsequently swallowed it whole.  Multiple swallows per bolus were also intermittently observed across trials.  Pt consistently exhibited s/sx of aspiration including immediate or delayed coughing/throat clearing with all trials administered.  Recommend continuation of NPO at this time with frequent oral care and medications administered via alternative means.  To help keep oral mucosa moistened, pt may have a few small spoon sips of water upon request given thorough oral care and full RN supervision.  RN was made aware of all recommendations.  Palliative care is currently following pt and will be beneficial in establishing [place].  SLP will f/u per POC.    SLP Visit Diagnosis: Dysphagia, unspecified (R13.10)    Aspiration Risk  Moderate aspiration risk    Diet Recommendation NPO   Liquid Administration via: Spoon Medication Administration: Via alternative means Supervision: Full supervision/cueing for compensatory strategies Compensations: Small sips/bites Postural Changes: Seated upright at 90 degrees    Other  Recommendations Oral Care Recommendations: Oral care QID;Oral care prior to ice chip/H20;Staff/trained caregiver to provide oral care Other Recommendations: Have oral suction available   Follow up Recommendations Skilled Nursing facility;24 hour supervision/assistance      Frequency and Duration  min 2x/week  2 weeks       Prognosis Prognosis for Safe Diet Advancement: Guarded      Swallow Study   General HPI: Matthew Hickman is a 86 y.o. male with a hx of HTN, skin cancer and  giant cell arteritis, a fib with recent discharge 06/07/19  who is being seen today for the evaluation of Acute CHF.  CXR reported: "Progressive bibasilar airspace disease consistent with edema."  Type of Study: Bedside Swallow Evaluation Previous Swallow Assessment: None per chart review Diet Prior to this Study: NPO Temperature Spikes Noted: No Respiratory Status: Nasal cannula History of Recent Intubation: No Behavior/Cognition: Cooperative;Pleasant mood;Lethargic/Drowsy Oral Cavity Assessment: Dry Oral Care Completed by SLP: Yes Oral Cavity - Dentition: Missing dentition;Poor condition Patient Positioning: Upright in bed Baseline Vocal Quality: Normal Volitional Cough: Strong Volitional Swallow: Unable to elicit    Oral/Motor/Sensory Function Overall Oral Motor/Sensory Function: Other (comment)(Unable to evaluate - pt with difficulty following commands)   Ice Chips Ice chips: Impaired Presentation: Spoon Oral Phase Impairments: Reduced lingual movement/coordination;Reduced labial seal;Impaired mastication Pharyngeal Phase Impairments: Suspected delayed Swallow;Throat Clearing - Immediate   Thin Liquid Thin Liquid: Impaired Presentation: Spoon;Straw Oral Phase Impairments: Reduced labial seal Pharyngeal  Phase Impairments: Suspected delayed Swallow;Multiple swallows;Cough - Immediate;Cough - Delayed;Throat Clearing - Delayed;Throat Clearing - Immediate    Nectar Thick Nectar Thick Liquid: Not tested   Honey Thick Honey Thick Liquid: Not tested   Puree Puree: Impaired Presentation: Spoon Oral Phase Impairments: Reduced labial seal;Reduced lingual movement/coordination Oral Phase Functional Implications: Prolonged oral transit Pharyngeal Phase Impairments: Suspected delayed Swallow;Cough - Immediate   Solid     Solid: Not tested     Matthew Hickman M.S., CCC-SLP Acute Rehabilitation Services Office: [phone]  Matthew Hickman Matthew Hickman 06/08/2019,4:09 PM

## 2019-06-10 NOTE — ED Triage Notes (Signed)
Patient here via [facility], patient is from [facility].  Patient having shortness of breath all day.  Patient was found on NRB 4L/min at the SNF.  Patient is tachy and tachypneic.  Patient has cellulitis on bilateral arms and is on antibiotic therapy for it.  Patient is hypotensive in the [id number] systolic, was given [id number] NS en route to ED, but no change in BP.  Patient is at his baseline per staff at [facility].

## 2019-06-10 NOTE — Progress Notes (Signed)
Messaged bedside nurse for clarification of Blood Culture collection times. It appeared that blood cultures were drawn after antibiotic administration. Bedside RN notified Elink RN that this is incorrect, that in fact blood cultures were collected before administration.

## 2019-06-10 NOTE — ED Provider Notes (Addendum)
[facility] EMERGENCY DEPARTMENT Provider Note   CSN: [id number] Arrival date & time: 07/03/2019  0120     History Chief Complaint  Patient presents with  . Shortness of Breath    Matthew Hickman is a 86 y.o. male.  The history is provided by the EMS personnel. The history is limited by the condition of the patient.  Shortness of Breath Severity:  Severe Onset quality:  Gradual Duration:  1 day Timing:  Constant Chronicity:  New Context: not activity   Relieved by:  Nothing Worsened by:  Nothing Ineffective treatments:  None tried Associated symptoms: no abdominal pain, no claudication, no fever and no PND   Risk factors: no recent alcohol use        Past Medical History:  Diagnosis Date  . Cancer ([place])    skin  . Hypertension     Patient Active Problem List   Diagnosis Date Noted  . Atrial flutter with rapid ventricular response ([place])   . Fall   . Visual changes 04/10/2019  . Tachycardia 04/10/2019  . Normocytic anemia 04/10/2019  . Melanoma ([place]) 04/10/2019  . Penile bleeding 04/10/2019  . Protein-calorie malnutrition, severe 04/06/2019  . A-fib ([place]) 04/04/2019  . Giant cell arteritis ([place]) 04/04/2019  . Benign essential HTN 04/04/2019    Past Surgical History:  Procedure Laterality Date  . APPENDECTOMY    . ARTERY BIOPSY Bilateral 04/07/2019   Procedure: BIOPSY TEMPORAL ARTERY;  Surgeon: Jesusita Oka, MD;  Location: [facility];  Service: General;  Laterality: Bilateral;  . CARDIOVERSION N/A 06/02/2019   Procedure: CARDIOVERSION;  Surgeon: Donato Heinz, MD;  Location: [facility];  Service: Cardiovascular;  Laterality: N/A;  . MELANOMA EXCISION Bilateral 01/30/2019   Procedure: WIDE LOCAL EXCISION WITH ADVANCEMENT FLAP CLOSURE OF MELANOMA LEFT NECK AND RIGHT BACK;  Surgeon: [name], MD;  Location: [facility];  Service: General;  Laterality: Bilateral;  . TEE WITHOUT CARDIOVERSION N/A 06/02/2019   Procedure: TRANSESOPHAGEAL  ECHOCARDIOGRAM (TEE);  Surgeon: Donato Heinz, MD;  Location: [facility] ENDOSCOPY;  Service: Cardiovascular;  Laterality: N/A;       Family History  Problem Relation Age of Onset  . Heart attack Father        Died at age 52  . Heart failure Father     Social History   Tobacco Use  . Smoking status: Never Smoker  . Smokeless tobacco: Never Used  Substance Use Topics  . Alcohol use: Yes    Comment: 1 glass of bourbon per night  . Drug use: Never    Home Medications Prior to Admission medications   Medication Sig Start Date End Date Taking? Authorizing Provider  Amino Acids-Protein Hydrolys (FEEDING SUPPLEMENT, PRO-STAT SUGAR FREE 64,) LIQD Take 30 mLs by mouth 2 (two) times daily with a meal.   Yes [provider]  amiodarone (PACERONE) 400 MG tablet Take 1 tablet (400 mg total) by mouth 2 (two) times daily. 06/07/19  Yes Hosie Poisson, MD  amoxicillin-clavulanate (AUGMENTIN) 875-125 MG tablet Take 1 tablet by mouth every 12 (twelve) hours for 5 days. 06/07/19 06/12/19 Yes Hosie Poisson, MD  apixaban (ELIQUIS) 2.5 MG TABS tablet Take 1 tablet (2.5 mg total) by mouth 2 (two) times daily. 06/07/19  Yes Hosie Poisson, MD  folic acid (FOLVITE) 1 MG tablet Take 1 tablet (1 mg total) by mouth daily. 06/07/19  Yes Hosie Poisson, MD  metoprolol tartrate (LOPRESSOR) 100 MG tablet Take 1 tablet (100 mg total) by mouth 2 (  two) times daily. 06/07/19  Yes Hosie Poisson, MD  Multiple Vitamin (MULTIVITAMIN WITH MINERALS) TABS tablet Take 1 tablet by mouth daily.   Yes [provider]  pantoprazole (PROTONIX) 20 MG tablet Take 1 tablet (20 mg total) by mouth daily. 04/11/19  Yes Guilford Shi, MD  predniSONE (DELTASONE) 20 MG tablet Take 20 mg by mouth daily.  05/19/19  Yes [provider]  saccharomyces boulardii (FLORASTOR) 250 MG capsule Take 1 capsule (250 mg total) by mouth 2 (two) times daily. 06/07/19  Yes Hosie Poisson, MD  terazosin (HYTRIN) 1 MG capsule Take 1 mg by  mouth at bedtime.   Yes [provider]  thiamine 100 MG tablet Take 1 tablet (100 mg total) by mouth daily. 06/07/19  Yes Hosie Poisson, MD  feeding supplement, ENSURE ENLIVE, (ENSURE ENLIVE) LIQD Take 237 mLs by mouth 3 (three) times daily between meals. Patient not taking: Reported on 06/11/2019 04/10/19   Guilford Shi, MD    Allergies    Tape  Review of Systems   Review of Systems  Unable to perform ROS: Acuity of condition  Constitutional: Negative for fever.  Respiratory: Positive for shortness of breath.   Cardiovascular: Negative for claudication and PND.  Gastrointestinal: Negative for abdominal pain.    Physical Exam Updated Vital Signs BP (!) 77/56   Pulse (!) 115   Temp 97.9 F (36.6 C) (Oral)   Resp (!) 27   Ht 5\' 8"  (1.727 m)   Wt 56 kg   SpO2 98%   BMI 18.77 kg/m   Physical Exam Vitals and nursing note reviewed.  Constitutional:      Appearance: He is not diaphoretic.  HENT:     Head: Normocephalic and atraumatic.     Right Ear: Ear canal normal.     Nose: Nose normal.  Eyes:     Conjunctiva/sclera: Conjunctivae normal.     Pupils: Pupils are equal, round, and reactive to light.  Cardiovascular:     Rate and Rhythm: Tachycardia present. Rhythm irregular.     Pulses: Normal pulses.     Heart sounds: Normal heart sounds.  Pulmonary:     Breath sounds: Decreased breath sounds and rales present.  Abdominal:     General: Abdomen is flat. Bowel sounds are normal.     Tenderness: There is no abdominal tenderness. There is no guarding.  Musculoskeletal:     Cervical back: Normal range of motion and neck supple.     Right lower leg: No edema.     Left lower leg: No edema.  Skin:    General: Skin is warm and dry.     Capillary Refill: Capillary refill takes less than 2 seconds.  Neurological:     General: No focal deficit present.     Mental Status: He is alert.     Deep Tendon Reflexes: Reflexes normal.  Psychiatric:     Comments: Unable        ED Results / Procedures / Treatments   Labs (all labs ordered are listed, but only abnormal results are displayed) Results for orders placed or performed during the hospital encounter of 06/21/2019  Respiratory Panel by RT PCR (Flu A&B, Covid) - Nasopharyngeal Swab   Specimen: Nasopharyngeal Swab  Result Value Ref Range   SARS Coronavirus 2 by RT PCR NEGATIVE NEGATIVE   Influenza A by PCR NEGATIVE NEGATIVE   Influenza B by PCR NEGATIVE NEGATIVE  Lactic acid, plasma  Result Value Ref Range   Lactic Acid,  Venous 2.1 (HH) 0.5 - 1.9 mmol/L  Comprehensive metabolic panel  Result Value Ref Range   Sodium 144 135 - 145 mmol/L   Potassium 4.5 3.5 - 5.1 mmol/L   Chloride 107 98 - 111 mmol/L   CO2 24 22 - 32 mmol/L   Glucose, Bld 113 (H) 70 - 99 mg/dL   BUN 65 (H) 8 - 23 mg/dL   Creatinine, Ser 1.20 0.61 - 1.24 mg/dL   Calcium 8.3 (L) 8.9 - 10.3 mg/dL   Total Protein 4.3 (L) 6.5 - 8.1 g/dL   Albumin 2.0 (L) 3.5 - 5.0 g/dL   AST 60 (H) 15 - 41 U/L   ALT 52 (H) 0 - 44 U/L   Alkaline Phosphatase 94 38 - 126 U/L   Total Bilirubin 1.3 (H) 0.3 - 1.2 mg/dL   GFR calc non Af Amer 53 (L) >60 mL/min   GFR calc Af Amer >60 >60 mL/min   Anion gap 13 5 - 15  CBC WITH DIFFERENTIAL  Result Value Ref Range   WBC 10.5 4.0 - 10.5 K/uL   RBC 2.60 (L) 4.22 - 5.81 MIL/uL   Hemoglobin 8.7 (L) 13.0 - 17.0 g/dL   HCT 27.4 (L) 39.0 - 52.0 %   MCV 105.4 (H) 80.0 - 100.0 fL   MCH 33.5 26.0 - 34.0 pg   MCHC 31.8 30.0 - 36.0 g/dL   RDW 21.5 (H) 11.5 - 15.5 %   Platelets 105 (L) 150 - 400 K/uL   nRBC 1.1 (H) 0.0 - 0.2 %   Neutrophils Relative % 84 %   Neutro Abs 8.9 (H) 1.7 - 7.7 K/uL   Lymphocytes Relative 7 %   Lymphs Abs 0.7 0.7 - 4.0 K/uL   Monocytes Relative 3 %   Monocytes Absolute 0.3 0.1 - 1.0 K/uL   Eosinophils Relative 0 %   Eosinophils Absolute 0.0 0.0 - 0.5 K/uL   Basophils Relative 0 %   Basophils Absolute 0.0 0.0 - 0.1 K/uL   Immature Granulocytes 6 %   Abs Immature Granulocytes  0.59 (H) 0.00 - 0.07 K/uL   Polychromasia PRESENT   APTT  Result Value Ref Range   aPTT 33 24 - 36 seconds  Protime-INR  Result Value Ref Range   Prothrombin Time 23.8 (H) 11.4 - 15.2 seconds   INR 2.2 (H) 0.8 - 1.2  I-STAT 7, (LYTES, BLD GAS, ICA, H+H)  Result Value Ref Range   pH, Arterial 7.571 (H) 7.350 - 7.450   pCO2 arterial 27.4 (L) 32.0 - 48.0 mmHg   pO2, Arterial 63 (L) 83.0 - 108.0 mmHg   Bicarbonate 25.3 20.0 - 28.0 mmol/L   TCO2 26 22 - 32 mmol/L   O2 Saturation 95.0 %   Acid-Base Excess 3.0 (H) 0.0 - 2.0 mmol/L   Sodium 142 135 - 145 mmol/L   Potassium 4.0 3.5 - 5.1 mmol/L   Calcium, Ion 1.22 1.15 - 1.40 mmol/L   HCT 18.0 (L) 39.0 - 52.0 %   Hemoglobin 6.1 (LL) 13.0 - 17.0 g/dL   Patient temperature 97.9 F    Collection site Radial    Drawn by RT    Sample type ARTERIAL   Troponin I (High Sensitivity)  Result Value Ref Range   Troponin I (High Sensitivity) 60 (H) <18 ng/L   DG Elbow Complete Left  Result Date: 05/29/2019 CLINICAL DATA:  Fall, laceration to left elbow EXAM: LEFT ELBOW - COMPLETE 3+ VIEW COMPARISON:  None. FINDINGS: There is no evidence  of fracture, dislocation, or joint effusion. There is no evidence of arthropathy or other focal bone abnormality. Soft tissues are unremarkable. IMPRESSION: Negative. Electronically Signed   By: Rolm Baptise M.D.   On: 05/29/2019 10:54   DG Forearm Left  Result Date: 06/02/2019 CLINICAL DATA:  Swelling and tenderness.  oozing sores on forearm. EXAM: LEFT FOREARM - 2 VIEW COMPARISON:  None. FINDINGS: No fracture in the radius or ulna. A 7 mm lucency is identified in the anterior cortex of the radius, near the junction of the middle and distal thirds, indeterminate. No evidence for soft tissue gas. IMPRESSION: 7 mm indeterminate lucency in the anterior cortex of the mid radius. If the patient has a soft tissue sore in this region, MRI may be warranted to exclude osteomyelitis. Electronically Signed   By: Misty Stanley  M.D.   On: 06/02/2019 10:41   CT Head Wo Contrast  Result Date: 05/29/2019 CLINICAL DATA:  Head trauma.  Mechanical fall. EXAM: CT HEAD WITHOUT CONTRAST TECHNIQUE: Contiguous axial images were obtained from the base of the skull through the vertex without intravenous contrast. COMPARISON:  04/07/2019 MRI head. FINDINGS: Brain: No acute infarct or intracranial hemorrhage. Bilateral basal ganglia calcifications. Re-demonstration of small meningiomas overlying the right frontal and parietal convexities. No midline shift or extra-axial fluid collection. Vascular: No hyperdense vessel. Bilateral carotid siphon and V4 segment atherosclerotic calcifications. Skull: Negative for fracture or focal lesion. Sinuses/Orbits: Bilateral lens replacement. No mastoid effusion. Small left maxillary sinus mucous retention cyst. Other: None. IMPRESSION: No acute intracranial process. Mild cerebral atrophy. Unchanged small right cerebral convexity meningiomas. Electronically Signed   By: Primitivo Gauze M.D.   On: 05/29/2019 10:57   MR FOREARM LEFT W WO CONTRAST  Result Date: 06/03/2019 CLINICAL DATA:  Mid radial lesion on radiography, for further characterization. EXAM: MRI OF THE LEFT FOREARM WITHOUT AND WITH CONTRAST TECHNIQUE: Multiplanar, multisequence MR imaging of the left forearm was performed before and after the administration of intravenous contrast. CONTRAST:  5.29mL GADAVIST GADOBUTROL 1 MMOL/ML IV SOLN COMPARISON:  Radiographs from 06/02/2019 FINDINGS: Bones/Joint/Cartilage The small lucency in the radius has fatty signal in represents subtle endosteal scalloping possibly at a nutrient foramen. There is no abnormal enhancement in the bone or other specific worrisome bony characteristics, and the appearance is considered benign/incidental. No osteomyelitis is observed. Ligaments N/A Muscles and Tendons Field heterogeneity around the elbow and wrist which reduces sensitivity in assessing for edema. No intramuscular  abscess is identified. Soft tissues Along the dorsal superficial fascial margin and subcutaneous tissues of the distal forearm, and in the marked location, there is abnormal enhancement around a irregular 3.5 by 0.8 by 4.0 cm (volume = 6 cm^3) fluid collection in the subcutaneous tissues which appears to be draining to the cutaneous surface on image 40/13. The appearance suggests a small subcutaneous abscess draining to the skin surface. Uncertain patency of a small overlying superficial vein. IMPRESSION: 1. Small subcutaneous abscess along the dorsal superficial fascial margin of the distal forearm and in the marked location. This appears to likely be draining to the skin surface. No underlying osteomyelitis or intramuscular abscess. 2. Small lucency in the radius is considered benign/incidental, and represents a small incidental focus of endosteal scalloping. 3. Field heterogeneity around the elbow and wrist reduces sensitivity in assessing for in these regions. Electronically Signed   By: Van Clines M.D.   On: 06/03/2019 18:51   DG Chest Port 1 View  Result Date: 06/24/2019 CLINICAL DATA:  Shortness of breath,  tachycardia, tachypnea EXAM: PORTABLE CHEST 1 VIEW COMPARISON:  06/06/2019 FINDINGS: Single frontal view of the chest demonstrates a stable cardiac silhouette. Progressive bibasilar interstitial and ground-glass opacities. Trace bilateral effusions. No pneumothorax. IMPRESSION: 1. Progressive bibasilar airspace disease consistent with edema. Electronically Signed   By: Randa Ngo M.D.   On: 07/02/2019 02:12   DG CHEST PORT 1 VIEW  Result Date: 06/06/2019 CLINICAL DATA:  Chest pain.  Shortness of breath. EXAM: PORTABLE CHEST 1 VIEW COMPARISON:  05/29/2019.  04/04/2019. FINDINGS: Cardiomegaly. Diffuse bilateral pulmonary interstitial prominence and bilateral pleural effusions. Findings suggest CHF. Pneumonitis cannot be excluded. Degenerative change thoracic spine. IMPRESSION: Cardiomegaly  with diffuse bilateral interstitial prominence and bilateral pleural effusions suggesting CHF. Pneumonitis cannot be excluded. Electronically Signed   By: Marcello Moores  Register   On: 06/06/2019 13:56   DG CHEST PORT 1 VIEW  Result Date: 05/29/2019 CLINICAL DATA:  Pain following fall EXAM: PORTABLE CHEST 1 VIEW COMPARISON:  April 04, 2019 FINDINGS: There is atelectatic change in the left base. Lungs elsewhere are clear. Heart size and pulmonary vascularity are normal. No adenopathy. There is aortic atherosclerosis. No pneumothorax. No appreciable bone lesions. IMPRESSION: Left base atelectasis. Lungs elsewhere clear. Stable cardiac silhouette. No pneumothorax. Aortic Atherosclerosis (ICD10-I70.0). Electronically Signed   By: Lowella Grip III M.D.   On: 05/29/2019 13:27   ECHO TEE  Result Date: 06/02/2019    TRANSESOPHOGEAL ECHO REPORT   Patient Name:   Matthew Hickman Date of Exam: 06/02/2019 Medical Rec #:  [id number]      Height:       68.0 in Accession #:    [id number]     Weight:       124.6 lb Date of Birth:  Jun 20, 1925      BSA:          1.671 m Patient Age:    19 years       BP:           114/55 mmHg Patient Gender: M              HR:           81 bpm. Exam Location:  Inpatient Procedure: Transesophageal Echo, Cardiac Doppler and Color Doppler Indications:     Atrial fibrillation 427.31/I48.91  History:         Patient has prior history of Echocardiogram examinations, most                  recent 04/05/2019. Arrythmias:Atrial Flutter and Tachycardia;                  Risk Factors:Hypertension.  Sonographer:     Vikki Ports Turrentine Referring Phys:  [id number] Donato Heinz Diagnosing Phys: Oswaldo Milian MD PROCEDURE: The transesophogeal probe was passed without difficulty through the esophogus of the patient. Sedation performed by different physician. The patient was monitored while under deep sedation. Anesthestetic sedation was provided intravenously by Anesthesiology: 209mg  of Propofol. The  patient developed no complications during the procedure. IMPRESSIONS  1. Left ventricular ejection fraction, by estimation, is 55 to 60%. The left ventricle has normal function. The left ventricle has no regional wall motion abnormalities.  2. Right ventricular systolic function is normal. The right ventricular size is normal.  3. The mitral valve is normal in structure. Mild mitral valve regurgitation.  4. The aortic valve is tricuspid. Aortic valve regurgitation is mild. No aortic stenosis is present.  5. Aortic dilatation noted. There is mild dilatation of the aortic  root measuring 40 mm.  6. No left atrial/left atrial appendage thrombus was detected. FINDINGS  Left Ventricle: Left ventricular ejection fraction, by estimation, is 55 to 60%. The left ventricle has normal function. The left ventricle has no regional wall motion abnormalities. The left ventricular internal cavity size was normal in size. There is  no left ventricular hypertrophy. Right Ventricle: The right ventricular size is normal. Right vetricular wall thickness was not assessed. Right ventricular systolic function is normal. Left Atrium: Left atrial size was normal in size. No left atrial/left atrial appendage thrombus was detected. Right Atrium: Right atrial size was normal in size. Pericardium: There is no evidence of pericardial effusion. Mitral Valve: The mitral valve is normal in structure. Mild mitral valve regurgitation. Tricuspid Valve: The tricuspid valve is normal in structure. Tricuspid valve regurgitation is mild. Aortic Valve: The aortic valve is tricuspid. Aortic valve regurgitation is mild. No aortic stenosis is present. Pulmonic Valve: The pulmonic valve was grossly normal. Pulmonic valve regurgitation is mild. Aorta: Aortic dilatation noted. There is mild dilatation of the aortic root measuring 40 mm. IAS/Shunts: No atrial level shunt detected by color flow Doppler. Oswaldo Milian MD Electronically signed by Oswaldo Milian MD Signature Date/Time: 06/02/2019/3:46:30 PM    Final     EKG EKG Interpretation  Date/Time:  Saturday Jun 10 2019 01:43:24 EDT Ventricular Rate:  117 PR Interval:    QRS Duration: 130 QT Interval:  371 QTC Calculation: 505 R Axis:   30 Text Interpretation: Atrial fibrillation Ventricular premature complex IVCD, consider atypical RBBB Artifact in lead(s) I II III aVR aVL V1 Confirmed by Randal Buba, Jurnie Garritano ([id number]) on 06/21/2019 2:21:28 AM   Radiology DG Chest Port 1 View  Result Date: 06/25/2019 CLINICAL DATA:  Shortness of breath, tachycardia, tachypnea EXAM: PORTABLE CHEST 1 VIEW COMPARISON:  06/06/2019 FINDINGS: Single frontal view of the chest demonstrates a stable cardiac silhouette. Progressive bibasilar interstitial and ground-glass opacities. Trace bilateral effusions. No pneumothorax. IMPRESSION: 1. Progressive bibasilar airspace disease consistent with edema. Electronically Signed   By: Randa Ngo M.D.   On: 06/04/2019 02:12    Procedures Procedures (including critical care time)  Medications Ordered in ED Medications  vancomycin (VANCOCIN) IVPB 1000 mg/200 mL premix (1,000 mg Intravenous New Bag/Given 07/02/2019 0443)  furosemide (LASIX) injection 40 mg (has no administration in time range)  glucagon (human recombinant) (GLUCAGEN) injection 2 mg (has no administration in time range)  ceFEPIme (MAXIPIME) 2 g in sodium chloride 0.9 % 100 mL IVPB (0 g Intravenous Stopped 07/02/2019 0256)  metroNIDAZOLE (FLAGYL) IVPB 500 mg (0 mg Intravenous Stopped 06/27/2019 0410)    ED Course  I have reviewed the triage vital signs and the nursing notes.  Pertinent labs & imaging results that were available during my care of the patient were reviewed by me and considered in my medical decision making (see chart for details).    MDM Reviewed: previous chart, nursing note and vitals Reviewed previous: labs, ECG and x-ray Interpretation: labs, ECG and x-ray (pulmonary edema by me on cxr  slight elevation of lactate) Total time providing critical care: 75-105 minutes (bipap initiated ). This excludes time spent performing separately reportable procedures and services. Consults: critical care and cardiology (cardiology to see the patient in consult case d/w fellow on call at 544 am )  CRITICAL CARE Performed by: Tysheem Accardo K Dayron Odland-Rasch Total critical care time: 75 minutes Critical care time was exclusive of separately billable procedures and treating other patients. Critical care was necessary to treat or  prevent imminent or life-threatening deterioration. Critical care was time spent personally by me on the following activities: development of treatment plan with patient and/or surrogate as well as nursing, discussions with consultants, evaluation of patient's response to treatment, examination of patient, obtaining history from patient or surrogate, ordering and performing treatments and interventions, ordering and review of laboratory studies, ordering and review of radiographic studies, pulse oximetry and re-evaluation of patient's condition.  Final Clinical Impression(s) / ED Diagnoses Final diagnoses:  SIRS (systemic inflammatory response syndrome) (HCC)  Acute pulmonary edema (HCC)  Atrial fibrillation, unspecified type ([place])   Hold IVF due to acute pulmonary I believe this is more likely pulmonary edema and medication effect as well as persistent afib/flutter Admit to intensive care    Gunda Maqueda, MD 06/18/2019 [place], Keyleen Cerrato, MD 06/13/2019 [id number]

## 2019-06-10 NOTE — Consult Note (Signed)
Consultation Note Date: 06/08/2019   Patient Name: Matthew Hickman  DOB: 03/23/1925  MRN: [id number]  Age / Sex: 86 y.o., male  PCP: Matthew Gravel, MD Referring Physician: Jacalyn Lefevre, MD  Reason for Consultation: Establishing goals of care and Psychosocial/spiritual support  HPI/Patient Profile: 86 y.o. male  with past medical history of giant cell arteritis on chronic prednisone therapy, heart failure, thrombocytopenia, A. fib/RVR, chronic AC use, acute kidney injury 3 hospitalizations in 6 months, admitted on 06/09/2019 with acute hypoxemic respiratory failure and frail elderly patient with multiple comorbidities, including bilateral arm abscesses/cellulitis and UTI.  Patient discharged 5/5 and readmitted 5/8 from [facility].   Clinical Assessment and Goals of Care:  I have reviewed medical records including EPIC notes, labs and imaging, received report from bedside nursing staff, and examined the patient.   [name] is lying quietly in bed with BiPAP in place.  He appears acutely/chronically ill and frail.  I am unable to have meaningful conversation with him due to respiratory status.  There is no family at bedside at this time.  Detail conference with bedside nursing staff related to patient condition, needs.   I return later in the day to find that he is now on [facility], remains lethargic, and family has left bedside.  Call to daughter/HCPOA Melissa to discuss diagnosis prognosis, GOC, EOL wishes, disposition and options.   I introduced Palliative Medicine as specialized medical care for people living with serious illness. It focuses on providing relief from the symptoms and stress of a serious illness.   We discussed a brief life review of the patient. 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  Melissa states he has always been thin and mindful of weight and calories.   As far as  functional and nutritional status, [name] was living in his own home with his wife March of this year.  He has been at [facility] for rehab after recent hospitalization.   We discussed current illness and what it means in the larger context of on-going co-morbidities.  Natural disease trajectory and expectations at EOL were discussed.  I attempted to elicit values and goals of care important to the patient.  Advanced directives, concepts specific to code status, artifical feeding and hydration, and rehospitalization were considered and discussed.  [name] has shared with his family that he would want artificial hydration if he had a good chance at meaningful recovery.  [name] shares that they have had many discussions about his [place].   The difference between aggressive medical intervention and comfort care was considered in light of the patient's goals of care. Melissa states they would like to focus on comfort if [name] does not have meaningful recovery in 24-48 hours.  I encourage her to lean on ICU staff and doctors for information and guidance.   Questions and concerns were addressed.  The family was encouraged to call with questions or concerns.   HCPOA    HCPOA -daughter Tollie Pizza is [facility]  POA.  Also daughter Deotis Boodram involved in care.  [name] has spouse, Kahaan Alvizo listed in chart.    SUMMARY OF RECOMMENDATIONS   24-48 hours for outcomes.  Treat the treatable.  If he deteriorates, daughter/HC POA Melissa would request comfort measures.   Code Status/Advance Care Planning:  DNR  Symptom Management:   Per hospitalist/CCM, no additional needs at this time.  Palliative Prophylaxis:   Oral Care and Turn Reposition  Additional Recommendations (Limitations, Scope, Preferences):  Treat the treatable but no CPR or intubation.  Psycho-social/Spiritual:   Desire for further Chaplaincy support:no  Additional Recommendations: Caregiving  Support/Resources and  Education on Hospice  Prognosis:   Unable to determine, guarded.  Based on outcomes.  3 to 6 months or less would not be surprising based on 3 hospitalizations in the last 6 months, chronic disease burden, frailty, albumin 2.0, previously 2.7 March of this year  Discharge Planning: To be determined, based on outcomes.      Primary Diagnoses: Present on Admission: . Respiratory failure ([place])   I have reviewed the medical record, interviewed the patient and family, and examined the patient. The following aspects are pertinent.  Past Medical History:  Diagnosis Date  . Cancer ([place])    skin  . Hypertension    Social History   Socioeconomic History  . Marital status: Married    Spouse name: Not on file  . Number of children: Not on file  . Years of education: Not on file  . Highest education level: Not on file  Occupational History  . Not on file  Tobacco Use  . Smoking status: Never Smoker  . Smokeless tobacco: Never Used  Substance and Sexual Activity  . Alcohol use: Yes    Comment: 1 glass of bourbon per night  . Drug use: Never  . Sexual activity: Not on file  Other Topics Concern  . Not on file  Social History Narrative  . Not on file   Social Determinants of Health   Financial Resource Strain:   . Difficulty of Paying Living Expenses:   Food Insecurity:   . Worried About Charity fundraiser in the Last Year:   . Arboriculturist in the Last Year:   Transportation Needs:   . Film/video editor (Medical):   Marland Kitchen Lack of Transportation (Non-Medical):   Physical Activity:   . Days of Exercise per Week:   . Minutes of Exercise per Session:   Stress:   . Feeling of Stress :   Social Connections:   . Frequency of Communication with Friends and Family:   . Frequency of Social Gatherings with Friends and Family:   . Attends Religious Services:   . Active Member of Clubs or Organizations:   . Attends Archivist Meetings:   Marland Kitchen Marital Status:     Family History  Problem Relation Age of Onset  . Heart attack Father        Died at age 25  . Heart failure Father    Scheduled Meds: . apixaban  2.5 mg Oral BID  . atovaquone  750 mg Oral BID WC  . furosemide  40 mg Intravenous Once  . methylPREDNISolone (SOLU-MEDROL) injection  60 mg Intravenous Q12H  . pantoprazole  20 mg Oral Daily   Continuous Infusions: . albumin human 60 mL/hr at 06/29/2019 1000  . ceFEPime (MAXIPIME) IV    . [START ON 06/12/2019] vancomycin     PRN Meds:.albuterol, docusate sodium,  polyethylene glycol Medications Prior to Admission:  Prior to Admission medications   Medication Sig Start Date End Date Taking? Authorizing Provider  Amino Acids-Protein Hydrolys (FEEDING SUPPLEMENT, PRO-STAT SUGAR FREE 64,) LIQD Take 30 mLs by mouth 2 (two) times daily with a meal.   Yes [provider]  amiodarone (PACERONE) 400 MG tablet Take 1 tablet (400 mg total) by mouth 2 (two) times daily. 06/07/19  Yes Hosie Poisson, MD  amoxicillin-clavulanate (AUGMENTIN) 875-125 MG tablet Take 1 tablet by mouth every 12 (twelve) hours for 5 days. 06/07/19 06/12/19 Yes Hosie Poisson, MD  apixaban (ELIQUIS) 2.5 MG TABS tablet Take 1 tablet (2.5 mg total) by mouth 2 (two) times daily. 06/07/19  Yes Hosie Poisson, MD  folic acid (FOLVITE) 1 MG tablet Take 1 tablet (1 mg total) by mouth daily. 06/07/19  Yes Hosie Poisson, MD  metoprolol tartrate (LOPRESSOR) 100 MG tablet Take 1 tablet (100 mg total) by mouth 2 (two) times daily. 06/07/19  Yes Hosie Poisson, MD  Multiple Vitamin (MULTIVITAMIN WITH MINERALS) TABS tablet Take 1 tablet by mouth daily.   Yes [provider]  pantoprazole (PROTONIX) 20 MG tablet Take 1 tablet (20 mg total) by mouth daily. 04/11/19  Yes Guilford Shi, MD  predniSONE (DELTASONE) 20 MG tablet Take 20 mg by mouth daily.  05/19/19  Yes [provider]  saccharomyces boulardii (FLORASTOR) 250 MG capsule Take 1 capsule (250 mg total) by mouth 2 (two)  times daily. 06/07/19  Yes Hosie Poisson, MD  terazosin (HYTRIN) 1 MG capsule Take 1 mg by mouth at bedtime.   Yes [provider]  thiamine 100 MG tablet Take 1 tablet (100 mg total) by mouth daily. 06/07/19  Yes Hosie Poisson, MD  feeding supplement, ENSURE ENLIVE, (ENSURE ENLIVE) LIQD Take 237 mLs by mouth 3 (three) times daily between meals. Patient not taking: Reported on 06/29/2019 04/10/19   Guilford Shi, MD   Allergies  Allergen Reactions  . Tape Other (See Comments)    PATIENT'S SKIN IS THIN AND IT TEARS EASILY   Review of Systems  Unable to perform ROS: Severe respiratory distress    Physical Exam Vitals and nursing note reviewed.  Constitutional:      General: He is in acute distress.     Appearance: He is ill-appearing.  Cardiovascular:     Comments: Tacky/bradycardia Pulmonary:     Comments: BiPAP in place Abdominal:     Palpations: Abdomen is soft.  Musculoskeletal:     Comments: Thin and frail, muscle wasting  Skin:    General: Skin is warm and dry.  Neurological:     Mental Status: He is alert.     Comments: BiPAP in place, unable to ask orientation questions  Psychiatric:     Comments: Calm and cooperative     Vital Signs: BP (!) 126/55   Pulse (!) 41   Temp 97.8 F (36.6 C) (Oral)   Resp 20   Ht 5\' 8"  (1.727 m)   Wt 54.6 kg   SpO2 (!) 85%   BMI 18.30 kg/m  Pain Scale: CPOT       SpO2: SpO2: (!) 85 % O2 Device:SpO2: (!) 85 % O2 Flow Rate: .O2 Flow Rate (L/min): 15 L/min  IO: Intake/output summary:   Intake/Output Summary (Last 24 hours) at 06/11/2019 1119 Last data filed at 06/19/2019 1000 Gross per 24 hour  Intake 41.77 ml  Output --  Net 41.77 ml    LBM: Last BM Date: 06/28/2019 Baseline Weight: Weight:  56 kg Most recent weight: Weight: 54.6 kg     Palliative Assessment/Data:   Flowsheet Rows     Most Recent Value  Intake Tab  Referral Department  Hospitalist  Unit at Time of Referral  ICU  Palliative Care Primary  Diagnosis  Sepsis/Infectious Disease  Date Notified  06/11/2019  Palliative Care Type  New Palliative care  Reason for referral  Clarify Goals of Care  Date of Admission  06/20/2019  Date first seen by Palliative Care  06/21/2019  # of days Palliative referral response time  0 Day(s)  # of days IP prior to Palliative referral  0  Clinical Assessment  Palliative Performance Scale Score  20%  Pain Max last 24 hours  Not able to report  Pain Min Last 24 hours  Not able to report  Dyspnea Max Last 24 Hours  Not able to report  Dyspnea Min Last 24 hours  Not able to report  Psychosocial & Spiritual Assessment  Palliative Care Outcomes      Time In: 1510 Time Out: 1620 Time Total: 70 minutes  Greater than 50%  of this time was spent counseling and coordinating care related to the above assessment and plan.  Signed by: Drue Novel, NP   Please contact Palliative Medicine Team phone at [phone] for questions and concerns.  For individual provider: See Shea Evans

## 2019-06-11 ENCOUNTER — Inpatient Hospital Stay (HOSPITAL_COMMUNITY): Payer: Medicare Other

## 2019-06-11 DIAGNOSIS — J810 Acute pulmonary edema: Secondary | ICD-10-CM

## 2019-06-11 DIAGNOSIS — I4891 Unspecified atrial fibrillation: Secondary | ICD-10-CM

## 2019-06-11 LAB — CBC
HCT: 21.5 % — ABNORMAL LOW (ref 39.0–52.0)
HCT: 28.2 % — ABNORMAL LOW (ref 39.0–52.0)
Hemoglobin: 6.8 g/dL — CL (ref 13.0–17.0)
Hemoglobin: 9 g/dL — ABNORMAL LOW (ref 13.0–17.0)
MCH: 33.8 pg (ref 26.0–34.0)
MCH: 32.3 pg (ref 26.0–34.0)
MCHC: 31.6 g/dL (ref 30.0–36.0)
MCHC: 31.9 g/dL (ref 30.0–36.0)
MCV: 107 fL — ABNORMAL HIGH (ref 80.0–100.0)
MCV: 101.1 fL — ABNORMAL HIGH (ref 80.0–100.0)
Platelets: 97 10*3/uL — ABNORMAL LOW (ref 150–400)
Platelets: 96 10*3/uL — ABNORMAL LOW (ref 150–400)
RBC: 2.01 MIL/uL — ABNORMAL LOW (ref 4.22–5.81)
RBC: 2.79 MIL/uL — ABNORMAL LOW (ref 4.22–5.81)
RDW: 21.3 % — ABNORMAL HIGH (ref 11.5–15.5)
RDW: 23.7 % — ABNORMAL HIGH (ref 11.5–15.5)
WBC: 8 10*3/uL (ref 4.0–10.5)
WBC: 10.3 10*3/uL (ref 4.0–10.5)
nRBC: 0.4 % — ABNORMAL HIGH (ref 0.0–0.2)
nRBC: 0.5 % — ABNORMAL HIGH (ref 0.0–0.2)

## 2019-06-11 LAB — GLUCOSE, CAPILLARY
Glucose-Capillary: 110 mg/dL — ABNORMAL HIGH (ref 70–99)
Glucose-Capillary: 115 mg/dL — ABNORMAL HIGH (ref 70–99)
Glucose-Capillary: 128 mg/dL — ABNORMAL HIGH (ref 70–99)
Glucose-Capillary: 137 mg/dL — ABNORMAL HIGH (ref 70–99)
Glucose-Capillary: 150 mg/dL — ABNORMAL HIGH (ref 70–99)
Glucose-Capillary: 153 mg/dL — ABNORMAL HIGH (ref 70–99)

## 2019-06-11 LAB — COMPREHENSIVE METABOLIC PANEL
ALT: 38 U/L (ref 0–44)
AST: 45 U/L — ABNORMAL HIGH (ref 15–41)
Albumin: 2.9 g/dL — ABNORMAL LOW (ref 3.5–5.0)
Alkaline Phosphatase: 74 U/L (ref 38–126)
Anion gap: 13 (ref 5–15)
BUN: 64 mg/dL — ABNORMAL HIGH (ref 8–23)
CO2: 22 mmol/L (ref 22–32)
Calcium: 8.7 mg/dL — ABNORMAL LOW (ref 8.9–10.3)
Chloride: 113 mmol/L — ABNORMAL HIGH (ref 98–111)
Creatinine, Ser: 1.18 mg/dL (ref 0.61–1.24)
GFR calc Af Amer: >60 mL/min (ref >60)
GFR calc non Af Amer: 54 mL/min — ABNORMAL LOW (ref >60)
Glucose, Bld: 127 mg/dL — ABNORMAL HIGH (ref 70–99)
Potassium: 4.1 mmol/L (ref 3.5–5.1)
Sodium: 148 mmol/L — ABNORMAL HIGH (ref 135–145)
Total Bilirubin: 1.6 mg/dL — ABNORMAL HIGH (ref 0.3–1.2)
Total Protein: 4.9 g/dL — ABNORMAL LOW (ref 6.5–8.1)

## 2019-06-11 LAB — PHOSPHORUS: Phosphorus: 5 mg/dL — ABNORMAL HIGH (ref 2.5–4.6)

## 2019-06-11 LAB — PREPARE RBC (CROSSMATCH)

## 2019-06-11 LAB — HEMOGLOBIN AND HEMATOCRIT, BLOOD
HCT: 20.7 % — ABNORMAL LOW (ref 39.0–52.0)
Hemoglobin: 6.6 g/dL — CL (ref 13.0–17.0)

## 2019-06-11 LAB — MAGNESIUM: Magnesium: 2.6 mg/dL — ABNORMAL HIGH (ref 1.7–2.4)

## 2019-06-11 MED ORDER — SODIUM CHLORIDE 0.9% IV SOLUTION: Freq: Once | INTRAVENOUS | Status: AC

## 2019-06-11 MED ORDER — METHYLPREDNISOLONE SODIUM SUCC 125 MG IJ SOLR
40.0000 mg | Freq: Two times a day (BID) | INTRAMUSCULAR | Status: DC
  Administered 2019-06-11 – 2019-06-12 (×2): 40 mg via INTRAVENOUS
  Filled 2019-06-11 (×2): qty 2

## 2019-06-11 MED ORDER — PANTOPRAZOLE SODIUM 40 MG IV SOLR
40.0000 mg | INTRAVENOUS | Status: DC
  Administered 2019-06-11 – 2019-06-13 (×3): 40 mg via INTRAVENOUS
  Filled 2019-06-11 (×3): qty 40

## 2019-06-11 MED ORDER — DIGOXIN 0.25 MG/ML IJ SOLN
0.2500 mg | Freq: Once | INTRAMUSCULAR | Status: AC
  Administered 2019-06-11: 0.25 mg via INTRAVENOUS
  Filled 2019-06-11: qty 2

## 2019-06-11 MED ORDER — RESOURCE THICKENUP CLEAR PO POWD
ORAL | Status: DC | PRN
  Filled 2019-06-11: qty 125

## 2019-06-11 NOTE — Progress Notes (Signed)
CRITICAL VALUE ALERT  Critical Value:  Hgb 6.6  Date & Time Notied:  06/11/19 @ [id number]  Provider Notified: Warren Lacy  Orders Received/Actions taken: TBD

## 2019-06-11 NOTE — Progress Notes (Signed)
Modified Barium Swallow Progress Note  Patient Details  Name: Matthew Hickman MRN: [id number] Date of Birth: 1925-12-26  Today's Date: 06/11/2019  Modified Barium Swallow completed.  Full report located under Chart Review in the Imaging Section.  Brief recommendations include the following:  Clinical Impression  Pt was seen for a modified barium swallow study and he presents with moderate oropharyngeal dysphagia with resultant aspiration of thin liquid via straw sip on today's examination.    Laryngeal penetration occurred before the swallow and it resulted in aspiration during/after the swallow secondary to delayed swallow initiation/laryngeal closure.  Aspiration was sensed, but pt was unable to clear aspirated material despite spontaneous and cued coughing.  Laryngeal penetration/aspiration was not observed with nectar-thick liquid, puree, or thin liquid via tsp; however moderate-severe vallecular residue, mild pyriform sinus residue, and trace posterior pharyngeal wall residue was noted across most PO trials.  Pt was unable to fully clear pharyngeal residue despite multiple swallows which places him at an increased risk for aspiration with all PO intake.  Pharyngeal phase was remarkable for reduced BOT retraction, reduced hyolaryngeal excursion, and reduced pharyngeal constriction, resulting in the beforementioned pharyngeal residue.  Oral phase was remarkable for reduced lingual control resulting in premature spillage to the pharynx and reduced lingual strength resulting in trace-moderate oral residue.  Pharyngoesophageal phase was remarkable for esophageal backflow into the pyriform sinus and suspected reduced duration of UES opening with questionable CP bar (radiologist was not present to confirm).    Recommend that pt begin snacks of Dysphagia 1 (puree) solids and nectar-thick liquids from the floor stock with full RN supervision and strict adherence to compensatory strategies.  Pt may have a  few small spoon sips of water PRN following thorough oral care given full RN supervision.  If pt/family wish to pursue a liberalized diet with comfort measures, would recommend Dysphagia 1 (puree) solids and thin liquids.     Swallow Evaluation Recommendations   Recommended Consults: Consider GI evaluation   SLP Diet Recommendations: (Snacks of puree and nectar-thick liquid )   Liquid Administration via: Spoon;Straw   Medication Administration: Via alternative means   Supervision: Full assist for feeding;Full supervision/cueing for compensatory strategies   Compensations: Small sips/bites;Slow rate;Minimize environmental distractions;Multiple dry swallows after each bite/sip;Effortful swallow   Postural Changes: Remain semi-upright after after feeds/meals (Comment);Seated upright at 90 degrees   Oral Care Recommendations: Oral care QID;Staff/trained caregiver to provide oral care;Oral care before and after PO   Other Recommendations: Have oral suction available;Remove water pitcher;Prohibited food (jello, ice cream, thin soups)   Colin Mulders., M.S., CCC-SLP Acute Rehabilitation Services Office: [phone]  Elvia Collum Daiana Vitiello 06/11/2019,2:22 PM

## 2019-06-11 NOTE — Progress Notes (Signed)
eLink Physician-Brief Progress Note Patient Name: Matthew Hickman DOB: 20-Apr-1925 MRN: [id number]   Date of Service  06/11/2019  HPI/Events of Note  Hgb 6.6 (this is the re-check).  eICU Interventions  Ordered 1u pRBCs. RN is going to make sure that consent has already been signed for blood transfusions, otherwise family will be called for consent first.     Intervention Category Intermediate Interventions: Other:  Charlott Rakes 06/11/2019, 6:46 AM

## 2019-06-11 NOTE — Progress Notes (Signed)
[facility] Progress Note Patient Name: DONALD NORDMARK DOB: 02-23-1925 MRN: [id number]   Date of Service  06/11/2019  HPI/Events of Note  Hgb 6.8. Had a low aberrant value yesterday as well which was >7 when rechecked.   eICU Interventions  Redraw H/H. Will transfuse if recheck is less than 7.0     Intervention Category Minor Interventions: Other:  Charlott Rakes 06/11/2019, 3:20 AM

## 2019-06-11 NOTE — Progress Notes (Signed)
  Speech Language Pathology Treatment: Dysphagia  Patient Details Name: Matthew Hickman MRN: [id number] DOB: 05-12-1925 Today's Date: 06/11/2019 Time: [id number] SLP Time Calculation (min) (ACUTE ONLY): 10 min  Assessment / Plan / Recommendation Clinical Impression  Pt was seen for skilled ST targeting diagnostic tx.  He was more alert today in comparison to yesterday's evaluation.  Pt was pleasant and cooperative throughout this session; however, he appeared to be confused as he asked the SLP how old he was.  He consumed trials of thin liquid (tsp/straw) and puree.  He presented with reduced labial closure around the spoon and straw, but he achieved full labial closure during the oral preparation phase of the swallow.  AP transport appeared to be prolonged and suspect delayed swallow initiation with all trials.  Immediate/delayed throat clearing was observed intermittently with liquid and puree trials, and an immediate cough was noted following 1/2 straw sips of thin liquid.  Recommend an instrumental swallow study to further evaluate swallow function and to help determine the safest/least restrictive diet.  Will plan for MBS later today.  Additionally recommend that pt remain NPO with frequent oral care and small sips of water PRN with full RN supervision until MBS is completed.     HPI HPI: Matthew Hickman is a 86 y.o. male with a hx of HTN, skin cancer and giant cell arteritis, a fib with recent discharge 06/07/19  who is being seen today for the evaluation of Acute CHF.  CXR reported: "Progressive bibasilar airspace disease consistent with edema."       SLP Plan  MBS       Recommendations  Diet recommendations: NPO Liquids provided via: Teaspoon Medication Administration: Via alternative means Supervision: Patient able to self feed;Full supervision/cueing for compensatory strategies Compensations: Small sips/bites                Oral Care Recommendations: Oral care QID;Oral care prior  to ice chip/H20;Staff/trained caregiver to provide oral care Follow up Recommendations: Skilled Nursing facility;24 hour supervision/assistance SLP Visit Diagnosis: Dysphagia, unspecified (R13.10) Plan: MBS       GO               Colin Mulders., M.S., [place] Acute Rehabilitation Services Office: [phone]  [place] 06/11/2019, 11:01 AM

## 2019-06-11 NOTE — Progress Notes (Signed)
Progress Note  Patient Name: Matthew Hickman Date of Encounter: 06/11/2019  Primary Cardiologist: Sherren Mocha, MD   Subjective   No chest pain or sob.  Inpatient Medications    Scheduled Meds: . chlorhexidine  15 mL Mouth Rinse BID  . Chlorhexidine Gluconate Cloth  6 each Topical Daily  . enoxaparin (LOVENOX) injection  50 mg Subcutaneous Q12H  . mouth rinse  15 mL Mouth Rinse q12n4p  . methylPREDNISolone (SOLU-MEDROL) injection  60 mg Intravenous Q12H  . pantoprazole (PROTONIX) IV  40 mg Intravenous Q24H   Continuous Infusions: . sodium chloride Stopped (06/11/19 0807)   PRN Meds: sodium chloride, albuterol, docusate sodium, polyethylene glycol   Vital Signs    Vitals:   06/11/19 0800 06/11/19 0829 06/11/19 1007 06/11/19 1030  BP: 115/65 118/66 123/76 125/73  Pulse: (!) 115 (!) 120 (!) 126 (!) 129  Resp: 19 (!) 22 (!) 21 (!) 23  Temp: (!) 96.8 F (36 C) (!) 96.7 F (35.9 C) (!) 96.6 F (35.9 C)   TempSrc: Axillary Axillary Axillary   SpO2: 96% 97% 90% 94%  Weight:      Height:        Intake/Output Summary (Last 24 hours) at 06/11/2019 1057 Last data filed at 06/11/2019 1005 Gross per 24 hour  Intake 1373.28 ml  Output 490 ml  Net 883.28 ml   Filed Weights   06/07/2019 0138 06/07/2019 0919 06/11/19 0500  Weight: 56 kg 54.6 kg 53.9 kg    Telemetry    Atrial fib/flutter with a RVR - Personally Reviewed  ECG    none - Personally Reviewed  Physical Exam   GEN: frail, no acute distress.   Neck: 7 cm JVD Cardiac: Reg tachy, no murmurs, rubs, or gallops.  Respiratory: scattered rales GI: Soft, nontender, non-distended  MS: No edema; No deformity. Neuro:  Nonfocal  Psych: Normal affect   Labs    Chemistry Recent Labs  Lab 06/07/19 0302 06/07/19 0302 06/11/2019 0219 06/19/2019 0520 06/11/19 0221  NA 139   < > 144 142 148*  K 4.3   < > 4.5 4.0 4.1  CL 105  --  107  --  113*  CO2 24  --  24  --  22  GLUCOSE 198*  --  113*  --  127*  BUN 40*  --   65*  --  64*  CREATININE 0.85  --  1.20  --  1.18  CALCIUM 8.8*  --  8.3*  --  8.7*  PROT  --   --  4.3*  --  4.9*  ALBUMIN  --   --  2.0*  --  2.9*  AST  --   --  60*  --  45*  ALT  --   --  52*  --  38  ALKPHOS  --   --  94  --  74  BILITOT  --   --  1.3*  --  1.6*  GFRNONAA >60  --  53*  --  54*  GFRAA >60  --  >60  --  >60  ANIONGAP 10  --  13  --  13   < > = values in this interval not displayed.     Hematology Recent Labs  Lab 07/03/2019 0219 06/04/2019 0520 06/29/2019 0745 06/11/19 0221 06/11/19 0424  WBC 10.5  --  13.9* 8.0  --   RBC 2.60*  --  2.18* 2.01*  --   HGB 8.7*   < >  7.4* 6.8* 6.6*  HCT 27.4*   < > 23.3* 21.5* 20.7*  MCV 105.4*  --  106.9* 107.0*  --   MCH 33.5  --  33.9 33.8  --   MCHC 31.8  --  31.8 31.6  --   RDW 21.5*  --  21.4* 21.3*  --   PLT 105*  --  100* 97*  --    < > = values in this interval not displayed.    Cardiac EnzymesNo results for input(s): TROPONINI in the last 168 hours. No results for input(s): TROPIPOC in the last 168 hours.   BNP Recent Labs  Lab 06/07/2019 0745  BNP 127.0*     DDimer No results for input(s): DDIMER in the last 168 hours.   Radiology    DG Chest Port 1 View  Result Date: 06/04/2019 CLINICAL DATA:  Shortness of breath, tachycardia, tachypnea EXAM: PORTABLE CHEST 1 VIEW COMPARISON:  06/06/2019 FINDINGS: Single frontal view of the chest demonstrates a stable cardiac silhouette. Progressive bibasilar interstitial and ground-glass opacities. Trace bilateral effusions. No pneumothorax. IMPRESSION: 1. Progressive bibasilar airspace disease consistent with edema. Electronically Signed   By: Randa Ngo M.D.   On: 06/05/2019 02:12    Cardiac Studies   none  Patient Profile     86 y.o. male admitted with acute CHF and atrial fib/flutter with a RVR  Assessment & Plan    1. Atrial fib/LA flutter - his rates remain increased. I will order a single dose of IV digoxin. 2. Acute on chronic diastolic heart failure -  he appears a little better today. Less dyspneic and more alert.   Latrese Carolan,M.D.     For questions or updates, please contact [place] Please consult [email or web address] for contact info under Cardiology/STEMI.      Signed, Cristopher Peru, MD  06/11/2019, 10:57 AM

## 2019-06-11 NOTE — Progress Notes (Signed)
CRITICAL VALUE ALERT  Critical Value:  Hgb 638  Date & Time Notied:  06/11/19 @ 0304  Provider Notified: Warren Lacy  Orders Received/Actions taken: TBD

## 2019-06-11 NOTE — Progress Notes (Addendum)
NAME:  Matthew Hickman, MRN:  [id number], DOB:  [birth year], LOS: 1 ADMISSION DATE:  06/29/2019, CONSULTATION DATE:  06/11/19 REFERRING MD:  EDP, CHIEF COMPLAINT:  Dyspnea   Brief History   86 year old male with a history of paroxysmal atrial fibrillation, HFpEF, giant cell arteritis on chronic prednisone who was brought in from SNF for shortness of breath.  Possible pulmonary edema versus sepsis, required BiPAP in the ED and blood pressure borderline, so PCCM consulted for admission  History of present illness   Matthew Hickman is a 86 year old male with past medical history of paroxysmal atrial fibrillation, hypertension, HFpEF, and giant cell arteritis on chronic prednisone who presents from SNF for dyspnea.  Was recently admitted and diagnosed with atrial fibrillation requiring 10 and initiated on amiodarone, Lopressor and Eliquis and discharged on 06/07/19.  He was also treated for left forearm cellulitis and Pseudomonas UTI, discharged on Cipro and Augmentin.  Per ED report, patient was feeling short of breath all day and was on 4 L nonrebreather mask on EMS arrival.  Hypotensive with SBP in the 80s, given approximately 500 cc IV fluids.  Systolic improved 95-621 at the time of admission.  Chest x-ray showed likely pulmonary edema, minimally changed from prior film.  Lactic acid 2.1, no leukocytosis, very mildly elevated LFTs, urinalysis and respiratory viral panel negative.  EKG showed atrial fibrillation, rate 120s.  ABG 7.57/27 point 4/63/20 5.3. Patient  was awake and alert at the time of admission, he was confused, but yelling and protecting his airway well and tolerating BiPAP.  He was given vancomycin, cefepime, Flagyl, glucagon to reverse beta-blocker, and Lasix 40 mg and PCCM consulted for admission   Past Medical History   has a past medical history of Cancer ([place]) and Hypertension.  Atrial fibrillation, GCA, HFpEF  Significant Hospital Events   06/11/19 Admit to PCCM  Consults:     Procedures:    Significant Diagnostic Tests:  06/11/19 CXR>>Progressive bibasilar airspace disease consistent with edema.  Micro Data:  06/11/19 RVP>>negative 06/11/19 BCx2>> 06/11/19 UC>>  Antimicrobials:  Cefepime 5/8- Vancomycin 5/8- Flagyl 5/8  Interim history/subjective:   Patient with no acute events overnight.  Remains off BiPAP.  However does have a new lower hemoglobin.  Transfuse 1 unit of blood overnight.  This is still running this morning.  Objective   Blood pressure 125/73, pulse (!) 129, temperature (!) 97.4 F (36.3 C), temperature source Oral, resp. rate (!) 23, height 5' 8" (1.727 m), weight 53.9 kg, SpO2 94 %.    FiO2 (%):  [40 %] 40 %   Intake/Output Summary (Last 24 hours) at 06/11/2019 1444 Last data filed at 06/11/2019 1005 Gross per 24 hour  Intake 1336.53 ml  Output 490 ml  Net 846.53 ml   Filed Weights   06/11/2019 0138 06/09/2019 0919 06/11/19 0500  Weight: 56 kg 54.6 kg 53.9 kg    General: Elderly frail male, resting in bed easily arousable HEENT: Membranes dry, tracks appropriately Neuro: Oriented to person, very difficult with hard of hearing CV: Regular rate rhythm, S1-S2 no MRG PULM: Rhonchi bilateral chest, no wheeze no crackles GI: Soft, nontender nondistended bowel sounds present Extremities: Warm dry no edema Skin: Cellulitis left forearm, appears stable.   Resolved Hospital Problem list     Assessment & Plan:   Acute hypoxic respiratory failure, resolving -Likely secondary to pulmonary edema/PAH though chest x-ray not severely worse than prior, last echo 55-60 and stage I diastolic failure and currently elevated pulmonary artery pressure P:  Resolving now on nasal cannula O2 supplementation.  Hypotension, lactic acidosis, possible sepsis -Recent left forearm cellulitis and Pseudomonas UTI -lactic acid 2.1 P: -Continued on previous chronic prednisone dosing -Acute situation appears to have resolved. -All cultures  negative, will discontinue antimicrobials  Paroxysmal atrial fibrillation, hypertension -Recently diagnosed, required cardioversion and started on amiodarone and beta-blocker earlier this month P: -Continue amiodarone, hold beta-blocker -Appreciate cardiology input -Digoxin x1 per cardiology. -Eliquis held, Lovenox full dose  Acute metabolic encephalopathy, secondary to above -This is improved able to follow commands today  History of GCA -On chronic prednisone 20 mg daily P: -Continue stress dose steroids, will need to taper -Dropped prednisone to 40 mg methylprednisolone every 12 hours  Thrombocytopenia, anemia -Platelets 105, seen by hematology last admission and no indication for bone marrow biopsy -Hgb 8.7 initially, on later istat dropped to 6.1 P: Transfused x1 unit Continue to monitor no sign of active bleeding If still concern of bleeding may need to hold evening dose of Lovenox. Already got it this morning.   Best practice:  Diet: N.p.o. Pain/Anxiety/Delirium protocol (if indicated): N/A VAP protocol (if indicated): N/A DVT prophylaxis: Eliquis GI prophylaxis: Protonix Glucose control: SSI Mobility: Bedrest Code Status: Full code Family Communication: Spoke with daughter Disposition: ICU  Labs   CBC: Recent Labs  Lab 06/06/19 0255 06/06/19 0255 06/07/19 0302 06/07/19 0302 06/09/2019 0219 06/03/2019 0520 06/04/2019 0745 06/11/19 0221 06/11/19 0424  WBC 7.2  --  8.3  --  10.5  --  13.9* 8.0  --   NEUTROABS  --   --   --   --  8.9*  --   --   --   --   HGB 9.3*   < > 9.0*   < > 8.7* 6.1* 7.4* 6.8* 6.6*  HCT 27.9*   < > 27.0*   < > 27.4* 18.0* 23.3* 21.5* 20.7*  MCV 101.5*  --  101.5*  --  105.4*  --  106.9* 107.0*  --   PLT 99*  --  110*  --  105*  --  100* 97*  --    < > = values in this interval not displayed.    Basic Metabolic Panel: Recent Labs  Lab 06/05/19 0245 06/05/19 0245 06/06/19 0255 06/07/19 0302 06/09/2019 0219 06/28/2019 0520  06/11/19 0221  NA 141   < > 140 139 144 142 148*  K 4.5   < > 4.4 4.3 4.5 4.0 4.1  CL 105  --  106 105 107  --  113*  CO2 28  --  25 24 24  --  22  GLUCOSE 174*  --  170* 198* 113*  --  127*  BUN 37*  --  38* 40* 65*  --  64*  CREATININE 0.84  --  0.77 0.85 1.20  --  1.18  CALCIUM 8.6*  --  8.7* 8.8* 8.3*  --  8.7*  MG  --   --   --   --   --   --  2.6*  PHOS  --   --   --   --   --   --  5.0*   < > = values in this interval not displayed.   GFR: Estimated Creatinine Clearance: 31.1 mL/min (by C-G formula based on SCr of 1.18 mg/dL). Recent Labs  Lab 06/07/19 0302 06/19/2019 0219 06/26/2019 0745 07/03/2019 0850 06/11/19 0221  PROCALCITON  --   --  0.32  --   --   WBC   8.3 10.5 13.9*  --  8.0  LATICACIDVEN  --  2.1*  --  3.6*  --     Liver Function Tests: Recent Labs  Lab 06/13/2019 0219 06/11/19 0221  AST 60* 45*  ALT 52* 38  ALKPHOS 94 74  BILITOT 1.3* 1.6*  PROT 4.3* 4.9*  ALBUMIN 2.0* 2.9*   No results for input(s): LIPASE, AMYLASE in the last 168 hours. No results for input(s): AMMONIA in the last 168 hours.  ABG    Component Value Date/Time   PHART 7.571 (H) 06/04/2019 0520   PCO2ART 27.4 (L) 06/07/2019 0520   PO2ART 63 (L) 07/03/2019 0520   HCO3 25.3 06/05/2019 0520   TCO2 26 06/13/2019 0520   O2SAT 95.0 06/09/2019 0520     Coagulation Profile: Recent Labs  Lab 06/22/2019 0219  INR 2.2*    Cardiac Enzymes: No results for input(s): CKTOTAL, CKMB, CKMBINDEX, TROPONINI in the last 168 hours.  HbA1C: Hgb A1c MFr Bld  Date/Time Value Ref Range Status  06/04/2019 07:45 AM 6.4 (H) 4.8 - 5.6 % Final    Comment:    (NOTE) Pre diabetes:          5.7%-6.4% Diabetes:              >6.4% Glycemic control for   <7.0% adults with diabetes   04/07/2019 04:52 AM 5.0 4.8 - 5.6 % Final    Comment:    (NOTE) Pre diabetes:          5.7%-6.4% Diabetes:              >6.4% Glycemic control for   <7.0% adults with diabetes     CBG: Recent Labs  Lab  06/04/2019 1957 06/11/19 0005 06/11/19 0410 06/11/19 0740 06/11/19 1140  GLUCAP 144* 128* 115* 110* 137*    Review of Systems:   Unable to obtain secondary to mental status  Past Medical History  He,  has a past medical history of Cancer (HCC) and Hypertension.   Surgical History    Past Surgical History:  Procedure Laterality Date  . APPENDECTOMY    . ARTERY BIOPSY Bilateral 04/07/2019   Procedure: BIOPSY TEMPORAL ARTERY;  Surgeon: Lovick, Ayesha N, MD;  Location: [facility] OR;  Service: General;  Laterality: Bilateral;  . CARDIOVERSION N/A 06/02/2019   Procedure: CARDIOVERSION;  Surgeon: Schumann, Christopher L, MD;  Location: [facility] ENDOSCOPY;  Service: Cardiovascular;  Laterality: N/A;  . MELANOMA EXCISION Bilateral 01/30/2019   Procedure: WIDE LOCAL EXCISION WITH ADVANCEMENT FLAP CLOSURE OF MELANOMA LEFT NECK AND RIGHT BACK;  Surgeon: Byerly, Faera, MD;  Location: [facility] OR;  Service: General;  Laterality: Bilateral;  . TEE WITHOUT CARDIOVERSION N/A 06/02/2019   Procedure: TRANSESOPHAGEAL ECHOCARDIOGRAM (TEE);  Surgeon: Schumann, Christopher L, MD;  Location: [facility] ENDOSCOPY;  Service: Cardiovascular;  Laterality: N/A;     Social History   reports that he has never smoked. He has never used smokeless tobacco. He reports current alcohol use. He reports that he does not use drugs.   Family History   His family history includes Heart attack in his father; Heart failure in his father.   Allergies Allergies  Allergen Reactions  . Tape Other (See Comments)    PATIENT'S SKIN IS THIN AND IT TEARS EASILY     Home Medications  Prior to Admission medications   Medication Sig Start Date End Date Taking? Authorizing Provider  Amino Acids-Protein Hydrolys (FEEDING SUPPLEMENT, PRO-STAT SUGAR FREE 64,) LIQD Take 30 mLs by mouth 2 (two) times daily   with a meal.   Yes [provider]  amiodarone (PACERONE) 400 MG tablet Take 1 tablet (400 mg total) by mouth 2 (two) times daily. 06/07/19  Yes Hosie Poisson, MD  amoxicillin-clavulanate (AUGMENTIN) 875-125 MG tablet Take 1 tablet by mouth every 12 (twelve) hours for 5 days. 06/07/19 06/12/19 Yes Hosie Poisson, MD  apixaban (ELIQUIS) 2.5 MG TABS tablet Take 1 tablet (2.5 mg total) by mouth 2 (two) times daily. 06/07/19  Yes Hosie Poisson, MD  folic acid (FOLVITE) 1 MG tablet Take 1 tablet (1 mg total) by mouth daily. 06/07/19  Yes Hosie Poisson, MD  metoprolol tartrate (LOPRESSOR) 100 MG tablet Take 1 tablet (100 mg total) by mouth 2 (two) times daily. 06/07/19  Yes Hosie Poisson, MD  Multiple Vitamin (MULTIVITAMIN WITH MINERALS) TABS tablet Take 1 tablet by mouth daily.   Yes [provider]  pantoprazole (PROTONIX) 20 MG tablet Take 1 tablet (20 mg total) by mouth daily. 04/11/19  Yes Guilford Shi, MD  predniSONE (DELTASONE) 20 MG tablet Take 20 mg by mouth daily.  05/19/19  Yes [provider]  saccharomyces boulardii (FLORASTOR) 250 MG capsule Take 1 capsule (250 mg total) by mouth 2 (two) times daily. 06/07/19  Yes Hosie Poisson, MD  terazosin (HYTRIN) 1 MG capsule Take 1 mg by mouth at bedtime.   Yes [provider]  thiamine 100 MG tablet Take 1 tablet (100 mg total) by mouth daily. 06/07/19  Yes Hosie Poisson, MD  feeding supplement, ENSURE ENLIVE, (ENSURE ENLIVE) LIQD Take 237 mLs by mouth 3 (three) times daily between meals. Patient not taking: Reported on 06/29/2019 04/10/19   Guilford Shi, MD     This patient is critically ill with multiple organ system failure; which, requires frequent high complexity decision making, assessment, support, evaluation, and titration of therapies. This was completed through the application of advanced monitoring technologies and extensive interpretation of multiple databases. During this encounter critical care time was devoted to patient care services described in this note for 32 minutes.    [facility] 06/11/2019 2:45 PM

## 2019-06-12 DIAGNOSIS — L8990 Pressure ulcer of unspecified site, unspecified stage: Secondary | ICD-10-CM | POA: Insufficient documentation

## 2019-06-12 DIAGNOSIS — I483 Typical atrial flutter: Secondary | ICD-10-CM

## 2019-06-12 LAB — TYPE AND SCREEN
ABO/RH(D): O NEG
Antibody Screen: NEGATIVE
Unit division: 0

## 2019-06-12 LAB — BPAM RBC
Blood Product Expiration Date: 202105242359
ISSUE DATE / TIME: 202105090732
Unit Type and Rh: 9500

## 2019-06-12 LAB — VITAMIN B12: Vitamin B-12: 739 pg/mL (ref 180–914)

## 2019-06-12 LAB — GLUCOSE, CAPILLARY
Glucose-Capillary: 140 mg/dL — ABNORMAL HIGH (ref 70–99)
Glucose-Capillary: 145 mg/dL — ABNORMAL HIGH (ref 70–99)
Glucose-Capillary: 149 mg/dL — ABNORMAL HIGH (ref 70–99)
Glucose-Capillary: 150 mg/dL — ABNORMAL HIGH (ref 70–99)
Glucose-Capillary: 202 mg/dL — ABNORMAL HIGH (ref 70–99)
Glucose-Capillary: 204 mg/dL — ABNORMAL HIGH (ref 70–99)
Glucose-Capillary: 223 mg/dL — ABNORMAL HIGH (ref 70–99)

## 2019-06-12 MED ORDER — OSMOLITE 1.2 CAL PO LIQD
1000.0000 mL | ORAL | Status: DC
  Administered 2019-06-12 – 2019-06-14 (×3): 1000 mL
  Filled 2019-06-12 (×4): qty 1000

## 2019-06-12 MED ORDER — OSMOLITE 1.2 CAL PO LIQD
1000.0000 mL | ORAL | Status: DC
  Filled 2019-06-12: qty 1000

## 2019-06-12 MED ORDER — PREDNISONE 10 MG PO TABS
20.0000 mg | ORAL_TABLET | Freq: Every day | ORAL | Status: DC
  Administered 2019-06-13 – 2019-06-14 (×2): 20 mg
  Filled 2019-06-12 (×2): qty 2

## 2019-06-12 MED ORDER — DIGOXIN 0.25 MG/ML IJ SOLN
0.2500 mg | Freq: Every day | INTRAMUSCULAR | Status: DC
  Administered 2019-06-12: 0.25 mg via INTRAVENOUS
  Filled 2019-06-12 (×2): qty 2

## 2019-06-12 MED ORDER — METOPROLOL TARTRATE 25 MG/10 ML ORAL SUSPENSION
100.0000 mg | Freq: Two times a day (BID) | ORAL | Status: DC
  Administered 2019-06-12 – 2019-06-14 (×4): 100 mg
  Filled 2019-06-12 (×4): qty 40

## 2019-06-12 MED ORDER — INSULIN ASPART 100 UNIT/ML ~~LOC~~ SOLN
1.0000 [IU] | SUBCUTANEOUS | Status: DC
  Administered 2019-06-12 (×2): 3 [IU] via SUBCUTANEOUS
  Administered 2019-06-13 (×3): 2 [IU] via SUBCUTANEOUS
  Administered 2019-06-13: 3 [IU] via SUBCUTANEOUS
  Administered 2019-06-14: 1 [IU] via SUBCUTANEOUS
  Administered 2019-06-14: 2 [IU] via SUBCUTANEOUS
  Administered 2019-06-14: 3 [IU] via SUBCUTANEOUS
  Administered 2019-06-14: 2 [IU] via SUBCUTANEOUS

## 2019-06-12 MED ORDER — DILTIAZEM HCL-DEXTROSE 125-5 MG/125ML-% IV SOLN (PREMIX)
5.0000 mg/h | INTRAVENOUS | Status: DC
  Administered 2019-06-12: 5 mg/h via INTRAVENOUS
  Filled 2019-06-12: qty 125

## 2019-06-12 NOTE — Progress Notes (Signed)
Initial Nutrition Assessment  DOCUMENTATION CODES:   Severe malnutrition in context of chronic illness, Underweight  INTERVENTION:    Osmolite 1.2 at 20 ml/h, increase by 10 ml every 8 hours to goal rate of 60 ml/h (1440 ml per day)   Provides 1728 kcal, 80 gm protein, 1181 ml free water daily  NUTRITION DIAGNOSIS:   Severe Malnutrition related to chronic illness(HF) as evidenced by severe muscle depletion, severe fat depletion, percent weight loss(9% weight loss within 3 months).  GOAL:   Patient will meet greater than or equal to 90% of their needs  MONITOR:   TF tolerance, Skin, Diet advancement, PO intake, Labs  REASON FOR ASSESSMENT:   Consult Enteral/tube feeding initiation and management  ASSESSMENT:   86 yo male admitted with dyspnea. PMH includes PAF, HF, giant cell arteritis on chronic prednisone.   Required BiPAP on admission, currently on 5 L nasal canula.  S/P MBS with SLP 5/9. He is being allowed snacks of dysphagia 1 with nectar thick liquids. SLP to re-evaluate today.   Cortrak was placed today (gastric); received MD Consult for TF initiation and management.  Patient did not wake up when name called or during nutrition focused physical exam. Patient has severe depletion of muscle and subcutaneous fat mass, meeting criteria for severe protein calorie malnutrition.   Labs from 5/9 reviewed. Na 148 (H), phos 5 (H), mag 2.6 (H) CBG's: 150-145-140  Medications reviewed and include prednisone.  Patient has had 9% weight loss within the past 2 months, significant for the time frame.  NUTRITION - FOCUSED PHYSICAL EXAM:    Most Recent Value  Orbital Region  Severe depletion  Upper Arm Region  Severe depletion  Thoracic and Lumbar Region  Severe depletion  Buccal Region  Severe depletion  Temple Region  Severe depletion  Clavicle Bone Region  Severe depletion  Clavicle and Acromion Bone Region  Severe depletion  Scapular Bone Region  Severe depletion   Dorsal Hand  Severe depletion  Patellar Region  Severe depletion  Anterior Thigh Region  Severe depletion  Posterior Calf Region  Severe depletion  Edema (RD Assessment)  Severe  Hair  Reviewed  Eyes  Unable to assess  Mouth  Reviewed  Skin  Reviewed  Nails  Reviewed       Diet Order:   Diet Order            Diet NPO time specified  Diet effective now              EDUCATION NEEDS:   Not appropriate for education at this time  Skin:  Skin Assessment: Skin Integrity Issues: Skin Integrity Issues:: Stage I Stage I: sacrum  Last BM:  5/9 type 5  Height:   Ht Readings from Last 1 Encounters:  06/06/2019 5\' 8"  (1.727 m)    Weight:   Wt Readings from Last 1 Encounters:  06/12/19 53.1 kg    Ideal Body Weight:  70 kg  BMI:  Body mass index is 17.8 kg/m.  Estimated Nutritional Needs:   Kcal:  1600-1800  Protein:  80-90 gm  Fluid:  1.6-1.8 L    Lucas Mallow, RD, LDN, CNSC Please refer to Amion for contact information.

## 2019-06-12 NOTE — Progress Notes (Signed)
NAME:  Matthew Hickman, MRN:  [id number], DOB:  09-27-1925, LOS: 2 ADMISSION DATE:  06/26/2019, CONSULTATION DATE:  06/12/19 REFERRING MD:  EDP, CHIEF COMPLAINT:  Dyspnea   Brief History   86 year old male with a history of paroxysmal atrial fibrillation, HFpEF, giant cell arteritis on chronic prednisone who was brought in from SNF for shortness of breath.  Possible pulmonary edema versus sepsis, required BiPAP in the ED and blood pressure borderline, so PCCM consulted for admission  History of present illness   Matthew Hickman is a 86 year old male with past medical history of paroxysmal atrial fibrillation, hypertension, HFpEF, and giant cell arteritis on chronic prednisone who presents from SNF for dyspnea.  Was recently admitted and diagnosed with atrial fibrillation requiring 10 and initiated on amiodarone, Lopressor and Eliquis and discharged on 06/07/19.  He was also treated for left forearm cellulitis and Pseudomonas UTI, discharged on Cipro and Augmentin.  Per ED report, patient was feeling short of breath all day and was on 4 L nonrebreather mask on EMS arrival.  Hypotensive with SBP in the 80s, given approximately 500 cc IV fluids.  Systolic improved [id number] at the time of admission.  Chest x-ray showed likely pulmonary edema, minimally changed from prior film.  Lactic acid 2.1, no leukocytosis, very mildly elevated LFTs, urinalysis and respiratory viral panel negative.  EKG showed atrial fibrillation, rate 120s.  ABG 7.57/27 point 4/63/20 5.3. Patient  was awake and alert at the time of admission, he was confused, but yelling and protecting his airway well and tolerating BiPAP.  He was given vancomycin, cefepime, Flagyl, glucagon to reverse beta-blocker, and Lasix 40 mg and PCCM consulted for admission   Past Medical History   has a past medical history of Cancer ([place]) and Hypertension.  Atrial fibrillation, GCA, HFpEF  Significant Hospital Events   06/12/19 Admit to PCCM  Consults:     Procedures:    Significant Diagnostic Tests:  06/12/19 CXR>>Progressive bibasilar airspace disease consistent with edema.  Micro Data:  06/12/19 RVP>>negative 06/12/19 BCx2>> 06/12/19 UC>>  Antimicrobials:  Cefepime 5/8- Vancomycin 5/8- Flagyl 5/8  Interim history/subjective:   Transfused for low hemoglobin.  No signs of bleeding.  Failed swallow evaluation.  Started on diltiazem IV for rapid atrial fibrillation.  Objective   Blood pressure 134/73, pulse (!) 106, temperature 98 F (36.7 C), temperature source Axillary, resp. rate 15, height 5\' 8"  (1.727 m), weight 53.1 kg, SpO2 98 %.        Intake/Output Summary (Last 24 hours) at 06/12/2019 1356 Last data filed at 06/12/2019 0600 Gross per 24 hour  Intake 40 ml  Output 790 ml  Net -750 ml   Filed Weights   06/27/2019 0919 06/11/19 0500 06/12/19 0500  Weight: 54.6 kg 53.9 kg 53.1 kg    General: Elderly frail male, resting in bed fully conversant but hard of hearing.  Marked cachexia HEENT: Membranes dry, tracks appropriately Neuro: Oriented to person, very difficult with hard of hearing CV: Regular rate rhythm, S1-S2 no MRG PULM: Chest clear. GI: Soft, nontender nondistended bowel sounds present Extremities: Warm dry no edema Skin: Cellulitis left forearm, appears stable.   Resolved Hospital Problem list     Assessment & Plan:   Acute hypoxic respiratory failure, resolving Likely due to combination of aspiration and pulmonary edema -Resolving now on nasal cannula O2 supplementation.  Paroxysmal atrial fibrillation, hypertension Recently diagnosed, required cardioversion and started on amiodarone and beta-blocker earlier this month -Place core track tube to start oral medication  Chronic aspiration based on swallowing evaluation This may explain plan his cachexia and his pulmonary status. -Small bore feeding tube for now -We will need to have larger discussion regarding goals of care and advisability  of PEG tube feeding in chronically debilitated elderly man.  History of GCA -Resume home steroid dose  Thrombocytopenia, anemia Suspect marrow failure and/or myelodysplastic syndrome -Smear review. -No specific treatment indicated given age and poor performance status.   Best practice:  Diet: N.p.o. Pain/Anxiety/Delirium protocol (if indicated): N/A VAP protocol (if indicated): N/A DVT prophylaxis: Eliquis GI prophylaxis: Protonix Glucose control: SSI Mobility: Bedrest Code Status: Full code Family Communication: Spoke with daughter Disposition: ICU  Labs   CBC: Recent Labs  Lab 06/07/19 0302 06/07/19 0302 06/16/2019 0219 06/13/2019 0219 06/25/2019 0520 06/05/2019 0745 06/11/19 0221 06/11/19 0424 06/11/19 1528  WBC 8.3  --  10.5  --   --  13.9* 8.0  --  10.3  NEUTROABS  --   --  8.9*  --   --   --   --   --   --   HGB 9.0*   < > 8.7*   < > 6.1* 7.4* 6.8* 6.6* 9.0*  HCT 27.0*   < > 27.4*   < > 18.0* 23.3* 21.5* 20.7* 28.2*  MCV 101.5*  --  105.4*  --   --  106.9* 107.0*  --  101.1*  PLT 110*  --  105*  --   --  100* 97*  --  96*   < > = values in this interval not displayed.    Basic Metabolic Panel: Recent Labs  Lab 06/06/19 0255 06/07/19 0302 06/15/2019 0219 06/16/2019 0520 06/11/19 0221  NA 140 139 144 142 148*  K 4.4 4.3 4.5 4.0 4.1  CL 106 105 107  --  113*  CO2 25 24 24   --  22  GLUCOSE 170* 198* 113*  --  127*  BUN 38* 40* 65*  --  64*  CREATININE 0.77 0.85 1.20  --  1.18  CALCIUM 8.7* 8.8* 8.3*  --  8.7*  MG  --   --   --   --  2.6*  PHOS  --   --   --   --  5.0*   GFR: Estimated Creatinine Clearance: 30.6 mL/min (by C-G formula based on SCr of 1.18 mg/dL). Recent Labs  Lab 06/13/2019 0219 06/09/2019 0745 06/17/2019 0850 06/11/19 0221 06/11/19 1528  PROCALCITON  --  0.32  --   --   --   WBC 10.5 13.9*  --  8.0 10.3  LATICACIDVEN 2.1*  --  3.6*  --   --     Liver Function Tests: Recent Labs  Lab 06/11/2019 0219 06/11/19 0221  AST 60* 45*  ALT 52*  38  ALKPHOS 94 74  BILITOT 1.3* 1.6*  PROT 4.3* 4.9*  ALBUMIN 2.0* 2.9*   No results for input(s): LIPASE, AMYLASE in the last 168 hours. No results for input(s): AMMONIA in the last 168 hours.  ABG    Component Value Date/Time   PHART 7.571 (H) 06/25/2019 0520   PCO2ART 27.4 (L) 06/15/2019 0520   PO2ART 63 (L) 06/04/2019 0520   HCO3 25.3 06/19/2019 0520   TCO2 26 06/07/2019 0520   O2SAT 95.0 06/19/2019 0520     Coagulation Profile: Recent Labs  Lab 06/06/2019 0219  INR 2.2*    Cardiac Enzymes: No results for input(s): CKTOTAL, CKMB, CKMBINDEX, TROPONINI in the last 168 hours.  HbA1C: Hgb  A1c MFr Bld  Date/Time Value Ref Range Status  06/27/2019 07:45 AM 6.4 (H) 4.8 - 5.6 % Final    Comment:    (NOTE) Pre diabetes:          5.7%-6.4% Diabetes:              >6.4% Glycemic control for   <7.0% adults with diabetes   04/07/2019 04:52 AM 5.0 4.8 - 5.6 % Final    Comment:    (NOTE) Pre diabetes:          5.7%-6.4% Diabetes:              >6.4% Glycemic control for   <7.0% adults with diabetes     CBG: Recent Labs  Lab 06/11/19 1958 06/12/19 0005 06/12/19 0426 06/12/19 0815 06/12/19 1135  GLUCAP 150* 149* 150* [place], MD [facility] ICU Physician [place]  Pager: [phone] Mobile: [phone] After hours: [phone].  06/12/2019, 2:00 PM     .  06/12/2019, 2:00 PM    06/12/2019 1:56 PM

## 2019-06-12 NOTE — Procedures (Signed)
Cortrak  Person Inserting Tube:  Pricella Gaugh E, RD Tube Type:  Cortrak - 43 inches Tube Location:  Left nare Initial Placement:  Stomach Secured by: Bridle Technique Used to Measure Tube Placement:  Documented cm marking at nare/ corner of mouth Cortrak Secured At:  74 cm    Cortrak Tube Team Note:  Consult received to place a Cortrak feeding tube.   No x-ray is required. RN may begin using tube.   If the tube becomes dislodged please keep the tube and contact the Cortrak team at [email or web address] ([email or web address]) for replacement.  If after hours and replacement cannot be delayed, place a NG tube and confirm placement with an abdominal x-ray.    Kazmir Oki, MS, RD, LDN RD pager number and weekend/on-call pager number located in Amion.  

## 2019-06-12 NOTE — Progress Notes (Signed)
Progress Note  Patient Name: Matthew Hickman Date of Encounter: 06/12/2019  Primary Cardiologist: Sherren Mocha, MD   Subjective   No angina .  Inpatient Medications    Scheduled Meds: . chlorhexidine  15 mL Mouth Rinse BID  . Chlorhexidine Gluconate Cloth  6 each Topical Daily  . enoxaparin (LOVENOX) injection  50 mg Subcutaneous Q12H  . mouth rinse  15 mL Mouth Rinse q12n4p  . methylPREDNISolone (SOLU-MEDROL) injection  40 mg Intravenous Q12H  . pantoprazole (PROTONIX) IV  40 mg Intravenous Q24H   Continuous Infusions: . sodium chloride Stopped (06/11/19 0807)   PRN Meds: sodium chloride, albuterol, docusate sodium, polyethylene glycol, Resource ThickenUp Clear   Vital Signs    Vitals:   06/12/19 0400 06/12/19 0428 06/12/19 0500 06/12/19 0600  BP: 133/83  119/68 134/73  Pulse:   (!) 106   Resp: (!) 21  19 15   Temp:  (!) 97.4 F (36.3 C)    TempSrc:  Oral    SpO2: 99%  98% 98%  Weight:   53.1 kg   Height:        Intake/Output Summary (Last 24 hours) at 06/12/2019 0740 Last data filed at 06/12/2019 0600 Gross per 24 hour  Intake 376.26 ml  Output 790 ml  Net -413.74 ml   Filed Weights   06/30/2019 0919 06/11/19 0500 06/12/19 0500  Weight: 54.6 kg 53.9 kg 53.1 kg    Telemetry    Atrial fib/flutter with a RVR - Personally Reviewed  ECG    none - Personally Reviewed  Physical Exam   Frail elderly mail  HEENT: normal Neck supple with no adenopathy JVP normal no bruits no thyromegaly Lungs clear with no wheezing and good diaphragmatic motion Heart:  S1/S2 no murmur, no rub, gallop or click PMI normal Abdomen: benighn, BS positve, no tenderness, no AAA no bruit.  No HSM or HJR Distal pulses intact with no bruits No edema Neuro non-focal Skin warm and dry No muscular weakness   Labs    Chemistry Recent Labs  Lab 06/07/19 0302 06/07/19 0302 06/13/2019 0219 07/01/2019 0520 06/11/19 0221  NA 139   < > 144 142 148*  K 4.3   < > 4.5 4.0 4.1    CL 105  --  107  --  113*  CO2 24  --  24  --  22  GLUCOSE 198*  --  113*  --  127*  BUN 40*  --  65*  --  64*  CREATININE 0.85  --  1.20  --  1.18  CALCIUM 8.8*  --  8.3*  --  8.7*  PROT  --   --  4.3*  --  4.9*  ALBUMIN  --   --  2.0*  --  2.9*  AST  --   --  60*  --  45*  ALT  --   --  52*  --  38  ALKPHOS  --   --  94  --  74  BILITOT  --   --  1.3*  --  1.6*  GFRNONAA >60  --  53*  --  54*  GFRAA >60  --  >60  --  >60  ANIONGAP 10  --  13  --  13   < > = values in this interval not displayed.     Hematology Recent Labs  Lab 06/11/2019 0745 06/23/2019 0745 06/11/19 0221 06/11/19 0424 06/11/19 1528  WBC 13.9*  --  8.0  --  10.3  RBC 2.18*  --  2.01*  --  2.79*  HGB 7.4*   < > 6.8* 6.6* 9.0*  HCT 23.3*   < > 21.5* 20.7* 28.2*  MCV 106.9*  --  107.0*  --  101.1*  MCH 33.9  --  33.8  --  32.3  MCHC 31.8  --  31.6  --  31.9  RDW 21.4*  --  21.3*  --  23.7*  PLT 100*  --  97*  --  96*   < > = values in this interval not displayed.    Cardiac EnzymesNo results for input(s): TROPONINI in the last 168 hours. No results for input(s): TROPIPOC in the last 168 hours.   BNP Recent Labs  Lab 06/24/2019 0745  BNP 127.0*     DDimer No results for input(s): DDIMER in the last 168 hours.   Radiology    DG Swallowing Func-Speech Pathology  Result Date: 06/11/2019 Objective Swallowing Evaluation: Type of Study: MBS-Modified Barium Swallow Study  Patient Details Name: Matthew Hickman MRN: [id number] Date of Birth: 02-Nov-1925 Today's Date: 06/11/2019 Time: SLP Start Time (ACUTE ONLY): 1206 -SLP Stop Time (ACUTE ONLY): 1235 SLP Time Calculation (min) (ACUTE ONLY): 29 min Past Medical History: Past Medical History: Diagnosis Date . Cancer ([place])   skin . Hypertension  Past Surgical History: Past Surgical History: Procedure Laterality Date . APPENDECTOMY   . ARTERY BIOPSY Bilateral 04/07/2019  Procedure: BIOPSY TEMPORAL ARTERY;  Surgeon: Jesusita Oka, MD;  Location: [place];  Service: General;   Laterality: Bilateral; . CARDIOVERSION N/A 06/02/2019  Procedure: CARDIOVERSION;  Surgeon: Donato Heinz, MD;  Location: [place];  Service: Cardiovascular;  Laterality: N/A; . MELANOMA EXCISION Bilateral 01/30/2019  Procedure: WIDE LOCAL EXCISION WITH ADVANCEMENT FLAP CLOSURE OF MELANOMA LEFT NECK AND RIGHT BACK;  Surgeon: [name], MD;  Location: [place];  Service: General;  Laterality: Bilateral; . TEE WITHOUT CARDIOVERSION N/A 06/02/2019  Procedure: TRANSESOPHAGEAL ECHOCARDIOGRAM (TEE);  Surgeon: Donato Heinz, MD;  Location[facility] ENDOSCOPY;  Service: Cardiovascular;  Laterality: N/A; HPI: Matthew Hickman is a 86 y.o. male with a hx of HTN, skin cancer and giant cell arteritis, a fib with recent discharge 06/07/19  who is being seen today for the evaluation of Acute CHF.  CXR reported: "Progressive bibasilar airspace disease consistent with edema."  Subjective: Pt was cooperative Assessment / Plan / Recommendation CHL IP CLINICAL IMPRESSIONS 06/11/2019 Clinical Impression Pt was seen for a modified barium swallow study and he presents with moderate oropharyngeal dysphagia with resultant aspiration of thin liquid via straw sip on today's examination.  Laryngeal penetration occurred before the swallow and it resulted in aspiration during/after the swallow secondary to delayed swallow initiation/laryngeal closure.  Aspiration was sensed, but pt was unable to clear aspirated material despite spontaneous and cued coughing.  Laryngeal penetration/aspiration was not observed with nectar-thick liquid, puree, or thin liquid via tsp; however moderate-severe vallecular residue, mild pyriform sinus residue, and trace posterior pharyngeal wall residue was noted across most PO trials.  Pt was unable to fully clear pharyngeal residue despite multiple swallows which places him at an increased risk for aspiration with all PO intake.  Pharyngeal phase was remarkable for reduced BOT retraction, reduced  hyolaryngeal excursion, and reduced pharyngeal constriction, resulting in the beforementioned pharyngeal residue.  Oral phase was remarkable for reduced lingual control resulting in premature spillage to the pharynx and reduced lingual strength resulting in trace-moderate oral residue.  Pharyngoesophageal phase was remarkable for esophageal backflow  into the pyriform sinus and suspected reduced duration of UES opening with questionable CP bar (radiologist was not present to confirm).  Recommend that pt begin snacks of Dysphagia 1 (puree) solids and nectar-thick liquids from the floor stock with full RN supervision and strict adherence to compensatory strategies.  Pt may have a few small spoon sips of water PRN following thorough oral care given full RN supervision.  If pt/family wish to pursue a liberalized diet with comfort measures, would recommend Dysphagia 1 (puree) solids and thin liquids.    SLP Visit Diagnosis Dysphagia, oropharyngeal phase (R13.12);Dysphagia, pharyngoesophageal phase (R13.14) Attention and concentration deficit following -- Frontal lobe and executive function deficit following -- Impact on safety and function Moderate aspiration risk;Severe aspiration risk   CHL IP TREATMENT RECOMMENDATION 06/11/2019 Treatment Recommendations Therapy as outlined in treatment plan below   Prognosis 06/11/2019 Prognosis for Safe Diet Advancement Guarded Barriers to Reach Goals Severity of deficits Barriers/Prognosis Comment -- CHL IP DIET RECOMMENDATION 06/11/2019 SLP Diet Recommendations (No Data) Liquid Administration via Spoon;Straw Medication Administration Via alternative means Compensations Small sips/bites;Slow rate;Minimize environmental distractions;Multiple dry swallows after each bite/sip;Effortful swallow Postural Changes Remain semi-upright after after feeds/meals (Comment);Seated upright at 90 degrees   CHL IP OTHER RECOMMENDATIONS 06/11/2019 Recommended Consults Consider GI evaluation Oral Care  Recommendations Oral care QID;Staff/trained caregiver to provide oral care;Oral care before and after PO Other Recommendations Have oral suction available;Remove water pitcher;Prohibited food (jello, ice cream, thin soups)   CHL IP FOLLOW UP RECOMMENDATIONS 06/11/2019 Follow up Recommendations Skilled Nursing facility;24 hour supervision/assistance   CHL IP FREQUENCY AND DURATION 06/11/2019 Speech Therapy Frequency (ACUTE ONLY) min 2x/week Treatment Duration 2 weeks      CHL IP ORAL PHASE 06/11/2019 Oral Phase Impaired Oral - Pudding Teaspoon -- Oral - Pudding Cup -- Oral - Honey Teaspoon -- Oral - Honey Cup -- Oral - Nectar Teaspoon -- Oral - Nectar Cup -- Oral - Nectar Straw Premature spillage;Piecemeal swallowing;Lingual/palatal residue Oral - Thin Teaspoon Lingual/palatal residue Oral - Thin Cup -- Oral - Thin Straw Premature spillage;Piecemeal swallowing;Lingual/palatal residue Oral - Puree Lingual/palatal residue;Piecemeal swallowing Oral - Mech Soft Lingual/palatal residue;Piecemeal swallowing;Impaired mastication Oral - Regular -- Oral - Multi-Consistency -- Oral - Pill -- Oral Phase - Comment --  CHL IP PHARYNGEAL PHASE 06/11/2019 Pharyngeal Phase Impaired Pharyngeal- Pudding Teaspoon -- Pharyngeal -- Pharyngeal- Pudding Cup -- Pharyngeal -- Pharyngeal- Honey Teaspoon -- Pharyngeal -- Pharyngeal- Honey Cup -- Pharyngeal -- Pharyngeal- Nectar Teaspoon -- Pharyngeal -- Pharyngeal- Nectar Cup -- Pharyngeal -- Pharyngeal- Nectar Straw Delayed swallow initiation-vallecula;Pharyngeal residue - valleculae;Pharyngeal residue - pyriform;Reduced pharyngeal peristalsis;Reduced anterior laryngeal mobility;Reduced tongue base retraction Pharyngeal Material does not enter airway Pharyngeal- Thin Teaspoon Delayed swallow initiation-vallecula;Pharyngeal residue - valleculae;Pharyngeal residue - pyriform;Reduced anterior laryngeal mobility;Reduced tongue base retraction Pharyngeal Material does not enter airway Pharyngeal- Thin Cup  -- Pharyngeal -- Pharyngeal- Thin Straw Delayed swallow initiation-pyriform sinuses;Reduced pharyngeal peristalsis;Reduced anterior laryngeal mobility;Reduced airway/laryngeal closure;Reduced tongue base retraction;Penetration/Aspiration before swallow;Penetration/Aspiration during swallow;Penetration/Apiration after swallow;Pharyngeal residue - pyriform;Moderate aspiration;Trace aspiration;Pharyngeal residue - posterior pharnyx Pharyngeal Material enters airway, passes BELOW cords and not ejected out despite cough attempt by patient Pharyngeal- Puree Delayed swallow initiation-vallecula;Pharyngeal residue - valleculae;Pharyngeal residue - pyriform;Pharyngeal residue - posterior pharnyx;Reduced pharyngeal peristalsis;Reduced anterior laryngeal mobility;Reduced tongue base retraction Pharyngeal Material does not enter airway Pharyngeal- Mechanical Soft Other (Comment) Pharyngeal -- Pharyngeal- Regular -- Pharyngeal -- Pharyngeal- Multi-consistency -- Pharyngeal -- Pharyngeal- Pill -- Pharyngeal -- Pharyngeal Comment --  CHL IP CERVICAL ESOPHAGEAL PHASE 06/11/2019 Cervical Esophageal Phase Impaired  Pudding Teaspoon -- Pudding Cup -- Honey Teaspoon -- Honey Cup -- Nectar Teaspoon -- Nectar Cup -- Nectar Straw Esophageal backflow into the pharynx;Prominent cricopharyngeal segment Thin Teaspoon -- Thin Cup -- Thin Straw -- Puree -- Mechanical Soft -- Regular -- Multi-consistency -- Pill -- Cervical Esophageal Comment -- Colin Mulders M.S., CCC-SLP Acute Rehabilitation Services Office: [phone] [place] 06/11/2019, 2:30 PM               Cardiac Studies   TEE:  06/02/19  EF 55-60% mild MR   Patient Profile     86 y.o. male admitted with acute CHF and atrial fib/flutter with a RVR  Assessment & Plan    1. Atrial fib/LA flutter - failed swallow test not taking PO will write for daily iv digoxin and low dose iv cardizem  2. Acute on chronic diastolic heart failure - appears euvolemic   Signed, Jenkins Rouge,  MD  06/12/2019, 7:40 AM

## 2019-06-12 NOTE — Progress Notes (Signed)
  Speech Language Pathology Treatment: Dysphagia  Patient Details Name: Matthew Hickman MRN: [id number] DOB: [birth year] Today's Date: 06/12/2019 Time: [id number] SLP Time Calculation (min) (ACUTE ONLY): 19 min  Assessment / Plan / Recommendation Clinical Impression  Pt frail, deconditioned, alert with son at bedside. Seen with puree and nectar thick liquids to determine if safe to initiate meal trays 3 x a day. Oral care provided revealing lingual candidias that son states is not new. SLP is not recommending consistent po intake given + s/s aspiration with nectar and puree but allowing pt to have puree/nectar thick liquids PRN. Throat clearing frequently prior to po's which continued and increased following trials. Multiple swallows with known pharyngeal residue seen during yesterday's MBS. Per notes in chart, daughter, [name] is pt's POA- Palliative care is following and note states Melissa states they would like to focus on comfort if Mr. Aldinger does not have meaningful recovery in 24-48 hours. Recommend discussion with Melissa re: allowing comfort feeds understanding and accepting aspiration risk. Per previous recommend    HPI HPI: Matthew Hickman is a 86 y.o. male with a hx of HTN, skin cancer and giant cell arteritis, a fib with recent discharge 06/07/19  who is being seen today for the evaluation of Acute CHF.  CXR reported: "Progressive bibasilar airspace disease consistent with edema."       SLP Plan  Continue with current plan of care       Recommendations  Diet recommendations: Other(comment);Nectar-thick liquid(floor stock puree, tsp thin water only) Liquids provided via: Teaspoon Medication Administration: Via alternative means                Oral Care Recommendations: Oral care QID;Oral care prior to ice chip/H20;Staff/trained caregiver to provide oral care Follow up Recommendations: Skilled Nursing facility SLP Visit Diagnosis: Dysphagia, unspecified (R13.10) Plan: Continue  with current plan of care       GO                Houston Siren 06/12/2019, 4:16 PM   Orbie Pyo Colvin Caroli.Ed Risk analyst [phone] Office [phone]

## 2019-06-13 ENCOUNTER — Inpatient Hospital Stay (HOSPITAL_COMMUNITY): Payer: Medicare Other

## 2019-06-13 DIAGNOSIS — J810 Acute pulmonary edema: Secondary | ICD-10-CM

## 2019-06-13 DIAGNOSIS — J9601 Acute respiratory failure with hypoxia: Secondary | ICD-10-CM

## 2019-06-13 DIAGNOSIS — I480 Paroxysmal atrial fibrillation: Secondary | ICD-10-CM

## 2019-06-13 DIAGNOSIS — I4891 Unspecified atrial fibrillation: Secondary | ICD-10-CM

## 2019-06-13 DIAGNOSIS — Z66 Do not resuscitate: Secondary | ICD-10-CM

## 2019-06-13 LAB — GLUCOSE, CAPILLARY
Glucose-Capillary: 112 mg/dL — ABNORMAL HIGH (ref 70–99)
Glucose-Capillary: 192 mg/dL — ABNORMAL HIGH (ref 70–99)
Glucose-Capillary: 189 mg/dL — ABNORMAL HIGH (ref 70–99)
Glucose-Capillary: 213 mg/dL — ABNORMAL HIGH (ref 70–99)
Glucose-Capillary: 189 mg/dL — ABNORMAL HIGH (ref 70–99)

## 2019-06-13 LAB — TSH: TSH: 2.04 u[IU]/mL (ref 0.350–4.500)

## 2019-06-13 LAB — BASIC METABOLIC PANEL
Anion gap: 17 — ABNORMAL HIGH (ref 5–15)
BUN: 59 mg/dL — ABNORMAL HIGH (ref 8–23)
CO2: 21 mmol/L — ABNORMAL LOW (ref 22–32)
Calcium: 9.4 mg/dL (ref 8.9–10.3)
Chloride: 120 mmol/L — ABNORMAL HIGH (ref 98–111)
Creatinine, Ser: 1.32 mg/dL — ABNORMAL HIGH (ref 0.61–1.24)
GFR calc Af Amer: 54 mL/min — ABNORMAL LOW (ref >60)
GFR calc non Af Amer: 47 mL/min — ABNORMAL LOW (ref >60)
Glucose, Bld: 234 mg/dL — ABNORMAL HIGH (ref 70–99)
Potassium: 4 mmol/L (ref 3.5–5.1)
Sodium: 158 mmol/L — ABNORMAL HIGH (ref 135–145)

## 2019-06-13 LAB — CBC WITH DIFFERENTIAL/PLATELET
Abs Immature Granulocytes: 0.74 10*3/uL — ABNORMAL HIGH (ref 0.00–0.07)
Basophils Absolute: 0 10*3/uL (ref 0.0–0.1)
Basophils Relative: 0 %
Eosinophils Absolute: 0 10*3/uL (ref 0.0–0.5)
Eosinophils Relative: 0 %
HCT: 33.1 % — ABNORMAL LOW (ref 39.0–52.0)
Hemoglobin: 10.2 g/dL — ABNORMAL LOW (ref 13.0–17.0)
Immature Granulocytes: 6 %
Lymphocytes Relative: 2 %
Lymphs Abs: 0.3 10*3/uL — ABNORMAL LOW (ref 0.7–4.0)
MCH: 32.4 pg (ref 26.0–34.0)
MCHC: 30.8 g/dL (ref 30.0–36.0)
MCV: 105.1 fL — ABNORMAL HIGH (ref 80.0–100.0)
Monocytes Absolute: 0.3 10*3/uL (ref 0.1–1.0)
Monocytes Relative: 3 %
Neutro Abs: 12 10*3/uL — ABNORMAL HIGH (ref 1.7–7.7)
Neutrophils Relative %: 89 %
Platelets: 138 10*3/uL — ABNORMAL LOW (ref 150–400)
RBC: 3.15 MIL/uL — ABNORMAL LOW (ref 4.22–5.81)
RDW: 22.5 % — ABNORMAL HIGH (ref 11.5–15.5)
WBC: 13.4 10*3/uL — ABNORMAL HIGH (ref 4.0–10.5)
nRBC: 0.4 % — ABNORMAL HIGH (ref 0.0–0.2)

## 2019-06-13 LAB — FOLATE RBC
Folate, Hemolysate: 554 ng/mL
Folate, RBC: 1979 ng/mL (ref >498)
Hematocrit: 28 % — ABNORMAL LOW (ref 37.5–51.0)

## 2019-06-13 LAB — PATHOLOGIST SMEAR REVIEW

## 2019-06-13 MED ORDER — FREE WATER
200.0000 mL | Freq: Four times a day (QID) | Status: DC
  Administered 2019-06-13 – 2019-06-14 (×3): 200 mL

## 2019-06-13 MED ORDER — APIXABAN 2.5 MG PO TABS
2.5000 mg | ORAL_TABLET | Freq: Two times a day (BID) | ORAL | Status: DC
  Filled 2019-06-13: qty 1

## 2019-06-13 MED ORDER — MORPHINE SULFATE (PF) 2 MG/ML IV SOLN
INTRAVENOUS | Status: AC
  Administered 2019-06-13: 2 mg via INTRAVENOUS
  Filled 2019-06-13: qty 1

## 2019-06-13 MED ORDER — ENOXAPARIN SODIUM 60 MG/0.6ML ~~LOC~~ SOLN: 50.0000 mg | SUBCUTANEOUS | Status: DC

## 2019-06-13 MED ORDER — DIGOXIN 0.05 MG/ML PO SOLN
0.1250 mg | Freq: Every day | ORAL | Status: DC
  Administered 2019-06-13 – 2019-06-14 (×2): 0.125 mg
  Filled 2019-06-13 (×4): qty 2.5

## 2019-06-13 MED ORDER — FUROSEMIDE 10 MG/ML IJ SOLN
40.0000 mg | Freq: Four times a day (QID) | INTRAMUSCULAR | Status: AC
  Administered 2019-06-13 (×2): 40 mg via INTRAVENOUS
  Filled 2019-06-13 (×2): qty 4

## 2019-06-13 MED ORDER — MORPHINE SULFATE (PF) 2 MG/ML IV SOLN: 2.0000 mg | Freq: Once | INTRAVENOUS | Status: AC

## 2019-06-13 MED ORDER — APIXABAN 2.5 MG PO TABS
2.5000 mg | ORAL_TABLET | Freq: Two times a day (BID) | ORAL | Status: DC
  Administered 2019-06-13 – 2019-06-14 (×2): 2.5 mg
  Filled 2019-06-13 (×3): qty 1

## 2019-06-13 MED ORDER — MORPHINE SULFATE (PF) 2 MG/ML IV SOLN
2.0000 mg | INTRAVENOUS | Status: DC | PRN
  Administered 2019-06-13 – 2019-06-14 (×6): 2 mg via INTRAVENOUS
  Filled 2019-06-13 (×6): qty 1

## 2019-06-13 MED ORDER — FREE WATER: 200.0000 mL | Freq: Four times a day (QID) | Status: DC

## 2019-06-13 NOTE — Progress Notes (Signed)
[facility]  86 y/o male with significant past medical history admitted in setting of atrial fibrillation with RVR and possible sepsis.  At baseline he has HFpEF, paroxysmal atrial fib, chronic steroid use for giant cell arteritis.  He lives in a nursing home.  His oxygen requirement has increased in the last 24 hours and he now requires a NRB mask.  His heart rate has been variable in the last 24 hours.  Chest is clear, has edema in ankles/legs.  Belly is not distended.  He is awake, alert, following commands.  Yesterday he was able to indicate that he consented to a cor-track tube, but remains DNR.  We are awaiting morning labs so far.  Past Medical History:  Diagnosis Date  . Cancer ([place])    skin  . Hypertension     On exam:  Vitals:   06/13/19 1000 06/13/19 1100 06/13/19 1113 06/13/19 1123  BP: (!) 102/55 118/65 118/65   Pulse: (!) 125 88 (!) 117   Resp: (!) 28 (!) 33    Temp:    (!) 96.8 F (36 C)  TempSrc:    Axillary  SpO2: 95% 100%    Weight:      Height:        Intake/Output Summary (Last 24 hours) at 06/13/2019 1340 Last data filed at 06/13/2019 1100 Gross per 24 hour  Intake 804.76 ml  Output 765 ml  Net 39.76 ml   General:  Elderly, cachectic male with increased work of breathing in bed HENT: NCAT OP clear PULM: Crackles bases B, normal effort CV Tachy, irregular, no mgr GI: BS+, soft, nontender MSK: normal bulk and tone Neuro: awake, alert, no distress, MAEW   CBC    Component Value Date/Time   WBC 10.3 06/11/2019 1528   RBC 2.79 (L) 06/11/2019 1528   HGB 9.0 (L) 06/11/2019 1528   HCT 28.2 (L) 06/11/2019 1528   PLT 96 (L) 06/11/2019 1528   MCV 101.1 (H) 06/11/2019 1528   MCH 32.3 06/11/2019 1528   MCHC 31.9 06/11/2019 1528   RDW 23.7 (H) 06/11/2019 1528   LYMPHSABS 0.7 06/11/2019 0219   MONOABS 0.3 06/13/2019 0219   EOSABS 0.0 06/11/2019 0219   BASOSABS 0.0 06/27/2019 0219   BMET    Component Value Date/Time   NA 148 (H) 06/11/2019 0221   K 4.1  06/11/2019 0221   CL 113 (H) 06/11/2019 0221   CO2 22 06/11/2019 0221   GLUCOSE 127 (H) 06/11/2019 0221   BUN 64 (H) 06/11/2019 0221   CREATININE 1.18 06/11/2019 0221   CALCIUM 8.7 (L) 06/11/2019 0221   GFRNONAA 54 (L) 06/11/2019 0221   GFRAA >60 06/11/2019 0221   Atrial fibrillation> tele, continue metoprolol, consider restarting amiodarone, digoxin lowered by cardiology today, switch back to eliquis today  Acute hypoxemic respiratory failure> most likley acute pulmonary edema> check CXR, lasix today, maintain NPO  Aspiration risk> maintain npo, cor-track  Giant cell arteritis> continue prednisone  He tells me today on rounds that he is tired and ready to die.  Will discuss this with family and consider palliative care consultation.  CC Time 35 minutes  Roselie Awkward, MD [facility] Pager: [phone] Cell: [phone] If no response, call [phone]

## 2019-06-13 NOTE — Progress Notes (Signed)
Patient has progressively increased O2 needs throughout the night. Patient has increased from 5L to 15L HFNC.. During bath patients O2 dropped to the 60s, 15L high flow NRB placed on patient. O2 stats increased back to the 90s. E-link notified, will continue to monitor.

## 2019-06-13 NOTE — Progress Notes (Signed)
PALLIATIVE NOTE:  Spoke with daughter, [name] via phone. She is requesting to meet tomorrow 06/13/19 at the bedside with her father for further goals of care discussion. She states she is available at 3pm. She wishes to be able to have this discussion face-to-face in his presence to allow him the ability to express his wishes to her and myself.   Confirmed goals of care meeting tomorrow 07-04-2019 @ 3pm. Daughter knows I will meet her at patient's room. Also aware of frail condition and high risk of further decompensation and/or sudden death prior to meeting. DNR confirmed.   Alda Lea, AGPCNP-BC Palliative Medicine Team  Phone: [phone] Amion: N. Cousar   No CHARGE

## 2019-06-13 NOTE — Hospital Course (Addendum)
Spoke with daughter [name] 5/11:  - Would not like long term feeding tube - reports he has had an eating disorder his entire life, has been obsessed with calories/food  -Arrange for family meeting with Daughter, Hiyab, and Palliative if possible this week

## 2019-06-13 NOTE — Progress Notes (Signed)
Progress Note  Patient Name: Matthew Hickman Date of Encounter: 06/13/2019  Primary Cardiologist: Sherren Mocha, MD   Subjective   No angina .Hard of hearing Has Cortrak in place   Inpatient Medications    Scheduled Meds: . chlorhexidine  15 mL Mouth Rinse BID  . Chlorhexidine Gluconate Cloth  6 each Topical Daily  . digoxin  0.25 mg Intravenous Daily  . enoxaparin (LOVENOX) injection  50 mg Subcutaneous Q12H  . insulin aspart  1-3 Units Subcutaneous Q4H  . mouth rinse  15 mL Mouth Rinse q12n4p  . metoprolol tartrate  100 mg Per Tube BID  . pantoprazole (PROTONIX) IV  40 mg Intravenous Q24H  . predniSONE  20 mg Per Tube Q breakfast   Continuous Infusions: . sodium chloride Stopped (06/11/19 0807)  . diltiazem (CARDIZEM) infusion Stopped (06/12/19 1456)  . feeding supplement (OSMOLITE 1.2 CAL) 40 mL/hr at 06/13/19 0740   PRN Meds: sodium chloride, albuterol, docusate sodium, polyethylene glycol, Resource ThickenUp Clear   Vital Signs    Vitals:   06/13/19 0630 06/13/19 0700 06/13/19 0726 06/13/19 0800  BP: 128/62 127/71    Pulse: (!) 53 (!) 56    Resp: (!) 23 (!) 22    Temp:   (!) 97.5 F (36.4 C)   TempSrc:   Axillary   SpO2: 95% 90%  96%  Weight:      Height:        Intake/Output Summary (Last 24 hours) at 06/13/2019 0823 Last data filed at 06/13/2019 0800 Gross per 24 hour  Intake 644.76 ml  Output 765 ml  Net -120.24 ml   Filed Weights   06/11/19 0500 06/12/19 0500 06/13/19 0430  Weight: 53.9 kg 53.1 kg 52.5 kg    Telemetry    Atrial fib/flutter with a RVR - Personally Reviewed  ECG    Afib rate 117 artifact 06/03/2019   Physical Exam   Frail elderly mail  HEENT: Cortrak tube in place  Neck supple with no adenopathy JVP normal no bruits no thyromegaly Lungs clear with no wheezing and good diaphragmatic motion Heart:  S1/S2 no murmur, no rub, gallop or click PMI normal Abdomen: benighn, BS positve, no tenderness, no AAA no bruit.  No HSM or  HJR Distal pulses intact with no bruits No edema Neuro non-focal Skin warm and dry No muscular weakness   Labs    Chemistry Recent Labs  Lab 06/07/19 0302 06/07/19 0302 06/26/2019 0219 07/02/2019 0520 06/11/19 0221  NA 139   < > 144 142 148*  K 4.3   < > 4.5 4.0 4.1  CL 105  --  107  --  113*  CO2 24  --  24  --  22  GLUCOSE 198*  --  113*  --  127*  BUN 40*  --  65*  --  64*  CREATININE 0.85  --  1.20  --  1.18  CALCIUM 8.8*  --  8.3*  --  8.7*  PROT  --   --  4.3*  --  4.9*  ALBUMIN  --   --  2.0*  --  2.9*  AST  --   --  60*  --  45*  ALT  --   --  52*  --  38  ALKPHOS  --   --  94  --  74  BILITOT  --   --  1.3*  --  1.6*  GFRNONAA >60  --  53*  --  54*  GFRAA >60  --  >60  --  >60  ANIONGAP 10  --  13  --  13   < > = values in this interval not displayed.     Hematology Recent Labs  Lab 07/01/2019 0745 06/24/2019 0745 06/11/19 0221 06/11/19 0424 06/11/19 1528  WBC 13.9*  --  8.0  --  10.3  RBC 2.18*  --  2.01*  --  2.79*  HGB 7.4*   < > 6.8* 6.6* 9.0*  HCT 23.3*   < > 21.5* 20.7* 28.2*  MCV 106.9*  --  107.0*  --  101.1*  MCH 33.9  --  33.8  --  32.3  MCHC 31.8  --  31.6  --  31.9  RDW 21.4*  --  21.3*  --  23.7*  PLT 100*  --  97*  --  96*   < > = values in this interval not displayed.    Cardiac EnzymesNo results for input(s): TROPONINI in the last 168 hours. No results for input(s): TROPIPOC in the last 168 hours.   BNP Recent Labs  Lab 06/26/2019 0745  BNP 127.0*     DDimer No results for input(s): DDIMER in the last 168 hours.   Radiology    DG Swallowing Func-Speech Pathology  Result Date: 06/11/2019 Objective Swallowing Evaluation: Type of Study: MBS-Modified Barium Swallow Study  Patient Details Name: Matthew Hickman MRN: [id number] Date of Birth: May 16, 1925 Today's Date: 06/11/2019 Time: SLP Start Time (ACUTE ONLY): 1206 -SLP Stop Time (ACUTE ONLY): 1235 SLP Time Calculation (min) (ACUTE ONLY): 29 min Past Medical History: Past Medical History:  Diagnosis Date . Cancer ([place])   skin . Hypertension  Past Surgical History: Past Surgical History: Procedure Laterality Date . APPENDECTOMY   . ARTERY BIOPSY Bilateral 04/07/2019  Procedure: BIOPSY TEMPORAL ARTERY;  Surgeon: Jesusita Oka, MD;  Location: [facility];  Service: General;  Laterality: Bilateral; . CARDIOVERSION N/A 06/02/2019  Procedure: CARDIOVERSION;  Surgeon: Donato Heinz, MD;  Location: [facility];  Service: Cardiovascular;  Laterality: N/A; . MELANOMA EXCISION Bilateral 01/30/2019  Procedure: WIDE LOCAL EXCISION WITH ADVANCEMENT FLAP CLOSURE OF MELANOMA LEFT NECK AND RIGHT BACK;  Surgeon: [name], MD;  Location: [facility];  Service: General;  Laterality: Bilateral; . TEE WITHOUT CARDIOVERSION N/A 06/02/2019  Procedure: TRANSESOPHAGEAL ECHOCARDIOGRAM (TEE);  Surgeon: Donato Heinz, MD;  Location: [facility] ENDOSCOPY;  Service: Cardiovascular;  Laterality: N/A; HPI: Matthew Hickman is a 86 y.o. male with a hx of HTN, skin cancer and giant cell arteritis, a fib with recent discharge 06/07/19  who is being seen today for the evaluation of Acute CHF.  CXR reported: "Progressive bibasilar airspace disease consistent with edema."  Subjective: Pt was cooperative Assessment / Plan / Recommendation CHL IP CLINICAL IMPRESSIONS 06/11/2019 Clinical Impression Pt was seen for a modified barium swallow study and he presents with moderate oropharyngeal dysphagia with resultant aspiration of thin liquid via straw sip on today's examination.  Laryngeal penetration occurred before the swallow and it resulted in aspiration during/after the swallow secondary to delayed swallow initiation/laryngeal closure.  Aspiration was sensed, but pt was unable to clear aspirated material despite spontaneous and cued coughing.  Laryngeal penetration/aspiration was not observed with nectar-thick liquid, puree, or thin liquid via tsp; however moderate-severe vallecular residue, mild pyriform sinus residue, and trace posterior  pharyngeal wall residue was noted across most PO trials.  Pt was unable to fully clear pharyngeal residue despite multiple swallows which places him at an increased risk  for aspiration with all PO intake.  Pharyngeal phase was remarkable for reduced BOT retraction, reduced hyolaryngeal excursion, and reduced pharyngeal constriction, resulting in the beforementioned pharyngeal residue.  Oral phase was remarkable for reduced lingual control resulting in premature spillage to the pharynx and reduced lingual strength resulting in trace-moderate oral residue.  Pharyngoesophageal phase was remarkable for esophageal backflow into the pyriform sinus and suspected reduced duration of UES opening with questionable CP bar (radiologist was not present to confirm).  Recommend that pt begin snacks of Dysphagia 1 (puree) solids and nectar-thick liquids from the floor stock with full RN supervision and strict adherence to compensatory strategies.  Pt may have a few small spoon sips of water PRN following thorough oral care given full RN supervision.  If pt/family wish to pursue a liberalized diet with comfort measures, would recommend Dysphagia 1 (puree) solids and thin liquids.    SLP Visit Diagnosis Dysphagia, oropharyngeal phase (R13.12);Dysphagia, pharyngoesophageal phase (R13.14) Attention and concentration deficit following -- Frontal lobe and executive function deficit following -- Impact on safety and function Moderate aspiration risk;Severe aspiration risk   CHL IP TREATMENT RECOMMENDATION 06/11/2019 Treatment Recommendations Therapy as outlined in treatment plan below   Prognosis 06/11/2019 Prognosis for Safe Diet Advancement Guarded Barriers to Reach Goals Severity of deficits Barriers/Prognosis Comment -- CHL IP DIET RECOMMENDATION 06/11/2019 SLP Diet Recommendations (No Data) Liquid Administration via Spoon;Straw Medication Administration Via alternative means Compensations Small sips/bites;Slow rate;Minimize environmental  distractions;Multiple dry swallows after each bite/sip;Effortful swallow Postural Changes Remain semi-upright after after feeds/meals (Comment);Seated upright at 90 degrees   CHL IP OTHER RECOMMENDATIONS 06/11/2019 Recommended Consults Consider GI evaluation Oral Care Recommendations Oral care QID;Staff/trained caregiver to provide oral care;Oral care before and after PO Other Recommendations Have oral suction available;Remove water pitcher;Prohibited food (jello, ice cream, thin soups)   CHL IP FOLLOW UP RECOMMENDATIONS 06/11/2019 Follow up Recommendations Skilled Nursing facility;24 hour supervision/assistance   CHL IP FREQUENCY AND DURATION 06/11/2019 Speech Therapy Frequency (ACUTE ONLY) min 2x/week Treatment Duration 2 weeks      CHL IP ORAL PHASE 06/11/2019 Oral Phase Impaired Oral - Pudding Teaspoon -- Oral - Pudding Cup -- Oral - Honey Teaspoon -- Oral - Honey Cup -- Oral - Nectar Teaspoon -- Oral - Nectar Cup -- Oral - Nectar Straw Premature spillage;Piecemeal swallowing;Lingual/palatal residue Oral - Thin Teaspoon Lingual/palatal residue Oral - Thin Cup -- Oral - Thin Straw Premature spillage;Piecemeal swallowing;Lingual/palatal residue Oral - Puree Lingual/palatal residue;Piecemeal swallowing Oral - Mech Soft Lingual/palatal residue;Piecemeal swallowing;Impaired mastication Oral - Regular -- Oral - Multi-Consistency -- Oral - Pill -- Oral Phase - Comment --  CHL IP PHARYNGEAL PHASE 06/11/2019 Pharyngeal Phase Impaired Pharyngeal- Pudding Teaspoon -- Pharyngeal -- Pharyngeal- Pudding Cup -- Pharyngeal -- Pharyngeal- Honey Teaspoon -- Pharyngeal -- Pharyngeal- Honey Cup -- Pharyngeal -- Pharyngeal- Nectar Teaspoon -- Pharyngeal -- Pharyngeal- Nectar Cup -- Pharyngeal -- Pharyngeal- Nectar Straw Delayed swallow initiation-vallecula;Pharyngeal residue - valleculae;Pharyngeal residue - pyriform;Reduced pharyngeal peristalsis;Reduced anterior laryngeal mobility;Reduced tongue base retraction Pharyngeal Material does not  enter airway Pharyngeal- Thin Teaspoon Delayed swallow initiation-vallecula;Pharyngeal residue - valleculae;Pharyngeal residue - pyriform;Reduced anterior laryngeal mobility;Reduced tongue base retraction Pharyngeal Material does not enter airway Pharyngeal- Thin Cup -- Pharyngeal -- Pharyngeal- Thin Straw Delayed swallow initiation-pyriform sinuses;Reduced pharyngeal peristalsis;Reduced anterior laryngeal mobility;Reduced airway/laryngeal closure;Reduced tongue base retraction;Penetration/Aspiration before swallow;Penetration/Aspiration during swallow;Penetration/Apiration after swallow;Pharyngeal residue - pyriform;Moderate aspiration;Trace aspiration;Pharyngeal residue - posterior pharnyx Pharyngeal Material enters airway, passes BELOW cords and not ejected out despite cough attempt by patient Pharyngeal- Puree Delayed swallow  initiation-vallecula;Pharyngeal residue - valleculae;Pharyngeal residue - pyriform;Pharyngeal residue - posterior pharnyx;Reduced pharyngeal peristalsis;Reduced anterior laryngeal mobility;Reduced tongue base retraction Pharyngeal Material does not enter airway Pharyngeal- Mechanical Soft Other (Comment) Pharyngeal -- Pharyngeal- Regular -- Pharyngeal -- Pharyngeal- Multi-consistency -- Pharyngeal -- Pharyngeal- Pill -- Pharyngeal -- Pharyngeal Comment --  CHL IP CERVICAL ESOPHAGEAL PHASE 06/11/2019 Cervical Esophageal Phase Impaired Pudding Teaspoon -- Pudding Cup -- Honey Teaspoon -- Honey Cup -- Nectar Teaspoon -- Nectar Cup -- Nectar Straw Esophageal backflow into the pharynx;Prominent cricopharyngeal segment Thin Teaspoon -- Thin Cup -- Thin Straw -- Puree -- Mechanical Soft -- Regular -- Multi-consistency -- Pill -- Cervical Esophageal Comment -- Colin Mulders M.S., CCC-SLP Acute Rehabilitation Services Office: [phone] [place] 06/11/2019, 2:30 PM               Cardiac Studies   TEE:  06/02/19  EF 55-60% mild MR   Patient Profile     86 y.o. male admitted with acute CHF  and atrial fib/flutter with a RVR  Assessment & Plan    1. Atrial fib/LA flutter - failed swallow test Cortrak in place Change digoxin to tube as is lopressor  2. Acute on chronic diastolic heart failure - appears euvolemic   Signed, Jenkins Rouge, MD  06/13/2019, 8:23 AM

## 2019-06-13 NOTE — Progress Notes (Signed)
NAME:  Matthew Hickman, MRN:  [id number], DOB:  [birth year], LOS: 3 ADMISSION DATE:  06/04/2019, CONSULTATION DATE:  06/13/19 REFERRING MD:  EDP, CHIEF COMPLAINT:  Dyspnea   Brief History   86 year old male with a history of paroxysmal atrial fibrillation, HFpEF, giant cell arteritis on chronic prednisone who was brought in from SNF for shortness of breath.  Possible pulmonary edema versus sepsis, required BiPAP in the ED and blood pressure borderline, so PCCM consulted for admission  History of present illness   Matthew Hickman is a 86 year old male with past medical history of paroxysmal atrial fibrillation, hypertension, HFpEF, and giant cell arteritis on chronic prednisone who presents from SNF for dyspnea.  Was recently admitted and diagnosed with atrial fibrillation requiring 10 and initiated on amiodarone, Lopressor and Eliquis and discharged on 06/07/19.  He was also treated for left forearm cellulitis and Pseudomonas UTI, discharged on Cipro and Augmentin.  Per ED report, patient was feeling short of breath all day and was on 4 L nonrebreather mask on EMS arrival.  Hypotensive with SBP in the 80s, given approximately 500 cc IV fluids.  Systolic improved [id number] at the time of admission.  Chest x-ray showed likely pulmonary edema, minimally changed from prior film.  Lactic acid 2.1, no leukocytosis, very mildly elevated LFTs, urinalysis and respiratory viral panel negative.  EKG showed atrial fibrillation, rate 120s.  ABG 7.57/27 point 4/63/20 5.3. Patient  was awake and alert at the time of admission, he was confused, but yelling and protecting his airway well and tolerating BiPAP.  He was given vancomycin, cefepime, Flagyl, glucagon to reverse beta-blocker, and Lasix 40 mg and PCCM consulted for admission   Past Medical History  Atrial fibrillation on Eliquis, GCA on chronic prednisone, HFpEF  Significant Hospital Events   06/13/19 Admit to PCCM  Consults:  Cards   Procedures:  5/10: Cortrak  placed 5/9: Transfused 1u prbc  Significant Diagnostic Tests:  5/10 CXR>>Progressive bibasilar airspace disease consistent with edema.  Micro Data:  5/10 RVP>>negative 5/10 BCx2>> NGTD 5/10 UC>> NG   Antimicrobials:  Cefepime 5/8-5/9 Vancomycin 5/8-5/9 Flagyl 5/8-5/9  Interim history/subjective:  NAE. Pt home Lopressor restarted and 1x Digoxin given per cards. Pt failed swallow study and Cortrak was placed; he is tolerating tube feeds. VSS, with some HR variability from 50s-100s. He has had increasing SpO2 requirement from [facility] now to SpO2 low 90s on 15L NRB. He is not in respiratory distress and talks without desaturation. UOP 758mL in last 24 net -91mL.   On rounds today, pt expresses that he is "ready to die" and "Has to go". He is A&Ox3 and appears cognizant of the current situation.   Objective   Blood pressure 127/71, pulse (!) 56, temperature (!) 97.5 F (36.4 C), temperature source Axillary, resp. rate (!) 22, height 5\' 8"  (1.727 m), weight 52.5 kg, SpO2 96 %.        Intake/Output Summary (Last 24 hours) at 06/13/2019 0854 Last data filed at 06/13/2019 0800 Gross per 24 hour  Intake 644.76 ml  Output 765 ml  Net -120.24 ml   Filed Weights   06/11/19 0500 06/12/19 0500 06/13/19 0430  Weight: 53.9 kg 53.1 kg 52.5 kg    Gen: Elderly, cachectic man on NRB in NAD. A&O x3. Answers questions appropriately HEENT: atraumatic, normocephalic, sclera anicteric, PERRL, dry mucous membranes. Neck: no cervical lymphadenopathy, JVD to mid neck  Heart: RR, irregular rhythm, S1, S2, no M/R/G, no chest wall tenderness Lungs: CTAB, no crackles or  wheezes, normal work of breathing Abdomen: Normoactive bowel sounds, soft, NTND, no rebound/guarding, no hepatosplenomegaly Extremities: no clubbing, cyanosis. 2+ pitting edema in bilateral legs to mid ankles. pulses are +2 in bilateral upper and lower extremities Neuro: CN II-XI grossly intact. No focal deficits. Skin: Cellulitis on L  forearm appears stable. Ecchymoses in bilateral extremities.  Psych: Normal mood and affect.   Resolved Hospital Problem list   None  Assessment & Plan:   Acute hypoxic respiratory failure 2/2 HFpEF: Pt now having desaturations and increasing O2 requirement from 6L HFNC to 15L NRB now sattign low 90s. Last echo on 06/02/19 showed LVEF 0000000, grade I diastolic dysfunction and elevated PA pressures. He is clinically volume up at present, and prior CXR have shown pulm edema. UOP not robust and pt now tolerating tube feeds well.  - Continue resp support; wean NRB as possible  - Cards consulted and following - Lasix 40 mg IV q6h for 2 doses  - Digoxin per Cards  - AF RVR mgmt as below  - repeat CXR  Paroxysmal atrial fibrillation with RVR: Pt recently diagnosed with AF on Eliquis, amiodarone, and Lopressor at home. Lopressor and Eliquis restarted yesterday, and dilt drip now off. Rates have been well controlled but with some variability down to 50s. I think this may be due to the large dose of Metop tartrate (100mg  q12h); we will split this further and reduce dose to 25 q6h to see if we can stable out that rate. Amiodarone also likely still in his system.   - Continue to hold home amio for now  - Dilt drip off. Titrate prn for rates > 110  - Lasix as above  - Restart home Eliquis in place of Lovenox   Chronic aspiration based on swallowing evaluation: Pt had chronic aspiration on swallowing eval; this may explain plan his cachexia and his pulmonary status. Cortrak placed on 5/10 and he is tolerating tube feeds well. We will need to have larger discussion regarding goals of care and advisability of PEG tube feeding in chronically debilitated elderly man. - Cortrak feeding tube for now  History of GCA: Pt diagnosed less than a year ago with GCA, now has significant visual difficulty.  -Continue home Prednisone dose  Thrombocytopenia, anemia: Pt has had thrombocytopenia and macrocytic anemia  throughout this hospitalization and in prior hospitalizations. TSH wnl. Folate pending, B12 wnl. This could have a component of malnutrition however also suspect marrow failure and/or myelodysplastic syndrome.  - Folate pending -Smear review pending  -No specific treatment indicated given age and poor performance status.  FEN: Cortraks placed  - K>4, Mg>2 for AF  - Tube feeds   Goals of Care: Pt was made DNR earlier this hospitalization after discussion with his daughter. On rounds on 5/10, he agreed to placement of Cortraks for feeding. On 5/11, he began to have some increasing O2 requirement and remarked "I have to go" while pointing up or down, and said he's "ready to die". He appears alert, oriented, and cognizant of the current situation. However, he is acutely ill. We will speak to his daughter about his wishes and the possibility of a palliative care consult.   Best practice:  Diet: Tube feeds  Pain/Anxiety/Delirium protocol (if indicated): N/A VAP protocol (if indicated): N/A DVT prophylaxis: Eliquis  GI prophylaxis: Protonix Glucose control: SSI Mobility: Bedrest Code Status: DNR Family Communication: Spoke with daughter 5/10  Disposition: ICU   Ivin Poot MS4

## 2019-06-13 NOTE — Progress Notes (Signed)
   Daily Progress Note   Patient Name: Matthew Hickman       Date: 06/13/2019 DOB: 15-Nov-1925  Age: 86 y.o. MRN#: [id number] Attending Physician: Juanito Doom, MD Primary Care Physician: [name], MD Admit Date: 06/25/2019  Reason for Consultation/Follow-up: Establishing goals of care  Subjective: Patient awake, A&O x3. No acute distress noted. NRB and Coretrak in place. Denies pain or discomfort. Will follow commands. States he prefers to have the mask off but knows it is helping him to breath a little better. Matthew Hickman has shared that he is "tired and ready to leave this earth". He reaches out to hold my hand and thanks me for support. No family is at the bedside.   I was able to speak with patient's daughter, Matthew Hickman via phone. I expressed to her, patient's previously expressed wishes also providing medical updates. Melissa verbalized understanding. She would like to meet face to face and have a continued discussion with her father allowing him to express his wishes and making some final decisions. She states she would like to meet tomorrow 17-Jun-2019 at 3pm if available. I confirmed meeting time and she is aware I will meet with her at the bedside. Daughter is aware of patient's increase in oxygen need and high risk of decompensation. She is hopeful this conversation can take place tomorrow and her father will be able to participate.   All questions answered. Support given.   Length of Stay: 3 days  Vital Signs: BP 118/65   Pulse (!) 117   Temp (!) 96.8 F (36 C) (Axillary)   Resp (!) 33   Ht 5\' 8"  (1.727 m)   Wt 52.5 kg   SpO2 100%   BMI 17.60 kg/m  SpO2: SpO2: 100 % O2 Device: O2 Device: NRB O2 Flow Rate: O2 Flow Rate (L/min): 15 L/min  Intake/output summary:   Intake/Output Summary (Last 24 hours) at 06/13/2019 1343 Last data filed at 06/13/2019 1100 Gross per 24 hour  Intake 804.76 ml  Output 765 ml  Net 39.76 ml   LBM:   Baseline Weight: Weight: 56 kg Most recent  weight: Weight: 52.5 kg  Physical Exam: -NAD, chronically-ill appearing, thin -tachycardic -NRB, diminished bilaterally -scattered bruising, thin skin -A&O x3, mood appropriate, follows commands.            Palliative Care Assessment & Plan    Code Status:  DNR Prognosis: Poor  Discharge Planning: To Be Determined   Recommendations:  DNR/DNI-as confirmed   Continue current plan of care per medical team  Daughter requesting further [place] meeting tomorrow 06/17/19 @ 1500. She is aware I will meet her at the patient's bedside.   Care plan was discussed with daughter, Matthew Hickman and Therapist, sports.   Thank you for allowing the Palliative Medicine Team to assist in the care of this patient.  Time Total: 35 min.   Visit consisted of counseling and education dealing with the complex and emotionally intense issues of symptom management and palliative care in the setting of serious and potentially life-threatening illness.Greater than 50%  of this time was spent counseling and coordinating care related to the above assessment and plan.  Alda Lea, AGPCNP-BC  Palliative Medicine Team [phone]

## 2019-06-14 DIAGNOSIS — I4819 Other persistent atrial fibrillation: Secondary | ICD-10-CM

## 2019-06-14 LAB — CBC WITH DIFFERENTIAL/PLATELET
Abs Immature Granulocytes: 0.81 10*3/uL — ABNORMAL HIGH (ref 0.00–0.07)
Basophils Absolute: 0 10*3/uL (ref 0.0–0.1)
Basophils Relative: 0 %
Eosinophils Absolute: 0 10*3/uL (ref 0.0–0.5)
Eosinophils Relative: 0 %
HCT: 31 % — ABNORMAL LOW (ref 39.0–52.0)
Hemoglobin: 9.3 g/dL — ABNORMAL LOW (ref 13.0–17.0)
Immature Granulocytes: 7 %
Lymphocytes Relative: 7 %
Lymphs Abs: 0.8 10*3/uL (ref 0.7–4.0)
MCH: 32.4 pg (ref 26.0–34.0)
MCHC: 30 g/dL (ref 30.0–36.0)
MCV: 108 fL — ABNORMAL HIGH (ref 80.0–100.0)
Monocytes Absolute: 0.3 10*3/uL (ref 0.1–1.0)
Monocytes Relative: 3 %
Neutro Abs: 9.5 10*3/uL — ABNORMAL HIGH (ref 1.7–7.7)
Neutrophils Relative %: 83 %
Platelets: 106 10*3/uL — ABNORMAL LOW (ref 150–400)
RBC: 2.87 MIL/uL — ABNORMAL LOW (ref 4.22–5.81)
RDW: 22.2 % — ABNORMAL HIGH (ref 11.5–15.5)
WBC: 11.5 10*3/uL — ABNORMAL HIGH (ref 4.0–10.5)
nRBC: 1.2 % — ABNORMAL HIGH (ref 0.0–0.2)

## 2019-06-14 LAB — BASIC METABOLIC PANEL
Anion gap: 14 (ref 5–15)
BUN: 76 mg/dL — ABNORMAL HIGH (ref 8–23)
CO2: 24 mmol/L (ref 22–32)
Calcium: 9.1 mg/dL (ref 8.9–10.3)
Chloride: 121 mmol/L — ABNORMAL HIGH (ref 98–111)
Creatinine, Ser: 1.65 mg/dL — ABNORMAL HIGH (ref 0.61–1.24)
GFR calc Af Amer: 41 mL/min — ABNORMAL LOW (ref >60)
GFR calc non Af Amer: 36 mL/min — ABNORMAL LOW (ref >60)
Glucose, Bld: 253 mg/dL — ABNORMAL HIGH (ref 70–99)
Potassium: 4.6 mmol/L (ref 3.5–5.1)
Sodium: 159 mmol/L — ABNORMAL HIGH (ref 135–145)

## 2019-06-14 LAB — GLUCOSE, CAPILLARY
Glucose-Capillary: 126 mg/dL — ABNORMAL HIGH (ref 70–99)
Glucose-Capillary: 188 mg/dL — ABNORMAL HIGH (ref 70–99)
Glucose-Capillary: 218 mg/dL — ABNORMAL HIGH (ref 70–99)
Glucose-Capillary: 221 mg/dL — ABNORMAL HIGH (ref 70–99)
Glucose-Capillary: 224 mg/dL — ABNORMAL HIGH (ref 70–99)

## 2019-06-14 LAB — MAGNESIUM: Magnesium: 2.4 mg/dL (ref 1.7–2.4)

## 2019-06-14 MED ORDER — ONDANSETRON HCL 4 MG/2ML IJ SOLN: 4.0000 mg | Freq: Four times a day (QID) | INTRAMUSCULAR | Status: DC | PRN

## 2019-06-14 MED ORDER — PANTOPRAZOLE SODIUM 40 MG PO PACK
40.0000 mg | PACK | Freq: Every day | ORAL | Status: DC
  Administered 2019-06-14: 40 mg
  Filled 2019-06-14: qty 20

## 2019-06-14 MED ORDER — GLYCOPYRROLATE 0.2 MG/ML IJ SOLN: 0.3000 mg | INTRAMUSCULAR | Status: DC | PRN

## 2019-06-14 MED ORDER — ACETAMINOPHEN 650 MG RE SUPP: 650.0000 mg | Freq: Four times a day (QID) | RECTAL | Status: DC | PRN

## 2019-06-14 MED ORDER — METOPROLOL TARTRATE 25 MG/10 ML ORAL SUSPENSION: 100.0000 mg | Freq: Two times a day (BID) | ORAL | Status: DC

## 2019-06-14 MED ORDER — FREE WATER
300.0000 mL | Status: DC
  Administered 2019-06-14: 300 mL

## 2019-06-14 MED ORDER — ORAL CARE MOUTH RINSE: 15.0000 mL | OROMUCOSAL | Status: DC | PRN

## 2019-06-14 MED ORDER — LORAZEPAM 2 MG/ML IJ SOLN: 0.5000 mg | INTRAMUSCULAR | Status: DC | PRN

## 2019-06-14 MED ORDER — MORPHINE 100MG IN NS 100ML (1MG/ML) PREMIX INFUSION
2.0000 mg/h | INTRAVENOUS | Status: DC
  Administered 2019-06-14: 3 mg/h via INTRAVENOUS
  Filled 2019-06-14: qty 100

## 2019-06-14 MED ORDER — CHLORHEXIDINE GLUCONATE 0.12 % MT SOLN: 15.0000 mL | OROMUCOSAL | Status: DC | PRN

## 2019-06-14 MED ORDER — FUROSEMIDE 10 MG/ML IJ SOLN
40.0000 mg | Freq: Four times a day (QID) | INTRAMUSCULAR | Status: DC
  Administered 2019-06-14: 40 mg via INTRAVENOUS
  Filled 2019-06-14: qty 4

## 2019-06-14 MED ORDER — HALOPERIDOL LACTATE 5 MG/ML IJ SOLN: 0.5000 mg | INTRAMUSCULAR | Status: DC | PRN

## 2019-06-14 MED ORDER — MORPHINE SULFATE (PF) 2 MG/ML IV SOLN
2.0000 mg | INTRAVENOUS | Status: DC | PRN
  Administered 2019-06-14: 2 mg via INTRAVENOUS
  Filled 2019-06-14: qty 1

## 2019-06-14 MED ORDER — POLYVINYL ALCOHOL 1.4 % OP SOLN
1.0000 [drp] | Freq: Four times a day (QID) | OPHTHALMIC | Status: DC | PRN
  Filled 2019-06-14: qty 15

## 2019-06-14 MED ORDER — MORPHINE BOLUS VIA INFUSION
2.0000 mg | INTRAVENOUS | Status: DC | PRN
  Administered 2019-06-14: 18:00:00 2 mg via INTRAVENOUS
  Filled 2019-06-14: qty 2

## 2019-06-15 LAB — CULTURE, BLOOD (ROUTINE X 2)
Culture: NO GROWTH
Culture: NO GROWTH
Special Requests: ADEQUATE

## 2019-07-04 NOTE — Progress Notes (Signed)
Daily Progress Note   Patient Name: Matthew Hickman       Date: July 07, 2019 DOB: 09/25/1925  Age: 86 y.o. MRN#: [id number] Attending Physician: Juanito Doom, MD Primary Care Physician: [name], MD Admit Date: 06/16/2019  Reason for Consultation/Follow-up: Establishing goals of care, Non pain symptom management, Pain control and Withdrawal of life-sustaining treatment  Subjective: Patient somewhat lethargic but easily aroused. He will open eyes and respond appropriately to verbal commands. Remains on NRB mask with some increased work of breathing noted when attempting to talk or move around in bed. Seems somewhat agitated at times, pulling at mittens. Daughter/POA, [name] is at the bedside.   Updates provided on patient's condition and poor prognosis. We discussed patient's expressed wishes yesterday to multiple team members including myself that he was tired and wanted to die. Daughter tearful. She verbalized she did not wish for him to suffer and wanted to honor his wishes. Patient was able to verbalize to his daughter that he was tired. He stated "please let me go, I am tired!" Melissa was tearful expressing her love to her father and her plans to allow him to be comfortable and at peace. He nods his head in a yes motion with eyes closed. Emotional support given.   Detailed education provided to [facility] regarding comfort care and what that would look like. She is aware patient would continue to receive great care with a focus on his comfort now. She verbalized understanding of discontinuation of all medical interventions, telemetry, lab work, radiology exams, feedings, and medications not focused on comfort and symptom management. We dicussed removal of Cortrak and NRB for comfort. Melissa verbalized her agreement and wishes to proceed with all comfort measures.   I discussed in detail symptom management for comfort including the use of continuous morphine for comfort in the setting of  respiratory distress once NRB is removed, and other medications for anxiety, agitation, secretions, and nausea. Melissa verbalized understanding and wishes to proceed with a goal of comfort and elimination as much as possible for suffering.   Daughter spent time sharing memories with her father and stories he told her from serving in the [employer]. She states he was tough and knows this is tearing him apart lying in this condition because he would not want to go through life like this. Therapeutic listening and support given.   Melissa would like to continue with NRB until all family has arrived and visited with patient and expressed their goodbyes in fear he would pass prior to their arrival. His son and wife are on the way to be with patient. Education provided on visitation policy. Support given.   All questions answered and support provided.    Interval Events:  Length of Stay: 4 days  Vital Signs: BP 109/62 (BP Location: Right Arm)   Pulse (!) 121   Temp 98.6 F (37 C) (Axillary)   Resp (!) 30   Ht 5\' 8"  (1.727 m)   Wt 51.6 kg   SpO2 93%   BMI 17.30 kg/m  SpO2: SpO2: 93 % O2 Device: O2 Device: NRB O2 Flow Rate: O2 Flow Rate (L/min): 15 L/min  Intake/output summary:   Intake/Output Summary (Last 24 hours) at 2019/07/07 1543 Last data filed at 2019-07-07 1400 Gross per 24 hour  Intake 2180 ml  Output 1335 ml  Net 845 ml   LBM:   Baseline Weight: Weight: 56 kg Most recent weight: Weight: 51.6 kg  Physical Exam: -lethargic, chronically-ill appearing, thin -irregular -  bilateral crackles, NRB  -lethargic but easily aroused  Palliative Care Assessment & Plan    Code Status:  DNR Goals of Care:  DNR/DNI-as confirmed by daughter/POA  Transition all care to full comfort/EOL  Family on the way to be at the bedside for support and comfort. Education provided on the visitation policy.  Morphine PRN for pain/air hunger/comfort  Morpine drip orders placed under [facility] to  be released once family has arrived and given the approval to remove NRB. Until then will administer PRN morphine.  Robinul PRN for excessive secretions Ativan PRN for agitation/anxiety Zofran PRN for nausea Liquifilm tears PRN for dry eyes Haldol PRN for agitation/anxiety NPO  Comfort cart for family Unrestricted visitations in the setting of EOL (per policy) Oxygen PRN 2L or less for comfort. No escalation. WILL CONTINUE WITH NRB UNTIL FAMILY ARRIVES AND GIVES THE OK TO REMOVE AND PROCEED WITH NASAL CANNULA.   Prognosis: Hours - Days Discharge Planning: Anticipated Hospital Death   Care plan was discussed with Mahala Menghini, RN and secured message to Dr. Lake Bells.   Thank you for allowing the Palliative Medicine Team to assist in the care of this patient.  Time Total: 50 min.   Visit consisted of counseling and education dealing with the complex and emotionally intense issues of symptom management and palliative care in the setting of serious and potentially life-threatening illness.Greater than 50%  of this time was spent counseling and coordinating care related to the above assessment and plan.  Alda Lea, AGPCNP-BC  Palliative Medicine Team [phone]

## 2019-07-04 NOTE — Progress Notes (Signed)
[facility]  Full comfort measures  Palliative care consultation appreciated Transfer to palliative care floor  Roselie Awkward, MD [place] [facility] Pager: [phone] Cell: [phone] If no response, call [phone]

## 2019-07-04 NOTE — Progress Notes (Signed)
Wasted ~100 mL of morphine in waste bin with Antonietta Breach, RN.   Matthew Hickman

## 2019-07-04 NOTE — Progress Notes (Signed)
Patient expired at [place], pronounced by Weldon Inches, RN and myself.  Family at bedside during his passing.  CDS has been notified.

## 2019-07-04 NOTE — Progress Notes (Signed)
Patient started on morphine gtt, and placed on full comfort measures.  Non-rebreather was removed.  Family is at bedside.  Will continue to monitor patient for comfort.

## 2019-07-04 NOTE — Progress Notes (Signed)
Attending:    Subjective: Diuresed some with lasix yesterday But remains on NRB mask with increased work of breathing Morphine prn seems to help his work of breathing/dyspnea Palliative care to meet with family this afternoon  Objective: Vitals:   06/17/19 0721 17-Jun-2019 0738 06-17-19 0749 17-Jun-2019 0800  BP:  (!) 108/59 (!) 108/59 113/66  Pulse:  (!) 153 (!) 125 (!) 57  Resp:  (!) 32  (!) 28  Temp: (!) 97.5 F (36.4 C)     TempSrc: Axillary     SpO2:  (!) 85%  92%  Weight:      Height:          Intake/Output Summary (Last 24 hours) at Jun 17, 2019 0906 Last data filed at 2019-06-17 0800 Gross per 24 hour  Intake 1730 ml  Output 1810 ml  Net -80 ml    General:  Increased work of breathing in bed HENT: NCAT OP clear PULM: Crackles bases B, normal effort CV: Irreg irreg, JVP ~ 8 cm ASA GI: BS+, soft, nontender MSK: normal bulk and tone Neuro: awake, alert, no distress, MAEW    CBC    Component Value Date/Time   WBC 13.4 (H) 06/13/2019 1241   RBC 3.15 (L) 06/13/2019 1241   HGB 10.2 (L) 06/13/2019 1241   HCT 33.1 (L) 06/13/2019 1241   HCT 28.0 (L) 06/12/2019 1122   PLT 138 (L) 06/13/2019 1241   MCV 105.1 (H) 06/13/2019 1241   MCH 32.4 06/13/2019 1241   MCHC 30.8 06/13/2019 1241   RDW 22.5 (H) 06/13/2019 1241   LYMPHSABS 0.3 (L) 06/13/2019 1241   MONOABS 0.3 06/13/2019 1241   EOSABS 0.0 06/13/2019 1241   BASOSABS 0.0 06/13/2019 1241    BMET    Component Value Date/Time   NA 158 (H) 06/13/2019 1241   K 4.0 06/13/2019 1241   CL 120 (H) 06/13/2019 1241   CO2 21 (L) 06/13/2019 1241   GLUCOSE 234 (H) 06/13/2019 1241   BUN 59 (H) 06/13/2019 1241   CREATININE 1.32 (H) 06/13/2019 1241   CALCIUM 9.4 06/13/2019 1241   GFRNONAA 47 (L) 06/13/2019 1241   GFRAA 54 (L) 06/13/2019 1241    CXR images personally reviewed, bilateral pulmonary edema noted, bilateral effusions  Impression/Plan: Atrial fibrillation> continue rate control with metoprolol, digoxin,  appreciate cardiology Hypernatermia> increase free water frequency Acute pulmonary edema from acute decompensated diastolic heart failure> diurese with lasix again today  Goals of care> palliative care to meet with him and family today, greatly appreciate their support.  Would favor comfort measures based on yesterday's conversation and advanced age with multiple comorbid illnesses.  My cc time 31 minutes  Roselie Awkward, MD [place] [facility] Pager: [phone] Cell: [phone] After 3pm or if no response, call [phone]

## 2019-07-04 NOTE — Death Summary Note (Signed)
DEATH SUMMARY   Patient Details  Name: Matthew Hickman MRN: [id number] DOB: [birth year]  Admission/Discharge Information   Admit Date:  06/18/2019  Date of Death: Date of Death: 2019/06/22  Time of Death: Time of Death: 03-25-56  Length of Stay: 4  Referring Physician: [name], MD   Reason(s) for Hospitalization  Dyspnea  Diagnoses  Preliminary cause of death: Acute decompensated diastolic heart failure  Secondary Diagnoses (including complications and co-morbidities):  Active Problems:   Respiratory failure (HCC)   Goals of care, counseling/discussion   Palliative care by specialist   Pressure injury of skin   Acute respiratory failure with hypoxemia (HCC) Atrial fibrillation Moderate to severe malnutrition Acute pulmonary edema Chronic prednisone use Giant cell arteritis  Brief Hospital Course (including significant findings, care, treatment, and services provided and events leading to death)  Matthew Hickman is a 86 y.o. year old male who was admitted from a SNF on 06-18-2022 with dyspnea.  He had an extensive past medical history including chronic prednisone use for giant cell arteritis.  He had recently been admitted for treatment of cellulitis and a UTI.  He was admitted to the hospital, treated with antibiotics and oxygen.  However his dyspnea worsned and his atrial fibillration was more difficult to control.  He was moved to the ICU.  A feeding tube was placed and attempts at diuresis were made but despite this his respiratory rate, heart rate, and sensation of dyspnea were very difficult to control.  He expressed wishes to die a peaceful death.  Palliative care was consulted and met with the family.  He was started on morphine and died with family present.     Pertinent Labs and Studies  Significant Diagnostic Studies DG Elbow Complete Left  Result Date: 05/29/2019 CLINICAL DATA:  Fall, laceration to left elbow EXAM: LEFT ELBOW - COMPLETE 3+ VIEW COMPARISON:  None. FINDINGS: There  is no evidence of fracture, dislocation, or joint effusion. There is no evidence of arthropathy or other focal bone abnormality. Soft tissues are unremarkable. IMPRESSION: Negative. Electronically Signed   By: Matthew Hickman M.D.   On: 05/29/2019 10:54   DG Forearm Left  Result Date: 06/02/2019 CLINICAL DATA:  Swelling and tenderness.  oozing sores on forearm. EXAM: LEFT FOREARM - 2 VIEW COMPARISON:  None. FINDINGS: No fracture in the radius or ulna. A 7 mm lucency is identified in the anterior cortex of the radius, near the junction of the middle and distal thirds, indeterminate. No evidence for soft tissue gas. IMPRESSION: 7 mm indeterminate lucency in the anterior cortex of the mid radius. If the patient has a soft tissue sore in this region, MRI may be warranted to exclude osteomyelitis. Electronically Signed   By: Matthew Hickman M.D.   On: 06/02/2019 10:41   CT Head Wo Contrast  Result Date: 05/29/2019 CLINICAL DATA:  Head trauma.  Mechanical fall. EXAM: CT HEAD WITHOUT CONTRAST TECHNIQUE: Contiguous axial images were obtained from the base of the skull through the vertex without intravenous contrast. COMPARISON:  04/07/2019 MRI head. FINDINGS: Brain: No acute infarct or intracranial hemorrhage. Bilateral basal ganglia calcifications. Re-demonstration of small meningiomas overlying the right frontal and parietal convexities. No midline shift or extra-axial fluid collection. Vascular: No hyperdense vessel. Bilateral carotid siphon and V4 segment atherosclerotic calcifications. Skull: Negative for fracture or focal lesion. Sinuses/Orbits: Bilateral lens replacement. No mastoid effusion. Small left maxillary sinus mucous retention cyst. Other: None. IMPRESSION: No acute intracranial process. Mild cerebral atrophy. Unchanged small right cerebral  convexity meningiomas. Electronically Signed   By: Matthew Hickman M.D.   On: 05/29/2019 10:57   MR FOREARM LEFT W WO CONTRAST  Result Date: 06/03/2019 CLINICAL  DATA:  Mid radial lesion on radiography, for further characterization. EXAM: MRI OF THE LEFT FOREARM WITHOUT AND WITH CONTRAST TECHNIQUE: Multiplanar, multisequence MR imaging of the left forearm was performed before and after the administration of intravenous contrast. CONTRAST:  5.86m GADAVIST GADOBUTROL 1 MMOL/ML IV SOLN COMPARISON:  Radiographs from 06/02/2019 FINDINGS: Bones/Joint/Cartilage The small lucency in the radius has fatty signal in represents subtle endosteal scalloping possibly at a nutrient foramen. There is no abnormal enhancement in the bone or other specific worrisome bony characteristics, and the appearance is considered benign/incidental. No osteomyelitis is observed. Ligaments N/A Muscles and Tendons Field heterogeneity around the elbow and wrist which reduces sensitivity in assessing for edema. No intramuscular abscess is identified. Soft tissues Along the dorsal superficial fascial margin and subcutaneous tissues of the distal forearm, and in the marked location, there is abnormal enhancement around a irregular 3.5 by 0.8 by 4.0 cm (volume = 6 cm^3) fluid collection in the subcutaneous tissues which appears to be draining to the cutaneous surface on image 40/13. The appearance suggests a small subcutaneous abscess draining to the skin surface. Uncertain patency of a small overlying superficial vein. IMPRESSION: 1. Small subcutaneous abscess along the dorsal superficial fascial margin of the distal forearm and in the marked location. This appears to likely be draining to the skin surface. No underlying osteomyelitis or intramuscular abscess. 2. Small lucency in the radius is considered benign/incidental, and represents a small incidental focus of endosteal scalloping. 3. Field heterogeneity around the elbow and wrist reduces sensitivity in assessing for in these regions. Electronically Signed   By: Matthew ClinesM.D.   On: 06/03/2019 18:51   DG CHEST PORT 1 VIEW  Result Date:  06/13/2019 CLINICAL DATA:  Acute respiratory failure with hypoxemia. EXAM: PORTABLE CHEST 1 VIEW COMPARISON:  Jun 10, 2019. FINDINGS: Stable cardiomediastinal silhouette. Feeding tube is seen entering stomach. Increased bibasilar opacities are noted concerning for worsening edema or pneumonia. No pneumothorax or significant pleural effusion is noted. Bony thorax is unremarkable. IMPRESSION: Increased bibasilar opacities are noted concerning for worsening edema or pneumonia. Feeding tube is seen entering stomach. Electronically Signed   By: JMarijo ConceptionM.D.   On: 06/13/2019 13:58   DG Chest Port 1 View  Result Date: 06/06/2019 CLINICAL DATA:  Shortness of breath, tachycardia, tachypnea EXAM: PORTABLE CHEST 1 VIEW COMPARISON:  06/06/2019 FINDINGS: Single frontal view of the chest demonstrates a stable cardiac silhouette. Progressive bibasilar interstitial and ground-glass opacities. Trace bilateral effusions. No pneumothorax. IMPRESSION: 1. Progressive bibasilar airspace disease consistent with edema. Electronically Signed   By: MRanda NgoM.D.   On: 06/09/2019 02:12   DG CHEST PORT 1 VIEW  Result Date: 06/06/2019 CLINICAL DATA:  Chest pain.  Shortness of breath. EXAM: PORTABLE CHEST 1 VIEW COMPARISON:  05/29/2019.  04/04/2019. FINDINGS: Cardiomegaly. Diffuse bilateral pulmonary interstitial prominence and bilateral pleural effusions. Findings suggest CHF. Pneumonitis cannot be excluded. Degenerative change thoracic spine. IMPRESSION: Cardiomegaly with diffuse bilateral interstitial prominence and bilateral pleural effusions suggesting CHF. Pneumonitis cannot be excluded. Electronically Signed   By: TMarcello Moores Register   On: 06/06/2019 13:56   DG CHEST PORT 1 VIEW  Result Date: 05/29/2019 CLINICAL DATA:  Pain following fall EXAM: PORTABLE CHEST 1 VIEW COMPARISON:  April 04, 2019 FINDINGS: There is atelectatic change in the left base. Lungs elsewhere  are clear. Heart size and pulmonary vascularity are  normal. No adenopathy. There is aortic atherosclerosis. No pneumothorax. No appreciable bone lesions. IMPRESSION: Left base atelectasis. Lungs elsewhere clear. Stable cardiac silhouette. No pneumothorax. Aortic Atherosclerosis (ICD10-I70.0). Electronically Signed   By: Lowella Grip III M.D.   On: 05/29/2019 13:27   DG Swallowing Func-Speech Pathology  Result Date: 06/11/2019 Objective Swallowing Evaluation: Type of Study: MBS-Modified Barium Swallow Study  Patient Details Name: FRUTOSO DIMARE MRN: [id number] Date of Birth: Jun 05, 1925 Today's Date: 06/11/2019 Time: SLP Start Time (ACUTE ONLY): 1206 -SLP Stop Time (ACUTE ONLY): 1235 SLP Time Calculation (min) (ACUTE ONLY): 29 min Past Medical History: Past Medical History: Diagnosis Date . Cancer ([place])   skin . Hypertension  Past Surgical History: Past Surgical History: Procedure Laterality Date . APPENDECTOMY   . ARTERY BIOPSY Bilateral 04/07/2019  Procedure: BIOPSY TEMPORAL ARTERY;  Surgeon: Jesusita Oka, MD;  Location: [place];  Service: General;  Laterality: Bilateral; . CARDIOVERSION N/A 06/02/2019  Procedure: CARDIOVERSION;  Surgeon: Donato Heinz, MD;  Location: [place];  Service: Cardiovascular;  Laterality: N/A; . MELANOMA EXCISION Bilateral 01/30/2019  Procedure: WIDE LOCAL EXCISION WITH ADVANCEMENT FLAP CLOSURE OF MELANOMA LEFT NECK AND RIGHT BACK;  Surgeon: [name], MD;  Location: [place];  Service: General;  Laterality: Bilateral; . TEE WITHOUT CARDIOVERSION N/A 06/02/2019  Procedure: TRANSESOPHAGEAL ECHOCARDIOGRAM (TEE);  Surgeon: Donato Heinz, MD;  Location: [facility] ENDOSCOPY;  Service: Cardiovascular;  Laterality: N/A; HPI: VALMORE ARABIE is a 86 y.o. male with a hx of HTN, skin cancer and giant cell arteritis, a fib with recent discharge 06/07/19  who is being seen today for the evaluation of Acute CHF.  CXR reported: "Progressive bibasilar airspace disease consistent with edema."  Subjective: Pt was cooperative Assessment /  Plan / Recommendation CHL IP CLINICAL IMPRESSIONS 06/11/2019 Clinical Impression Pt was seen for a modified barium swallow study and he presents with moderate oropharyngeal dysphagia with resultant aspiration of thin liquid via straw sip on today's examination.  Laryngeal penetration occurred before the swallow and it resulted in aspiration during/after the swallow secondary to delayed swallow initiation/laryngeal closure.  Aspiration was sensed, but pt was unable to clear aspirated material despite spontaneous and cued coughing.  Laryngeal penetration/aspiration was not observed with nectar-thick liquid, puree, or thin liquid via tsp; however moderate-severe vallecular residue, mild pyriform sinus residue, and trace posterior pharyngeal wall residue was noted across most PO trials.  Pt was unable to fully clear pharyngeal residue despite multiple swallows which places him at an increased risk for aspiration with all PO intake.  Pharyngeal phase was remarkable for reduced BOT retraction, reduced hyolaryngeal excursion, and reduced pharyngeal constriction, resulting in the beforementioned pharyngeal residue.  Oral phase was remarkable for reduced lingual control resulting in premature spillage to the pharynx and reduced lingual strength resulting in trace-moderate oral residue.  Pharyngoesophageal phase was remarkable for esophageal backflow into the pyriform sinus and suspected reduced duration of UES opening with questionable CP bar (radiologist was not present to confirm).  Recommend that pt begin snacks of Dysphagia 1 (puree) solids and nectar-thick liquids from the floor stock with full RN supervision and strict adherence to compensatory strategies.  Pt may have a few small spoon sips of water PRN following thorough oral care given full RN supervision.  If pt/family wish to pursue a liberalized diet with comfort measures, would recommend Dysphagia 1 (puree) solids and thin liquids.    SLP Visit Diagnosis  Dysphagia, oropharyngeal phase (R13.12);Dysphagia, pharyngoesophageal  phase (R13.14) Attention and concentration deficit following -- Frontal lobe and executive function deficit following -- Impact on safety and function Moderate aspiration risk;Severe aspiration risk   CHL IP TREATMENT RECOMMENDATION 06/11/2019 Treatment Recommendations Therapy as outlined in treatment plan below   Prognosis 06/11/2019 Prognosis for Safe Diet Advancement Guarded Barriers to Reach Goals Severity of deficits Barriers/Prognosis Comment -- CHL IP DIET RECOMMENDATION 06/11/2019 SLP Diet Recommendations (No Data) Liquid Administration via Spoon;Straw Medication Administration Via alternative means Compensations Small sips/bites;Slow rate;Minimize environmental distractions;Multiple dry swallows after each bite/sip;Effortful swallow Postural Changes Remain semi-upright after after feeds/meals (Comment);Seated upright at 90 degrees   CHL IP OTHER RECOMMENDATIONS 06/11/2019 Recommended Consults Consider GI evaluation Oral Care Recommendations Oral care QID;Staff/trained caregiver to provide oral care;Oral care before and after PO Other Recommendations Have oral suction available;Remove water pitcher;Prohibited food (jello, ice cream, thin soups)   CHL IP FOLLOW UP RECOMMENDATIONS 06/11/2019 Follow up Recommendations Skilled Nursing facility;24 hour supervision/assistance   CHL IP FREQUENCY AND DURATION 06/11/2019 Speech Therapy Frequency (ACUTE ONLY) min 2x/week Treatment Duration 2 weeks      CHL IP ORAL PHASE 06/11/2019 Oral Phase Impaired Oral - Pudding Teaspoon -- Oral - Pudding Cup -- Oral - Honey Teaspoon -- Oral - Honey Cup -- Oral - Nectar Teaspoon -- Oral - Nectar Cup -- Oral - Nectar Straw Premature spillage;Piecemeal swallowing;Lingual/palatal residue Oral - Thin Teaspoon Lingual/palatal residue Oral - Thin Cup -- Oral - Thin Straw Premature spillage;Piecemeal swallowing;Lingual/palatal residue Oral - Puree Lingual/palatal residue;Piecemeal  swallowing Oral - Mech Soft Lingual/palatal residue;Piecemeal swallowing;Impaired mastication Oral - Regular -- Oral - Multi-Consistency -- Oral - Pill -- Oral Phase - Comment --  CHL IP PHARYNGEAL PHASE 06/11/2019 Pharyngeal Phase Impaired Pharyngeal- Pudding Teaspoon -- Pharyngeal -- Pharyngeal- Pudding Cup -- Pharyngeal -- Pharyngeal- Honey Teaspoon -- Pharyngeal -- Pharyngeal- Honey Cup -- Pharyngeal -- Pharyngeal- Nectar Teaspoon -- Pharyngeal -- Pharyngeal- Nectar Cup -- Pharyngeal -- Pharyngeal- Nectar Straw Delayed swallow initiation-vallecula;Pharyngeal residue - valleculae;Pharyngeal residue - pyriform;Reduced pharyngeal peristalsis;Reduced anterior laryngeal mobility;Reduced tongue base retraction Pharyngeal Material does not enter airway Pharyngeal- Thin Teaspoon Delayed swallow initiation-vallecula;Pharyngeal residue - valleculae;Pharyngeal residue - pyriform;Reduced anterior laryngeal mobility;Reduced tongue base retraction Pharyngeal Material does not enter airway Pharyngeal- Thin Cup -- Pharyngeal -- Pharyngeal- Thin Straw Delayed swallow initiation-pyriform sinuses;Reduced pharyngeal peristalsis;Reduced anterior laryngeal mobility;Reduced airway/laryngeal closure;Reduced tongue base retraction;Penetration/Aspiration before swallow;Penetration/Aspiration during swallow;Penetration/Apiration after swallow;Pharyngeal residue - pyriform;Moderate aspiration;Trace aspiration;Pharyngeal residue - posterior pharnyx Pharyngeal Material enters airway, passes BELOW cords and not ejected out despite cough attempt by patient Pharyngeal- Puree Delayed swallow initiation-vallecula;Pharyngeal residue - valleculae;Pharyngeal residue - pyriform;Pharyngeal residue - posterior pharnyx;Reduced pharyngeal peristalsis;Reduced anterior laryngeal mobility;Reduced tongue base retraction Pharyngeal Material does not enter airway Pharyngeal- Mechanical Soft Other (Comment) Pharyngeal -- Pharyngeal- Regular -- Pharyngeal --  Pharyngeal- Multi-consistency -- Pharyngeal -- Pharyngeal- Pill -- Pharyngeal -- Pharyngeal Comment --  CHL IP CERVICAL ESOPHAGEAL PHASE 06/11/2019 Cervical Esophageal Phase Impaired Pudding Teaspoon -- Pudding Cup -- Honey Teaspoon -- Honey Cup -- Nectar Teaspoon -- Nectar Cup -- Nectar Straw Esophageal backflow into the pharynx;Prominent cricopharyngeal segment Thin Teaspoon -- Thin Cup -- Thin Straw -- Puree -- Mechanical Soft -- Regular -- Multi-consistency -- Pill -- Cervical Esophageal Comment -- Colin Mulders M.S., CCC-SLP Acute Rehabilitation Services Office: [phone] [place] 06/11/2019, 2:30 PM              ECHO TEE  Result Date: 06/02/2019    TRANSESOPHOGEAL ECHO REPORT   Patient Name:   SANAY BELMAR  Date of Exam: 06/02/2019 Medical Rec #:  [id number]      Height:       68.0 in Accession #:    [id number]     Weight:       124.6 lb Date of Birth:  1925/05/31      BSA:          1.671 m Patient Age:    14 years       BP:           114/55 mmHg Patient Gender: M              HR:           81 bpm. Exam Location:  Inpatient Procedure: Transesophageal Echo, Cardiac Doppler and Color Doppler Indications:     Atrial fibrillation 427.31/I48.91  History:         Patient has prior history of Echocardiogram examinations, most                  recent 04/05/2019. Arrythmias:Atrial Flutter and Tachycardia;                  Risk Factors:Hypertension.  Sonographer:     Vikki Ports Turrentine Referring Phys:  [id number] Donato Heinz Diagnosing Phys: Oswaldo Milian MD PROCEDURE: The transesophogeal probe was passed without difficulty through the esophogus of the patient. Sedation performed by different physician. The patient was monitored while under deep sedation. Anesthestetic sedation was provided intravenously by Anesthesiology: 22m of Propofol. The patient developed no complications during the procedure. IMPRESSIONS  1. Left ventricular ejection fraction, by estimation, is 55 to 60%. The left ventricle  has normal function. The left ventricle has no regional wall motion abnormalities.  2. Right ventricular systolic function is normal. The right ventricular size is normal.  3. The mitral valve is normal in structure. Mild mitral valve regurgitation.  4. The aortic valve is tricuspid. Aortic valve regurgitation is mild. No aortic stenosis is present.  5. Aortic dilatation noted. There is mild dilatation of the aortic root measuring 40 mm.  6. No left atrial/left atrial appendage thrombus was detected. FINDINGS  Left Ventricle: Left ventricular ejection fraction, by estimation, is 55 to 60%. The left ventricle has normal function. The left ventricle has no regional wall motion abnormalities. The left ventricular internal cavity size was normal in size. There is  no left ventricular hypertrophy. Right Ventricle: The right ventricular size is normal. Right vetricular wall thickness was not assessed. Right ventricular systolic function is normal. Left Atrium: Left atrial size was normal in size. No left atrial/left atrial appendage thrombus was detected. Right Atrium: Right atrial size was normal in size. Pericardium: There is no evidence of pericardial effusion. Mitral Valve: The mitral valve is normal in structure. Mild mitral valve regurgitation. Tricuspid Valve: The tricuspid valve is normal in structure. Tricuspid valve regurgitation is mild. Aortic Valve: The aortic valve is tricuspid. Aortic valve regurgitation is mild. No aortic stenosis is present. Pulmonic Valve: The pulmonic valve was grossly normal. Pulmonic valve regurgitation is mild. Aorta: Aortic dilatation noted. There is mild dilatation of the aortic root measuring 40 mm. IAS/Shunts: No atrial level shunt detected by color flow Doppler. COswaldo MilianMD Electronically signed by COswaldo MilianMD Signature Date/Time: 06/02/2019/3:46:30 PM    Final     Microbiology Recent Results (from the past 240 hour(s))  SARS CORONAVIRUS 2 (TAT 6-24  HRS) Nasopharyngeal Nasopharyngeal Swab     Status: None   Collection Time: 06/06/19 10:12 AM  Specimen: Nasopharyngeal Swab  Result Value Ref Range Status   SARS Coronavirus 2 NEGATIVE NEGATIVE Final    Comment: (NOTE) SARS-CoV-2 target nucleic acids are NOT DETECTED. The SARS-CoV-2 RNA is generally detectable in upper and lower respiratory specimens during the acute phase of infection. Negative results do not preclude SARS-CoV-2 infection, do not rule out co-infections with other pathogens, and should not be used as the sole basis for treatment or other patient management decisions. Negative results must be combined with clinical observations, patient history, and epidemiological information. The expected result is Negative. Fact Sheet for Patients: SugarRoll.be Fact Sheet for Healthcare Providers: https://www.woods-mathews.com/ This test is not yet approved or cleared by the [place] FDA and  has been authorized for detection and/or diagnosis of SARS-CoV-2 by FDA under an Emergency Use Authorization (EUA). This EUA will remain  in effect (meaning this test can be used) for the duration of the COVID-19 declaration under Section 56 4(b)(1) of the Act, 21 U.S.C. section 360bbb-3(b)(1), unless the authorization is terminated or revoked sooner. Performed at [facility], [place] [address] [place], [place] [address]   Respiratory Panel by RT PCR (Flu A&B, Covid) - Nasopharyngeal Swab     Status: None   Collection Time: 07/02/2019  1:51 AM   Specimen: Nasopharyngeal Swab  Result Value Ref Range Status   SARS Coronavirus 2 by RT PCR NEGATIVE NEGATIVE Final    Comment: (NOTE) SARS-CoV-2 target nucleic acids are NOT DETECTED. The SARS-CoV-2 RNA is generally detectable in upper respiratoy specimens during the acute phase of infection. The lowest concentration of SARS-CoV-2 viral copies this assay can detect is 131 copies/mL. A negative  result does not preclude SARS-Cov-2 infection and should not be used as the sole basis for treatment or other patient management decisions. A negative result may occur with  improper specimen collection/handling, submission of specimen other than nasopharyngeal swab, presence of viral mutation(s) within the areas targeted by this assay, and inadequate number of viral copies (<131 copies/mL). A negative result must be combined with clinical observations, patient history, and epidemiological information. The expected result is Negative. Fact Sheet for Patients:  PinkCheek.be Fact Sheet for Healthcare Providers:  GravelBags.it This test is not yet ap proved or cleared by the [place] FDA and  has been authorized for detection and/or diagnosis of SARS-CoV-2 by FDA under an Emergency Use Authorization (EUA). This EUA will remain  in effect (meaning this test can be used) for the duration of the COVID-19 declaration under Section 564(b)(1) of the Act, 21 U.S.C. section 360bbb-3(b)(1), unless the authorization is terminated or revoked sooner.    Influenza A by PCR NEGATIVE NEGATIVE Final   Influenza B by PCR NEGATIVE NEGATIVE Final    Comment: (NOTE) The Xpert Xpress SARS-CoV-2/FLU/RSV assay is intended as an aid in  the diagnosis of influenza from Nasopharyngeal swab specimens and  should not be used as a sole basis for treatment. Nasal washings and  aspirates are unacceptable for Xpert Xpress SARS-CoV-2/FLU/RSV  testing. Fact Sheet for Patients: PinkCheek.be Fact Sheet for Healthcare Providers: GravelBags.it This test is not yet approved or cleared by the [place] FDA and  has been authorized for detection and/or diagnosis of SARS-CoV-2 by  FDA under an Emergency Use Authorization (EUA). This EUA will remain  in effect (meaning this test can be used) for the  duration of the  Covid-19 declaration under Section 564(b)(1) of the Act, 21  U.S.C. section 360bbb-3(b)(1), unless the authorization is  terminated or revoked.  Performed at [facility], [place] [address], [place], [place] [address]   Blood Culture (routine x 2)     Status: None   Collection Time: 06/22/2019  2:21 AM   Specimen: BLOOD  Result Value Ref Range Status   Specimen Description BLOOD LEFT ARM  Final   Special Requests   Final    BOTTLES DRAWN AEROBIC ONLY Blood Culture results may not be optimal due to an inadequate volume of blood received in culture bottles   Culture   Final    NO GROWTH 5 DAYS Performed at [facility], [place] [address] [place], [place] [address]    Report Status 06/15/2019 FINAL  Final  Blood Culture (routine x 2)     Status: None   Collection Time: 06/17/2019  2:22 AM   Specimen: BLOOD  Result Value Ref Range Status   Specimen Description BLOOD RIGHT ARM  Final   Special Requests   Final    BOTTLES DRAWN AEROBIC AND ANAEROBIC Blood Culture adequate volume   Culture   Final    NO GROWTH 5 DAYS Performed at [facility], [place] [address], [place], [place] [address]    Report Status 06/15/2019 FINAL  Final  MRSA PCR Screening     Status: None   Collection Time: 06/04/2019  9:22 AM   Specimen: Nasopharyngeal  Result Value Ref Range Status   MRSA by PCR NEGATIVE NEGATIVE Final    Comment:        The GeneXpert MRSA Assay (FDA approved for NASAL specimens only), is one component of a comprehensive MRSA colonization surveillance program. It is not intended to diagnose MRSA infection nor to guide or monitor treatment for MRSA infections. Performed at [facility], [place] [address], [place], [place] [address]     Lab Basic Metabolic Panel: Recent Labs  Lab 07/03/2019 0219 07/01/2019 0520 06/11/19 0221 06/13/19 1241 Jun 15, 2019 1017  NA 144 142 148* 158* 159*  K 4.5 4.0 4.1 4.0 4.6  CL 107  --  113* 120* 121*  CO2 24  --  22 21*  24  GLUCOSE 113*  --  127* 234* 253*  BUN 65*  --  64* 59* 76*  CREATININE 1.20  --  1.18 1.32* 1.65*  CALCIUM 8.3*  --  8.7* 9.4 9.1  MG  --   --  2.6*  --  2.4  PHOS  --   --  5.0*  --   --    Liver Function Tests: Recent Labs  Lab 06/07/2019 0219 06/11/19 0221  AST 60* 45*  ALT 52* 38  ALKPHOS 94 74  BILITOT 1.3* 1.6*  PROT 4.3* 4.9*  ALBUMIN 2.0* 2.9*   No results for input(s): LIPASE, AMYLASE in the last 168 hours. No results for input(s): AMMONIA in the last 168 hours. CBC: Recent Labs  Lab 06/22/2019 0219 06/06/2019 0520 06/16/2019 0745 06/30/2019 0745 06/11/19 0221 06/11/19 0221 06/11/19 0424 06/11/19 1528 06/12/19 1122 06/13/19 1241 2019-06-15 1017  WBC 10.5   < > 13.9*  --  8.0  --   --  10.3  --  13.4* 11.5*  NEUTROABS 8.9*  --   --   --   --   --   --   --   --  12.0* 9.5*  HGB 8.7*   < > 7.4*   < > 6.8*  --  6.6* 9.0*  --  10.2* 9.3*  HCT 27.4*   < > 23.3*   < >  21.5*   < > 20.7* 28.2* 28.0* 33.1* 31.0*  MCV 105.4*   < > 106.9*  --  107.0*  --   --  101.1*  --  105.1* 108.0*  PLT 105*   < > 100*  --  97*  --   --  96*  --  138* 106*   < > = values in this interval not displayed.   Cardiac Enzymes: No results for input(s): CKTOTAL, CKMB, CKMBINDEX, TROPONINI in the last 168 hours. Sepsis Labs: Recent Labs  Lab 06/08/2019 0219 07/02/2019 0219 07/02/2019 0745 06/22/2019 0745 06/23/2019 0850 06/11/19 0221 06/11/19 1528 06/13/19 1241 07-07-19 1017  PROCALCITON  --   --  0.32  --   --   --   --   --   --   WBC 10.5   < > 13.9*   < >  --  8.0 10.3 13.4* 11.5*  LATICACIDVEN 2.1*  --   --   --  3.6*  --   --   --   --    < > = values in this interval not displayed.    Procedures/Operations  Small bore nasogastric feeding tube   Roselie Awkward 06/15/2019, 2:53 PM

## 2019-07-04 NOTE — Progress Notes (Signed)
Progress Note  Patient Name: Matthew Hickman Date of Encounter: Jun 21, 2019  Primary Cardiologist: Sherren Mocha, MD   Subjective   Family meeting latter today to go over goals of care Hard of hearing no cardiac complaints  Cortrak in place   Inpatient Medications    Scheduled Meds: . apixaban  2.5 mg Per Tube BID  . chlorhexidine  15 mL Mouth Rinse BID  . Chlorhexidine Gluconate Cloth  6 each Topical Daily  . digoxin  0.125 mg Per Tube Daily  . free water  200 mL Per Tube Q6H  . insulin aspart  1-3 Units Subcutaneous Q4H  . mouth rinse  15 mL Mouth Rinse q12n4p  . metoprolol tartrate  100 mg Per Tube BID  . pantoprazole sodium  40 mg Per Tube Daily  . predniSONE  20 mg Per Tube Q breakfast   Continuous Infusions: . sodium chloride Stopped (06/11/19 0807)  . feeding supplement (OSMOLITE 1.2 CAL) 60 mL/hr at 2019/06/21 0500   PRN Meds: sodium chloride, albuterol, docusate sodium, morphine injection, polyethylene glycol, Resource ThickenUp Clear   Vital Signs    Vitals:   21-Jun-2019 0721 2019/06/21 0738 21-Jun-2019 0749 Jun 21, 2019 0800  BP:  (!) 108/59 (!) 108/59 113/66  Pulse:  (!) 153 (!) 125 (!) 57  Resp:  (!) 32  (!) 28  Temp: (!) 97.5 F (36.4 C)     TempSrc: Axillary     SpO2:  (!) 85%  92%  Weight:      Height:        Intake/Output Summary (Last 24 hours) at Jun 21, 2019 0857 Last data filed at 2019/06/21 0800 Gross per 24 hour  Intake 1770 ml  Output 1810 ml  Net -40 ml   Filed Weights   06/12/19 0500 06/13/19 0430 Jun 21, 2019 0355  Weight: 53.1 kg 52.5 kg 51.6 kg    Telemetry    Atrial fib/flutter with a RVR - Personally Reviewed  ECG    Afib rate 100 -115 artifact 06/13/2019   Physical Exam   Frail elderly mail  HEENT: Cortrak tube in place  Neck supple with no adenopathy JVP normal no bruits no thyromegaly Lungs clear with no wheezing and good diaphragmatic motion Heart:  S1/S2 no murmur, no rub, gallop or click PMI normal Abdomen: benighn, BS  positve, no tenderness, no AAA no bruit.  No HSM or HJR Distal pulses intact with no bruits No edema Neuro non-focal Skin warm and dry No muscular weakness   Labs    Chemistry Recent Labs  Lab 06/05/2019 0219 06/22/2019 0219 06/25/2019 0520 06/11/19 0221 06/13/19 1241  NA 144   < > 142 148* 158*  K 4.5   < > 4.0 4.1 4.0  CL 107  --   --  113* 120*  CO2 24  --   --  22 21*  GLUCOSE 113*  --   --  127* 234*  BUN 65*  --   --  64* 59*  CREATININE 1.20  --   --  1.18 1.32*  CALCIUM 8.3*  --   --  8.7* 9.4  PROT 4.3*  --   --  4.9*  --   ALBUMIN 2.0*  --   --  2.9*  --   AST 60*  --   --  45*  --   ALT 52*  --   --  38  --   ALKPHOS 94  --   --  74  --   BILITOT 1.3*  --   --  1.6*  --   GFRNONAA 53*  --   --  54* 47*  GFRAA >60  --   --  >60 54*  ANIONGAP 13  --   --  13 17*   < > = values in this interval not displayed.     Hematology Recent Labs  Lab 06/11/19 0221 06/11/19 0221 06/11/19 0424 06/11/19 0424 06/11/19 1528 06/12/19 1122 06/13/19 1241  WBC 8.0  --   --   --  10.3  --  13.4*  RBC 2.01*  --   --   --  2.79*  --  3.15*  HGB 6.8*   < > 6.6*  --  9.0*  --  10.2*  HCT 21.5*   < > 20.7*   < > 28.2* 28.0* 33.1*  MCV 107.0*  --   --   --  101.1*  --  105.1*  MCH 33.8  --   --   --  32.3  --  32.4  MCHC 31.6  --   --   --  31.9  --  30.8  RDW 21.3*  --   --   --  23.7*  --  22.5*  PLT 97*  --   --   --  96*  --  138*   < > = values in this interval not displayed.    Cardiac EnzymesNo results for input(s): TROPONINI in the last 168 hours. No results for input(s): TROPIPOC in the last 168 hours.   BNP Recent Labs  Lab 06/21/2019 0745  BNP 127.0*     DDimer No results for input(s): DDIMER in the last 168 hours.   Radiology    DG CHEST PORT 1 VIEW  Result Date: 06/13/2019 CLINICAL DATA:  Acute respiratory failure with hypoxemia. EXAM: PORTABLE CHEST 1 VIEW COMPARISON:  Jun 10, 2019. FINDINGS: Stable cardiomediastinal silhouette. Feeding tube is seen  entering stomach. Increased bibasilar opacities are noted concerning for worsening edema or pneumonia. No pneumothorax or significant pleural effusion is noted. Bony thorax is unremarkable. IMPRESSION: Increased bibasilar opacities are noted concerning for worsening edema or pneumonia. Feeding tube is seen entering stomach. Electronically Signed   By: Marijo Conception M.D.   On: 06/13/2019 13:58    Cardiac Studies   TEE:  06/02/19  EF 55-60% mild MR   Patient Profile     86 y.o. male admitted with acute CHF and atrial fib/flutter with a RVR  Assessment & Plan    1. Atrial fib/LA flutter - failed swallow test Cortrak in place continue digoxin/ lopressor per tube on low dose eliquis 2. Acute on chronic diastolic heart failure - appears euvolemic to dry    Cardiology will sign off nothing else to add to care   Signed, Jenkins Rouge, MD  06/20/2019, 8:57 AM

## 2019-07-04 NOTE — Progress Notes (Signed)
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  Was recently admitted and diagnosed with atrial fibrillation requiring 10 and initiated on amiodarone, Lopressor and Eliquis and discharged on 06/07/19.  He was also treated for left forearm cellulitis and Pseudomonas UTI, discharged on Cipro and Augmentin.  Per ED report, patient was feeling short of breath all day and was on 4 L nonrebreather mask on EMS arrival.  Hypotensive with SBP in the 80s, given approximately 500 cc IV fluids.  Systolic improved [id number] at the time of admission.  Chest x-ray showed likely pulmonary edema, minimally changed from prior film.  Lactic acid 2.1, no leukocytosis, very mildly elevated LFTs, urinalysis and respiratory viral panel negative.  EKG showed atrial fibrillation, rate 120s.  ABG 7.57/27 point 4/63/20 5.3. Patient  was awake and alert at the time of admission, he was confused, but yelling and protecting his airway well and tolerating BiPAP.  He was given vancomycin, cefepime, Flagyl, glucagon to reverse beta-blocker, and Lasix 40 mg and PCCM consulted for admission.  Past Medical History  Atrial fibrillation on Eliquis, GCA on chronic prednisone, HFpEF  Significant Hospital Events   07-09-19 Admit to PCCM  Consults:  Cards   Procedures:  5/10: Cortrak placed 5/9: Transfused 1u prbc  Significant Diagnostic Tests:  5/10 CXR>>Progressive bibasilar airspace disease consistent with edema.  Micro Data:  5/10 RVP>>negative 5/10 BCx2>> NGTD 5/10 UC>> NG   Antimicrobials:  Cefepime 5/8-5/9 Vancomycin 5/8-5/9 Flagyl 5/8-5/9  Interim history/subjective:  5/11: NAE. On  rounds yesterday, pt expressed that he is "ready to die". He remains awake, alert, cognizant of situation, and following commands. Spoke with daughter, Matthew Hickman, and palliative consulted where he reiterated these wishes. Family meeting scheduled for 1500 today at bedside. Pt had adequate response to 2x 40mg  Lasix with 1.7L UOP (net -745). He remains in RVR (HR 120s - 140s). Lopressor held for BP 90/50 last night but otherwise stable at 100s/50s-80s. SpO2 98% on 15L NRB, significantly improves with PRN morphine.   Objective   Blood pressure (!) 94/58, pulse (!) 110, temperature 98.8 F (37.1 C), temperature source Oral, resp. rate 20, height 5\' 8"  (1.727 m), weight 51.6 kg, SpO2 100 %.        Intake/Output Summary (Last 24 hours) at 2019-07-09 0718 Last data filed at 07-09-19 0600 Gross per 24 hour  Intake 1030 ml  Output 1775 ml  Net -745 ml   Filed Weights   06/12/19 0500 06/13/19 0430 07/09/2019 0355  Weight: 53.1 kg 52.5 kg 51.6 kg    Gen: Elderly, cachectic man on NRB, in moderate respiratory distress. A&O x3. Answers questions appropriately HEENT: atraumatic, normocephalic, sclera anicteric, PERRL, dry mucous membranes. Neck: no cervical lymphadenopathy, JVD to mid neck  Heart: RR, irregular rhythm, S1, S2, no M/R/G, no chest wall tenderness Lungs: CTAB, no crackles or wheezes, normal work of breathing Abdomen: Normoactive bowel sounds, soft, NTND, no rebound/guarding, no hepatosplenomegaly Extremities: no clubbing, cyanosis. 1+ pitting edema in bilateral legs to mid ankles. pulses are +2 in bilateral upper and lower extremities Neuro: CN II-XI grossly intact. No focal deficits. Skin: Cellulitis on L forearm appears stable. Ecchymoses in bilateral extremities.  Psych:  Normal mood and affect.   Resolved Hospital Problem list   None  Assessment & Plan:   Acute hypoxic respiratory failure 2/2 HFpEF: Pt has had increasing O2 requirement from 6L HFNC to 15L NRB, now stable here with  SpO2 high 90s. Last TTE on 06/02/19 showed LVEF 0000000, grade I diastolic dysfunction and elevated PA pressures. CXR 5/10 showed worsening pulm edema; he had adequate response to Lasix 40mg  x2 yesterday. He remains in RVR which could be contributory or 2/2 his respiratory status. Morphine prn has significantly helped his O2 sats, but he is still currently having some respiratory distress. Still clinically volume up/euvolemic; we will repeat Lasix as it helped his breathing some.  - Continue resp support; wean NRB as possible  - Cards consulted and signed off - Repeat Lasix 40 mg IV q6h for 2 doses  - Digoxin - AF RVR mgmt as below  - Morphine 2 mg IV prn q3 hours for dyspnea or agitation   Paroxysmal atrial fibrillation with RVR: Pt recently diagnosed with AF (discharged 5/5) on Eliquis, amiodarone, and Lopressor at home. Current episode likely 2/2 worsening pulm edema or infection. Was initially on dilt drip which has been discontinued. He continues to be in RVR on tartrate 100 mg q12h and Digoxin. Amiodarone also likely still in his system. Tartrate held last night given BP 90/50 and HR significantly worse in 140s; now 110s after morning dose. - Continue to hold home amio for now  - Continue Lopressor. Change hold parameters - hold for SBP < 90 as his pressures will be variable.  - Lasix as above   - Dig as above  - Continue Eliquis  Chronic aspiration based on swallowing evaluation: Pt had chronic aspiration on swallowing eval; this may explain plan his cachexia and his pulmonary status. Cortrak placed on 5/10 and he is tolerating tube feeds well. We will need to have larger discussion regarding goals of care and advisability of PEG tube feeding in chronically debilitated elderly man. - Cortrak feeding tube for now  Hypernatremia: Pt had Na 158 on 5/11 PM labs. Was started on 200 mL free water q4h, repeat Bmet pending. He has had poor oral intake for the past several months, acutely worsening  since hospitalization; this is likely contributory. Pt continues to complain of thirst; we will increase to curb this.  - Increase to 300 mL free water q4h  - Daily Bmet   History of GCA: Pt diagnosed less than a year ago with GCA, now has significant visual difficulty.  -Continue home Prednisone dose  Thrombocytopenia, anemia: Pt has had thrombocytopenia and macrocytic anemia throughout this hospitalization and in prior hospitalizations. TSH wnl. Folate, B12 wnl. This could have a component of malnutrition however also suspect marrow failure and/or myelodysplastic syndrome. NTD given pt's overall health status and GOC discussion as below.  -Smear review pending  -No specific treatment indicated given age and poor clinical status   FEN: Cortraks placed  - K>4, Mg>2 for AF  - Tube feeds   Goals of Care: Pt was made DNR earlier this hospitalization after discussion with his daughter. On rounds on 5/10, he agreed to placement of Cortraks for feeding. On 5/11, he remarked "I have to go" while pointing up or down, and said he's "ready to die". He appears alert, oriented, and cognizant of the current situation. However, he is acutely ill. Spoke to daughter on 5/11 and agreed to palliative care consult; family meeting planned for 1500 today.  - Palliative  consulted  - Family meeting today at 1500 at bedside   Best practice:  Diet: Tube feeds  Pain/Anxiety/Delirium protocol (if indicated): N/A VAP protocol (if indicated): N/A DVT prophylaxis: Eliquis  GI prophylaxis: Protonix Glucose control: SSI Mobility: Bedrest Code Status: DNR Family Communication: Spoke with daughter 5/11, family meeting 5/12 1500 Disposition: ICU   Ivin Poot MS4

## 2019-07-04 DEATH — deceased

## 2021-05-11 IMAGING — MR MR HEAD WO/W CM
13 of 15 series · 40 of 48 positions shown · IV contrast (gadavist)
Comparison: MR orbits without and with contrast 04/04/2019

CLINICAL DATA: Binocular vision loss. Recent diagnosis of melanoma.
Suspected arteritis.

EXAM:
MRI HEAD WITHOUT AND WITH CONTRAST
TECHNIQUE: Multiplanar, multiecho pulse sequences of the brain and surrounding
structures were obtained without and with intravenous contrast.
CONTRAST:  5.5mL GADAVIST GADOBUTROL 1 MMOL/ML IV SOLN

[Series 5: DWI · axial · 3.0mm · 0.88mm/px · z∈[-39,+105]mm · 7 of 98 slices shown (1 of 4)]
[im 1/98]
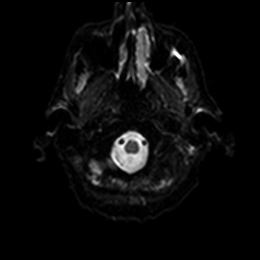
[im 17/98]
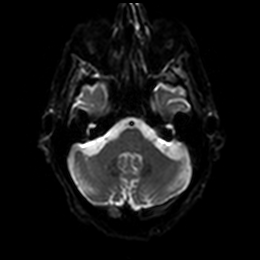
[im 33/98]
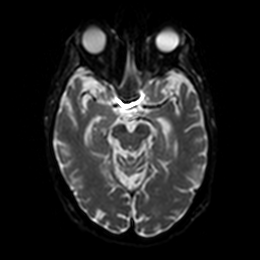
[im 49/98]
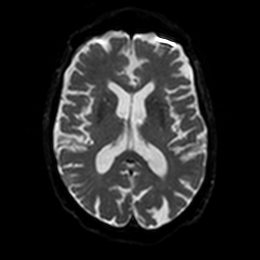
[im 65/98]
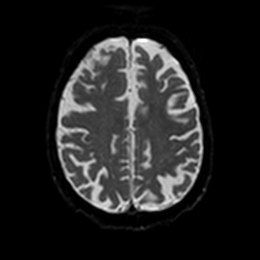
[im 81/98]
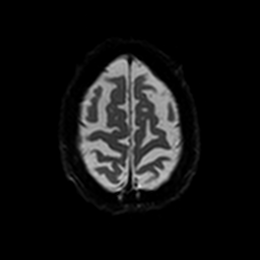
[im 98/98]
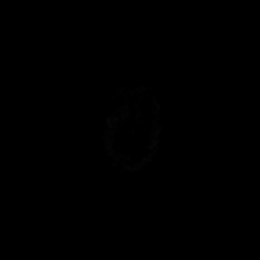

[Series 6: DWI · axial · 3.0mm · 0.88mm/px · z∈[-39,+105]mm · 3 of 49 slices shown (2 of 4)]
[im 1/49]
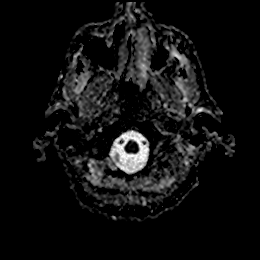
[im 25/49]
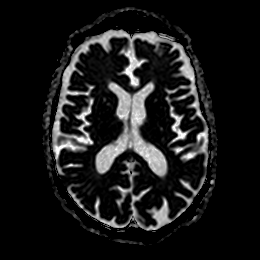
[im 49/49]
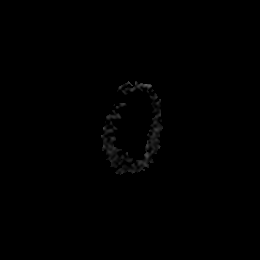

[Series 7: DWI · coronal · 4.0mm · 0.88mm/px · 5 of 74 slices shown (3 of 4)]
[im 1/74]
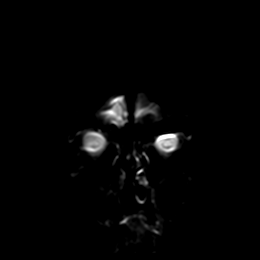
[im 19/74]
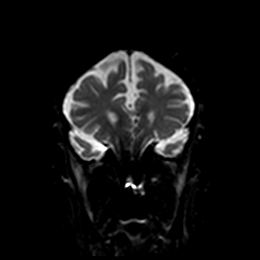
[im 37/74]
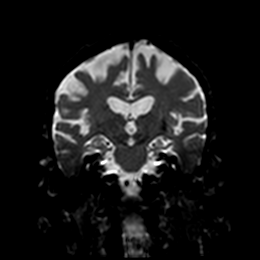
[im 55/74]
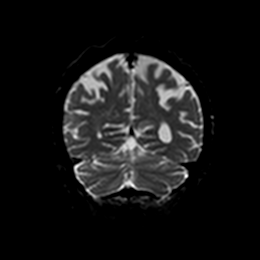
[im 74/74]
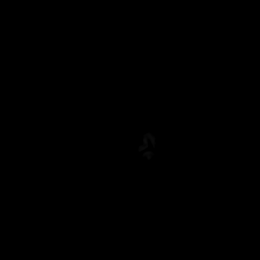

[Series 8: DWI · coronal · 4.0mm · 0.88mm/px · 3 of 37 slices shown (4 of 4)]
[im 1/37]
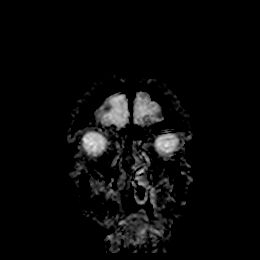
[im 19/37]
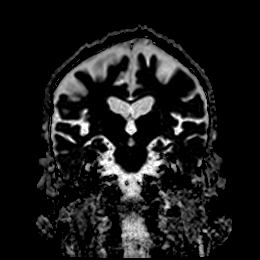
[im 37/37]
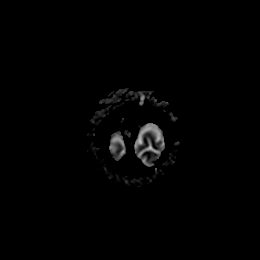

[Series 9: T1 · sagittal · 5.0mm · 0.77mm/px · 2 of 25 slices shown]
[im 1/25]
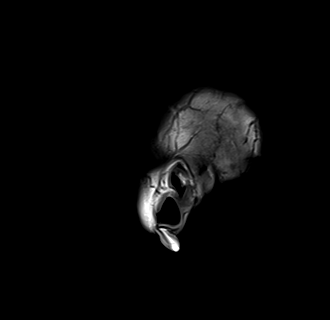
[im 25/25]
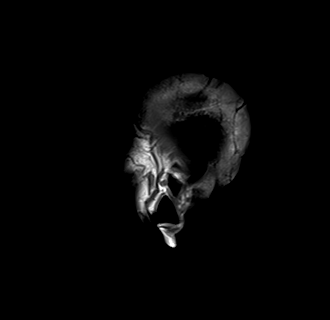

[Series 10: T2 · axial · 5.0mm · 0.72mm/px · z∈[-39,+105]mm · 2 of 25 slices shown]
[im 1/25]
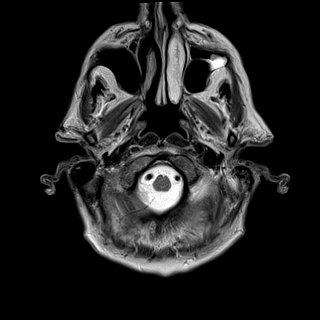
[im 25/25]
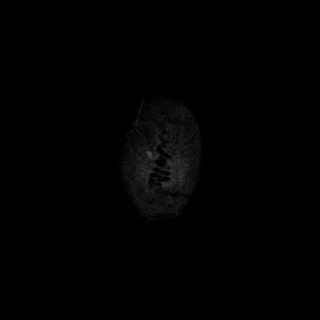

[Series 11: FLAIR · axial · 5.0mm · 0.45mm/px · z∈[-41,+103]mm · 2 of 25 slices shown]
[im 1/25]
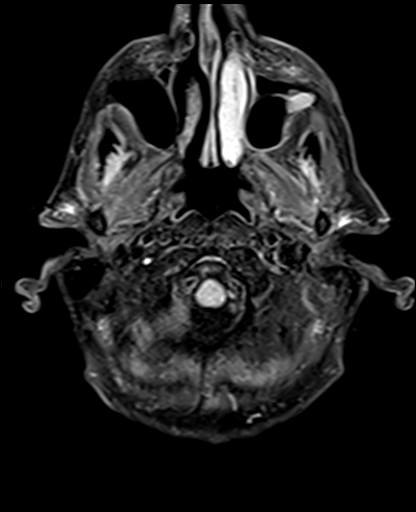
[im 25/25]
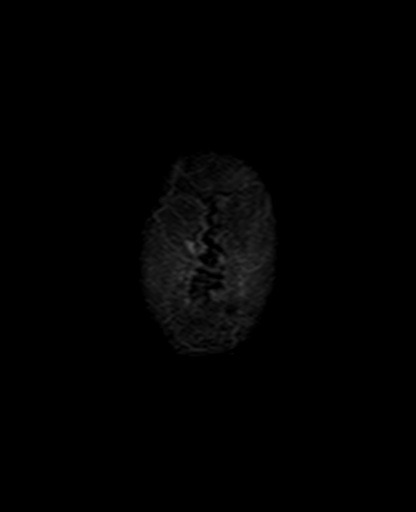

[Series 13: pha_images · axial · 3.0mm · 0.90mm/px · z∈[-54,+116]mm · 4 of 57 slices shown]
[im 1/57]
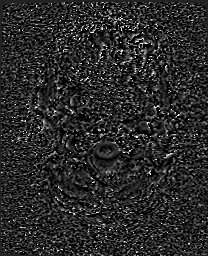
[im 19/57]
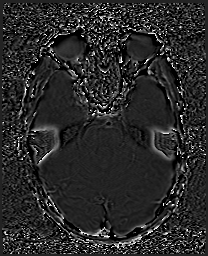
[im 38/57]
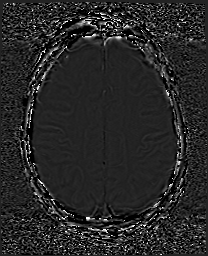
[im 57/57]
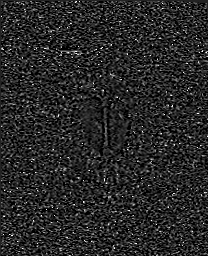

[Series 14: swi_images · axial · 3.0mm · 0.90mm/px · z∈[-57,+119]mm · 4 of 60 slices shown]
[im 1/60]
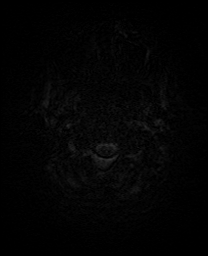
[im 20/60]
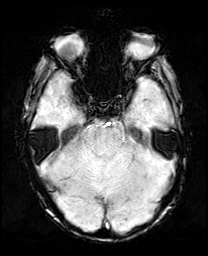
[im 40/60]
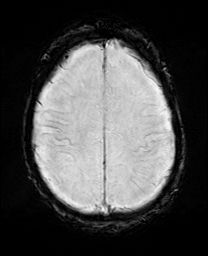
[im 60/60]
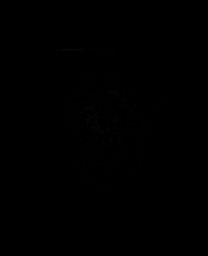

[Series 17: T2 post-contrast · coronal · 5.0mm · 0.72mm/px · 2 of 29 slices shown]
[im 1/29]
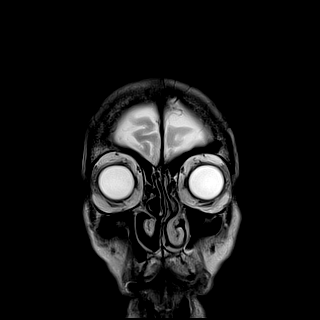
[im 29/29]
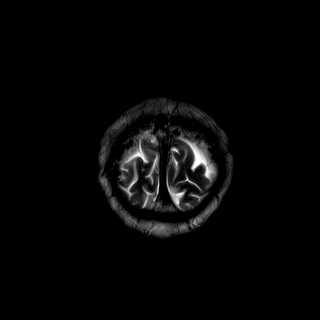

[Series 19: T1 post-contrast · coronal · 5.0mm · 0.34mm/px · 2 of 29 slices shown (1 of 3)]
[im 1/29]
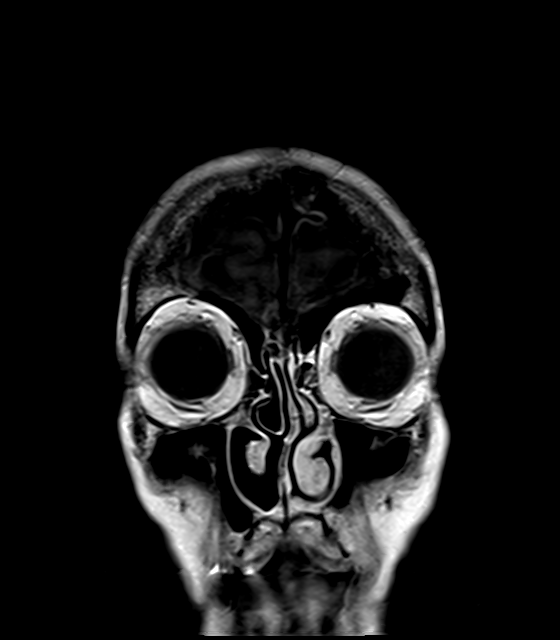
[im 29/29]
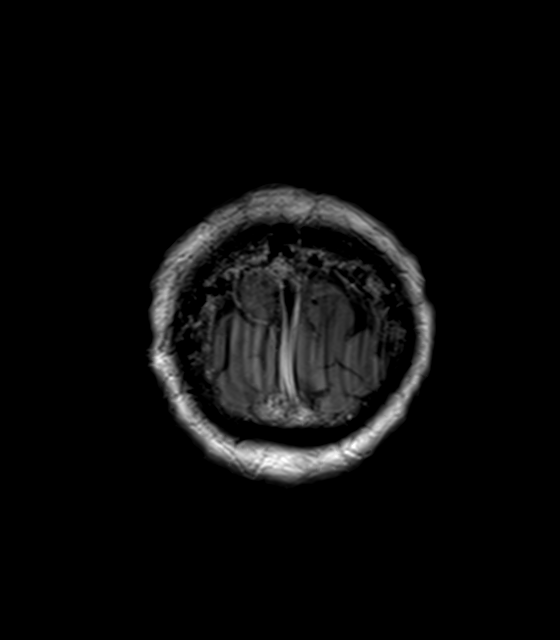

[Series 20: T1 post-contrast · sagittal · 5.0mm · 0.72mm/px · 2 of 25 slices shown (2 of 3)]
[im 1/25]
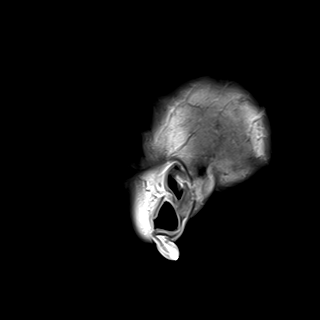
[im 25/25]
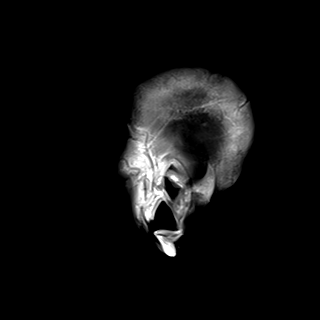

[Series 21: T1 post-contrast · sagittal · 5.0mm · 0.75mm/px · 2 of 25 slices shown (3 of 3)]
[im 1/25]
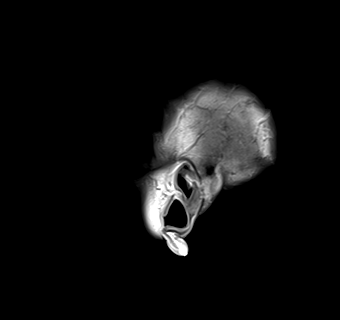
[im 25/25]
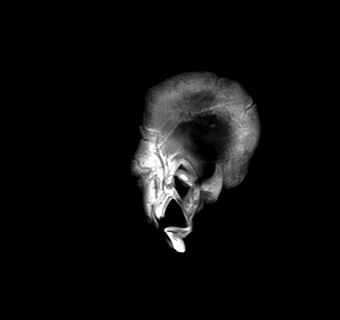

[40 of 48 positions shown; findings below may reference images not displayed]

FINDINGS: Brain: Mild diffuse dural enhancement remains. Focal dural
enhancement over the right frontal lobe measures 9 x 4 x 13 mm,
consistent with a small meningioma. A second meningioma is present
over the right parietal lobe measuring 13 x 8 x 11 mm. Enhancement
along the optic nerve is again noted. Assessment distal enhancement
in the orbit is limited without fat saturated images.

T1 shortening is again seen within the basal ganglia bilaterally. No
acute infarct, hemorrhage, or mass lesion is present. Atrophy is
within normal limits for age. The internal auditory canals are
within normal limits. The brainstem and cerebellum are within normal
limits.

Vascular: Flow is present in the major intracranial arteries.

Skull and upper cervical spine: The craniocervical junction is
normal. Upper cervical spine is within normal limits. Marrow signal
is unremarkable.

Sinuses/Orbits: Polyp or mucous retention cyst is noted laterally
within the left maxillary sinus. Remaining paranasal sinuses are
clear. There is some fluid in the mastoid air cells bilaterally.
Proximal optic nerve sheath enhancement is again noted. Bilateral
lens replacements are present. Globes and orbits are otherwise
within limits.
IMPRESSION: 1. Diffuse dural enhancement is stable to slightly decreased.
2. Stable appearance of the optic nerve sheath enhancement. This is
slightly improved, but remains concerning for arteritis.
3. Focal dural thickening over the right frontal lobe is most
consistent with a meningioma. A second meningioma is present over
the right parietal lobe.
4. Stable T1 shortening within the basal ganglia bilaterally.
Question hepatic disease.
5. Polyp or mucous retention cyst within the left maxillary sinus.
6. Minimal fluid in the mastoid air cells bilaterally. No
obstructing nasopharyngeal lesion is evident.

## 2021-07-10 IMAGING — DX DG CHEST 1V PORT
1 series · 1 of 1 positions shown · non-contrast
Comparison: 05/29/2019.  04/04/2019.

CLINICAL DATA: Chest pain.  Shortness of breath.

EXAM:
PORTABLE CHEST 1 VIEW

[chest]
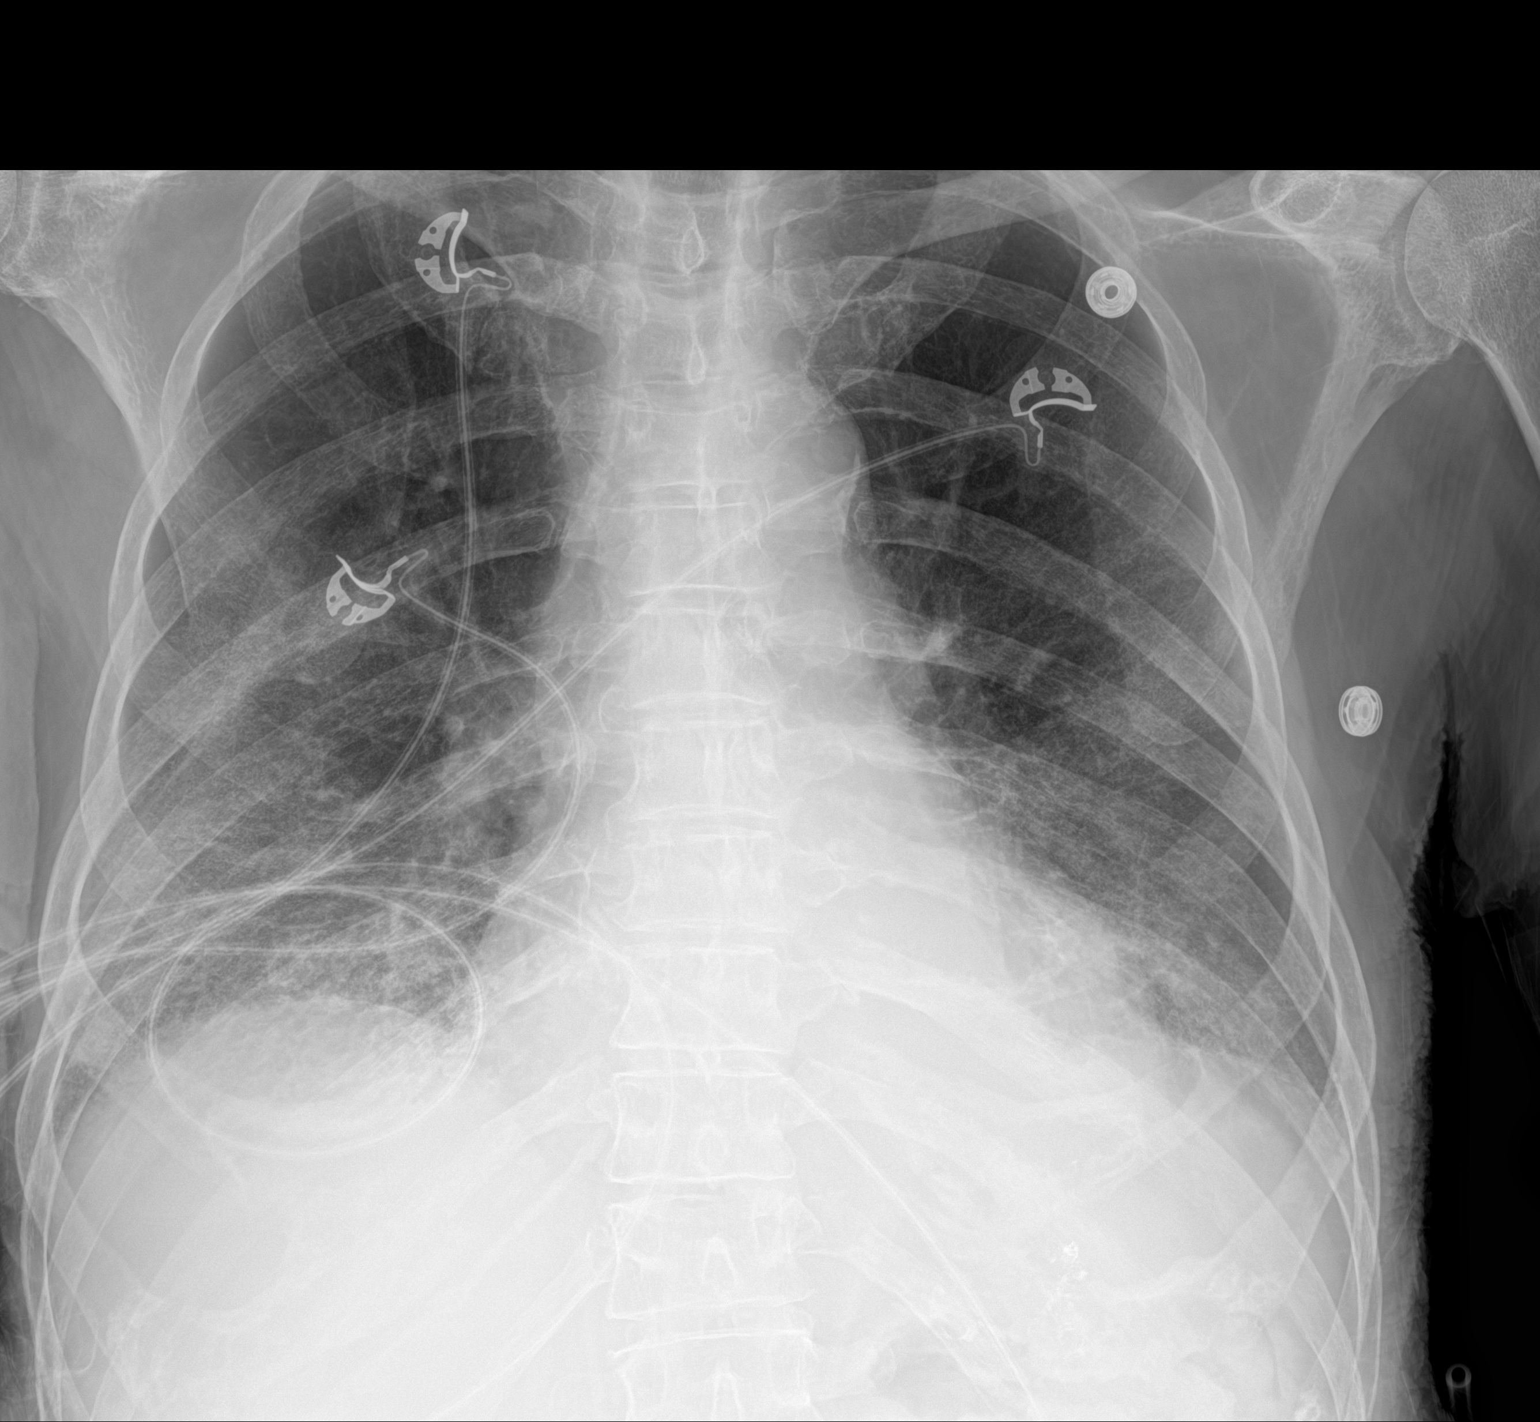

[1 of 1 positions shown; findings below may reference images not displayed]

FINDINGS: Cardiomegaly. Diffuse bilateral pulmonary interstitial prominence
and bilateral pleural effusions. Findings suggest CHF. Pneumonitis
cannot be excluded. Degenerative change thoracic spine.
IMPRESSION: Cardiomegaly with diffuse bilateral interstitial prominence and
bilateral pleural effusions suggesting CHF. Pneumonitis cannot be
excluded.
